# Patient Record
Sex: Female | Born: 1965 | Race: White | Hispanic: No | Marital: Married | Smoking: Never smoker
Health system: Southern US, Community
[De-identification: ages and names within clinical notes are randomized; demographics above are authoritative.]

## PROBLEM LIST (undated history)

## (undated) DIAGNOSIS — I200 Unstable angina: Secondary | ICD-10-CM

## (undated) DIAGNOSIS — J309 Allergic rhinitis, unspecified: Secondary | ICD-10-CM

## (undated) DIAGNOSIS — J45909 Unspecified asthma, uncomplicated: Secondary | ICD-10-CM

## (undated) DIAGNOSIS — E7800 Pure hypercholesterolemia, unspecified: Secondary | ICD-10-CM

## (undated) DIAGNOSIS — Z7982 Long term (current) use of aspirin: Secondary | ICD-10-CM

## (undated) DIAGNOSIS — I10 Essential (primary) hypertension: Secondary | ICD-10-CM

## (undated) DIAGNOSIS — C801 Malignant (primary) neoplasm, unspecified: Secondary | ICD-10-CM

## (undated) DIAGNOSIS — I2510 Atherosclerotic heart disease of native coronary artery without angina pectoris: Secondary | ICD-10-CM

## (undated) DIAGNOSIS — K589 Irritable bowel syndrome without diarrhea: Secondary | ICD-10-CM

## (undated) DIAGNOSIS — E119 Type 2 diabetes mellitus without complications: Secondary | ICD-10-CM

## (undated) DIAGNOSIS — T8859XA Other complications of anesthesia, initial encounter: Secondary | ICD-10-CM

## (undated) DIAGNOSIS — K219 Gastro-esophageal reflux disease without esophagitis: Secondary | ICD-10-CM

## (undated) DIAGNOSIS — Z7902 Long term (current) use of antithrombotics/antiplatelets: Secondary | ICD-10-CM

## (undated) DIAGNOSIS — Z9289 Personal history of other medical treatment: Secondary | ICD-10-CM

## (undated) HISTORY — PX: ESOPHAGOGASTRODUODENOSCOPY: SHX1529

## (undated) HISTORY — PX: CORONARY ANGIOPLASTY: SHX604

## (undated) HISTORY — PX: CARDIAC CATHETERIZATION: SHX172

## (undated) HISTORY — DX: Irritable bowel syndrome, unspecified: K58.9

## (undated) HISTORY — PX: DILATION AND CURETTAGE OF UTERUS: SHX78

## (undated) HISTORY — DX: Allergic rhinitis, unspecified: J30.9

## (undated) HISTORY — DX: Pure hypercholesterolemia, unspecified: E78.00

## (undated) HISTORY — DX: Type 2 diabetes mellitus without complications: E11.9

## (undated) HISTORY — DX: Personal history of other medical treatment: Z92.89

## (undated) HISTORY — DX: Unspecified asthma, uncomplicated: J45.909

---

## 1999-12-17 HISTORY — PX: COLONOSCOPY: SHX174

## 2009-02-09 ENCOUNTER — Emergency Department: Payer: Self-pay | Admitting: Emergency Medicine

## 2009-02-13 ENCOUNTER — Ambulatory Visit: Payer: Self-pay | Admitting: Cardiovascular Disease

## 2013-12-16 HISTORY — PX: EAR CYST EXCISION: SHX22

## 2014-02-21 ENCOUNTER — Ambulatory Visit: Payer: Self-pay | Admitting: Unknown Physician Specialty

## 2015-04-11 DIAGNOSIS — E7800 Pure hypercholesterolemia, unspecified: Secondary | ICD-10-CM | POA: Insufficient documentation

## 2015-04-11 DIAGNOSIS — I2510 Atherosclerotic heart disease of native coronary artery without angina pectoris: Secondary | ICD-10-CM | POA: Insufficient documentation

## 2015-04-11 DIAGNOSIS — J302 Other seasonal allergic rhinitis: Secondary | ICD-10-CM | POA: Insufficient documentation

## 2015-10-02 ENCOUNTER — Other Ambulatory Visit: Payer: Self-pay | Admitting: Family Medicine

## 2015-10-02 DIAGNOSIS — D739 Disease of spleen, unspecified: Principal | ICD-10-CM

## 2015-10-02 DIAGNOSIS — D7389 Other diseases of spleen: Secondary | ICD-10-CM

## 2015-10-05 ENCOUNTER — Ambulatory Visit
Admission: RE | Admit: 2015-10-05 | Discharge: 2015-10-05 | Disposition: A | Discharge status: Home / Self Care | Payer: BC Managed Care – PPO | Source: Ambulatory Visit | Attending: Family Medicine | Admitting: Family Medicine

## 2015-10-05 DIAGNOSIS — D739 Disease of spleen, unspecified: Secondary | ICD-10-CM | POA: Insufficient documentation

## 2015-10-05 DIAGNOSIS — D7389 Other diseases of spleen: Secondary | ICD-10-CM

## 2015-10-05 MED ORDER — IOHEXOL 300 MG/ML  SOLN
100.0000 mL | Freq: Once | INTRAMUSCULAR | Status: AC | PRN
  Administered 2015-10-05: 100 mL via INTRAVENOUS

## 2016-02-22 ENCOUNTER — Encounter: Payer: Self-pay | Admitting: *Deleted

## 2016-02-22 ENCOUNTER — Other Ambulatory Visit: Payer: BC Managed Care – PPO

## 2016-02-22 NOTE — Patient Instructions (Signed)
  Your procedure is scheduled on: 02-29-16 (THURSDAY) Report to [place] To find out your arrival time please call [phone] between 1PM - 3PM on 02-29-16 [facility])  Remember: Instructions that are not followed completely may result in serious medical risk, up to and including death, or upon the discretion of your surgeon and anesthesiologist your surgery may need to be rescheduled.    _X___ 1. Do not eat food or drink liquids after midnight. No gum chewing or hard candies.     _X___ 2. No Alcohol for 24 hours before or after surgery.   ____ 3. Bring all medications with you on the day of surgery if instructed.    _X___ 4. Notify your doctor if there is any change in your medical condition     (cold, fever, infections).     Do not wear jewelry, make-up, hairpins, clips or nail polish.  Do not wear lotions, powders, or perfumes. You may wear deodorant.  Do not shave 48 hours prior to surgery. Men may shave face and neck.  Do not bring valuables to the hospital.    [facility] is not responsible for any belongings or valuables.               Contacts, dentures or bridgework may not be worn into surgery.  Leave your suitcase in the car. After surgery it may be brought to your room.  For patients admitted to the hospital, discharge time is determined by your treatment team.   Patients discharged the day of surgery will not be allowed to drive home.   Please read over the following fact sheets that you were given:     _X___ Take these medicines the morning of surgery with A SIP OF WATER:    1. PRILOSEC (OMEPRAZOLE)  2. TAKE A PRILOSEC ON Wednesday NIGHT(02-28-16) BEFORE BED  3.   4.  5.  6.  ____ Fleet Enema (as directed)   ____ Use CHG Soap as directed  ____ Use inhalers on the day of surgery  ____ Stop metformin 2 days prior to surgery    ____ Take 1/2 of usual insulin dose the night before surgery and none on the morning of surgery.    ____ Stop Coumadin/Plavix/aspirin-ASK DR Earlyne Iba KHAN ABOUT ASPIRIN   ____ Stop Anti-inflammatories-NO NSAIDS OR ASPIRIN PRODUCTS-TYLENOL OK TO TAKE   __X__ Stop supplements until after surgery-STOP FISH OIL NOW   ____ Bring C-Pap to the hospital.

## 2016-02-27 ENCOUNTER — Encounter
Admission: RE | Admit: 2016-02-27 | Discharge: 2016-02-27 | Disposition: A | Discharge status: Home / Self Care | Payer: BC Managed Care – PPO | Source: Ambulatory Visit | Attending: Obstetrics & Gynecology | Admitting: Obstetrics & Gynecology

## 2016-02-27 DIAGNOSIS — I2510 Atherosclerotic heart disease of native coronary artery without angina pectoris: Secondary | ICD-10-CM | POA: Diagnosis not present

## 2016-02-27 DIAGNOSIS — Z883 Allergy status to other anti-infective agents status: Secondary | ICD-10-CM | POA: Diagnosis not present

## 2016-02-27 DIAGNOSIS — N938 Other specified abnormal uterine and vaginal bleeding: Secondary | ICD-10-CM | POA: Diagnosis present

## 2016-02-27 DIAGNOSIS — Z79899 Other long term (current) drug therapy: Secondary | ICD-10-CM | POA: Diagnosis not present

## 2016-02-27 DIAGNOSIS — J45909 Unspecified asthma, uncomplicated: Secondary | ICD-10-CM | POA: Diagnosis not present

## 2016-02-27 DIAGNOSIS — Z30430 Encounter for insertion of intrauterine contraceptive device: Secondary | ICD-10-CM | POA: Diagnosis not present

## 2016-02-27 DIAGNOSIS — I10 Essential (primary) hypertension: Secondary | ICD-10-CM | POA: Diagnosis not present

## 2016-02-27 DIAGNOSIS — E7800 Pure hypercholesterolemia, unspecified: Secondary | ICD-10-CM | POA: Diagnosis not present

## 2016-02-27 DIAGNOSIS — K219 Gastro-esophageal reflux disease without esophagitis: Secondary | ICD-10-CM | POA: Diagnosis not present

## 2016-02-27 DIAGNOSIS — N840 Polyp of corpus uteri: Secondary | ICD-10-CM | POA: Diagnosis not present

## 2016-02-27 DIAGNOSIS — K589 Irritable bowel syndrome without diarrhea: Secondary | ICD-10-CM | POA: Diagnosis not present

## 2016-02-27 DIAGNOSIS — Z7982 Long term (current) use of aspirin: Secondary | ICD-10-CM | POA: Diagnosis not present

## 2016-02-27 LAB — BASIC METABOLIC PANEL
Anion gap: 8 (ref 5–15)
BUN: 8 mg/dL (ref 6–20)
CALCIUM: 8.7 mg/dL — AB (ref 8.9–10.3)
CO2: 20 mmol/L — AB (ref 22–32)
CREATININE: 0.56 mg/dL (ref 0.44–1.00)
Chloride: 108 mmol/L (ref 101–111)
GFR calc non Af Amer: >60 mL/min (ref >60)
Glucose, Bld: 89 mg/dL (ref 65–99)
Potassium: 3.3 mmol/L — ABNORMAL LOW (ref 3.5–5.1)
Sodium: 136 mmol/L (ref 135–145)

## 2016-02-27 LAB — CBC
HCT: 38.3 % (ref 35.0–47.0)
Hemoglobin: 12.9 g/dL (ref 12.0–16.0)
MCH: 26.9 pg (ref 26.0–34.0)
MCHC: 33.7 g/dL (ref 32.0–36.0)
MCV: 79.8 fL — ABNORMAL LOW (ref 80.0–100.0)
Platelets: 237 10*3/uL (ref 150–440)
RBC: 4.79 MIL/uL (ref 3.80–5.20)
RDW: 15.5 % — ABNORMAL HIGH (ref 11.5–14.5)
WBC: 6.3 10*3/uL (ref 3.6–11.0)

## 2016-02-27 LAB — TYPE AND SCREEN
ABO/RH(D): A NEG
Antibody Screen: NEGATIVE

## 2016-02-27 LAB — ABO/RH: ABO/RH(D): A NEG

## 2016-02-28 ENCOUNTER — Encounter: Payer: Self-pay | Admitting: *Deleted

## 2016-02-28 NOTE — Pre-Procedure Instructions (Signed)
FAXED AND CALLED NANCY AT DR WARDS OFFICE LEFT MESSAGE ON VM STATING THAT PT HAD A LOW K+3.3 AND THAT PT NEEDS A SUPPLEMENT-RECEIVED FAX CONFIRMATION THAT IT WAS RECEIVED

## 2016-02-28 NOTE — Pre-Procedure Instructions (Signed)
REQUESTED EKG/ STRESS/ECH AND ANY OTHER CARDIAC TESTS DONE FROM DR Georgia Dom OFFICE- STRESS/ECHO AND NOTE ON CHART

## 2016-02-28 NOTE — Pre-Procedure Instructions (Signed)
DR WARDS OFFICE SENT OVER CARDIAC CLEARANCE NOTE FROM DR Earlyne Iba KHAN-ON CHART-LOW RISK

## 2016-02-28 NOTE — Pre-Procedure Instructions (Signed)
DR WARD CALLED ME AND ASKED MY WHY I WAS FAXING OVER A POTASSIUM RESULT THAT WAS BARELY LOW AND SAYING PT NEEDED A SUPPLEMENT. I TOLD HER THAT WE FAX OVER ANY ABNORMAL K+ THAT IS LESS THAN 3.4 AND MOST SURGEONS ORDER A SUPPLEMENT TO ENSURE THE K+ IS NOT LOW THE MORNING OF SURGERY.  DR WARD SAID SHE WILL CALL PT AND TELL HER TO EAT FOODS TODAY THAT ARE [place] IN K+. I TOLD HER WE WILL RECHECK K+ IN AM.

## 2016-02-29 ENCOUNTER — Encounter
Admission: RE | Disposition: A | Discharge status: Home / Self Care | Payer: Self-pay | Source: Ambulatory Visit | Attending: Obstetrics & Gynecology

## 2016-02-29 ENCOUNTER — Ambulatory Visit
Admission: RE | Admit: 2016-02-29 | Discharge: 2016-02-29 | Disposition: A | Discharge status: Home / Self Care | Payer: BC Managed Care – PPO | Source: Ambulatory Visit | Attending: Obstetrics & Gynecology | Admitting: Obstetrics & Gynecology

## 2016-02-29 ENCOUNTER — Ambulatory Visit: Payer: BC Managed Care – PPO | Admitting: Certified Registered"

## 2016-02-29 DIAGNOSIS — Z883 Allergy status to other anti-infective agents status: Secondary | ICD-10-CM | POA: Insufficient documentation

## 2016-02-29 DIAGNOSIS — J45909 Unspecified asthma, uncomplicated: Secondary | ICD-10-CM | POA: Insufficient documentation

## 2016-02-29 DIAGNOSIS — N840 Polyp of corpus uteri: Secondary | ICD-10-CM | POA: Insufficient documentation

## 2016-02-29 DIAGNOSIS — I10 Essential (primary) hypertension: Secondary | ICD-10-CM | POA: Insufficient documentation

## 2016-02-29 DIAGNOSIS — K219 Gastro-esophageal reflux disease without esophagitis: Secondary | ICD-10-CM | POA: Insufficient documentation

## 2016-02-29 DIAGNOSIS — N938 Other specified abnormal uterine and vaginal bleeding: Secondary | ICD-10-CM | POA: Insufficient documentation

## 2016-02-29 DIAGNOSIS — Z79899 Other long term (current) drug therapy: Secondary | ICD-10-CM | POA: Insufficient documentation

## 2016-02-29 DIAGNOSIS — K589 Irritable bowel syndrome without diarrhea: Secondary | ICD-10-CM | POA: Insufficient documentation

## 2016-02-29 DIAGNOSIS — E7800 Pure hypercholesterolemia, unspecified: Secondary | ICD-10-CM | POA: Insufficient documentation

## 2016-02-29 DIAGNOSIS — Z7982 Long term (current) use of aspirin: Secondary | ICD-10-CM | POA: Insufficient documentation

## 2016-02-29 DIAGNOSIS — I2510 Atherosclerotic heart disease of native coronary artery without angina pectoris: Secondary | ICD-10-CM | POA: Insufficient documentation

## 2016-02-29 DIAGNOSIS — Z30430 Encounter for insertion of intrauterine contraceptive device: Secondary | ICD-10-CM | POA: Insufficient documentation

## 2016-02-29 HISTORY — PX: DILATATION & CURETTAGE/HYSTEROSCOPY WITH MYOSURE: SHX6511

## 2016-02-29 HISTORY — DX: Atherosclerotic heart disease of native coronary artery without angina pectoris: I25.10

## 2016-02-29 HISTORY — DX: Gastro-esophageal reflux disease without esophagitis: K21.9

## 2016-02-29 HISTORY — DX: Essential (primary) hypertension: I10

## 2016-02-29 HISTORY — PX: INTRAUTERINE DEVICE (IUD) INSERTION: SHX5877

## 2016-02-29 LAB — POCT PREGNANCY, URINE: PREG TEST UR: NEGATIVE

## 2016-02-29 LAB — I-STAT 4 (NA, K, GLUC, HGB, HCT): POTASSIUM: 3.7 mmol/L

## 2016-02-29 SURGERY — DILATATION & CURETTAGE/HYSTEROSCOPY WITH MYOSURE
Anesthesia: General

## 2016-02-29 MED ORDER — LIDOCAINE HCL (CARDIAC) 20 MG/ML IV SOLN
INTRAVENOUS | Status: DC | PRN
  Administered 2016-02-29: 80 mg via INTRAVENOUS

## 2016-02-29 MED ORDER — ACETAMINOPHEN 10 MG/ML IV SOLN
INTRAVENOUS | Status: DC | PRN
  Administered 2016-02-29: 1000 mg via INTRAVENOUS

## 2016-02-29 MED ORDER — ONDANSETRON HCL 4 MG/2ML IJ SOLN: 4.0000 mg | Freq: Once | INTRAMUSCULAR | Status: DC | PRN

## 2016-02-29 MED ORDER — HYDRALAZINE HCL 20 MG/ML IJ SOLN
10.0000 mg | Freq: Once | INTRAMUSCULAR | Status: AC
  Administered 2016-02-29: 10 mg via INTRAVENOUS

## 2016-02-29 MED ORDER — DEXAMETHASONE SODIUM PHOSPHATE 10 MG/ML IJ SOLN
INTRAMUSCULAR | Status: DC | PRN
  Administered 2016-02-29: 4 mg via INTRAVENOUS

## 2016-02-29 MED ORDER — HYDRALAZINE HCL 20 MG/ML IJ SOLN
INTRAMUSCULAR | Status: AC
  Filled 2016-02-29: qty 1

## 2016-02-29 MED ORDER — LACTATED RINGERS IV SOLN
INTRAVENOUS | Status: DC
  Administered 2016-02-29 (×2): via INTRAVENOUS

## 2016-02-29 MED ORDER — PROPOFOL 10 MG/ML IV BOLUS
INTRAVENOUS | Status: DC | PRN
  Administered 2016-02-29: 150 mg via INTRAVENOUS

## 2016-02-29 MED ORDER — FENTANYL CITRATE (PF) 100 MCG/2ML IJ SOLN: 25.0000 ug | INTRAMUSCULAR | Status: DC | PRN

## 2016-02-29 MED ORDER — FENTANYL CITRATE (PF) 100 MCG/2ML IJ SOLN
INTRAMUSCULAR | Status: DC | PRN
  Administered 2016-02-29 (×2): 50 ug via INTRAVENOUS

## 2016-02-29 MED ORDER — MORPHINE SULFATE (PF) 2 MG/ML IV SOLN: 1.0000 mg | INTRAVENOUS | Status: DC | PRN

## 2016-02-29 MED ORDER — ACETAMINOPHEN 650 MG RE SUPP: 650.0000 mg | RECTAL | Status: DC | PRN

## 2016-02-29 MED ORDER — IBUPROFEN 600 MG PO TABS: 600.0000 mg | ORAL_TABLET | Freq: Four times a day (QID) | ORAL | Status: AC | PRN

## 2016-02-29 MED ORDER — MIDAZOLAM HCL 2 MG/2ML IJ SOLN
INTRAMUSCULAR | Status: DC | PRN
  Administered 2016-02-29: 2 mg via INTRAVENOUS

## 2016-02-29 MED ORDER — ACETAMINOPHEN 325 MG PO TABS: 650.0000 mg | ORAL_TABLET | ORAL | Status: DC | PRN

## 2016-02-29 MED ORDER — LABETALOL HCL 5 MG/ML IV SOLN
10.0000 mg | Freq: Once | INTRAVENOUS | Status: AC
  Administered 2016-02-29: 10 mg via INTRAVENOUS

## 2016-02-29 MED ORDER — LABETALOL HCL 5 MG/ML IV SOLN
INTRAVENOUS | Status: AC
  Filled 2016-02-29: qty 4

## 2016-02-29 MED ORDER — ACETAMINOPHEN 10 MG/ML IV SOLN
INTRAVENOUS | Status: AC
  Filled 2016-02-29: qty 100

## 2016-02-29 MED ORDER — ONDANSETRON HCL 4 MG/2ML IJ SOLN
INTRAMUSCULAR | Status: DC | PRN
  Administered 2016-02-29: 4 mg via INTRAVENOUS

## 2016-02-29 MED ORDER — KETOROLAC TROMETHAMINE 30 MG/ML IJ SOLN: 30.0000 mg | Freq: Four times a day (QID) | INTRAMUSCULAR | Status: DC

## 2016-02-29 MED ORDER — CHLORHEXIDINE GLUCONATE 4 % EX LIQD: Freq: Once | CUTANEOUS | Status: DC

## 2016-02-29 SURGICAL SUPPLY — 24 items
ABLATOR ENDOMETRIAL MYOSURE (ABLATOR) IMPLANT
CANISTER SUC SOCK COL 7IN (MISCELLANEOUS) ×3 IMPLANT
CATH ROBINSON RED A/P 16FR (CATHETERS) ×3 IMPLANT
ELECT REM PT RETURN 9FT ADLT (ELECTROSURGICAL)
ELECTRODE REM PT RTRN 9FT ADLT (ELECTROSURGICAL) IMPLANT
GLOVE BIOGEL PI IND STRL 6.5 (GLOVE) ×1 IMPLANT
GLOVE BIOGEL PI INDICATOR 6.5 (GLOVE) ×2
GLOVE PI ORTHOPRO 6.5 (GLOVE) ×2
GLOVE PI ORTHOPRO STRL 6.5 (GLOVE) ×1 IMPLANT
GLOVE SURG SYN 6.5 ES PF (GLOVE) ×6 IMPLANT
GOWN STRL REUS W/ TWL LRG LVL3 (GOWN DISPOSABLE) ×2 IMPLANT
GOWN STRL REUS W/TWL LRG LVL3 (GOWN DISPOSABLE) ×4
KIT RM TURNOVER CYSTO AR (KITS) ×3 IMPLANT
MYOSURE LITE POLYP REMOVAL (MISCELLANEOUS) IMPLANT
PACK DNC HYST (MISCELLANEOUS) ×3 IMPLANT
PAD OB MATERNITY 4.3X12.25 (PERSONAL CARE ITEMS) ×3 IMPLANT
PAD PREP 24X41 OB/GYN DISP (PERSONAL CARE ITEMS) ×3 IMPLANT
SEAL ROD LENS SCOPE MYOSURE (ABLATOR) ×3 IMPLANT
SOL .9 NS 3000ML IRR  AL (IV SOLUTION) ×2
SOL .9 NS 3000ML IRR UROMATIC (IV SOLUTION) ×1 IMPLANT
TOWEL OR 17X26 4PK STRL BLUE (TOWEL DISPOSABLE) ×3 IMPLANT
TUBING CONNECTING 10 (TUBING) ×2 IMPLANT
TUBING CONNECTING 10' (TUBING) ×1
TUBING HYSTEROSCOPY DOLPHIN (MISCELLANEOUS) IMPLANT

## 2016-02-29 NOTE — Discharge Instructions (Signed)
You should expect to have some cramping and vaginal bleeding for about a week. This should taper off and subside, much like a period. If heavy bleeding continues or gets worse, you should contact the office for an earlier appointment.  Use Ibuprofen and a heating pad for cramping relief.   Please call the office or physician on call for fever >101, severe pain, and heavy bleeding.   [phone]  NOTHING IN THE VAGINA FOR 2 WEEKS!!

## 2016-02-29 NOTE — H&P (Signed)
H&P Update  PLEASE SEE PAPER H&P  Pt was last seen in my office, and complete history and physical performed.  The surgical history has been reviewed and remains accurate without interval change. The patient was re-examined and patient's physiologic condition has not changed significantly in the last 30 days.  No new pharmacological allergies or types of therapy has been initiated. She has received clearance from her cardiologist for this procedure.  Allergies  Allergen Reactions  . Neosporin Wound Cleanser [Benzalkonium Chloride] Rash    Past Medical History  Diagnosis Date  . Hypertension   . GERD (gastroesophageal reflux disease)   . Coronary artery disease    Past Surgical History  Procedure Laterality Date  . Ear cyst excision      IN OFFICE  . Colonoscopy    . Esophagogastroduodenoscopy      BP 152/96 mmHg  Pulse 82  Temp(Src) 97.4 F (36.3 C) (Oral)  Resp 16  Ht 5' 4.5" (1.638 m)  Wt 75.297 kg (166 lb)  BMI 28.06 kg/m2  SpO2 98%  LMP 01/31/2016 (Approximate)  NAD RRR no murmurs CTAB, no wheezing, resps unlabored +BS, soft, NTTP No c/c/e Pelvic exam deferred  The above history was confirmed with the patient. The condition still exists that makes this procedure necessary. Surgical plan includes Hysteroscopy, Dilation and Curettage, Polypectomy, and Insertion of Intrauterine Device, as confirmed on the consent. The treatment plan remains the same, without new options for care.  The patient understands the potential benefits and risks and the consents have been signed and placed on the chart.     Larey Days, MD Attending Obstetrician Gynecologist [facility]

## 2016-02-29 NOTE — Op Note (Signed)
Operative Report Hysteroscopy, Dilation and Curettage 02/29/2016  Patient:  Zoe [name]  50 y.o. female Preoperative diagnosis:  ENDOMETRIAL POLYP,ABNORMAL UTERINE BLEEDING,CONTRACEPTION Postoperative diagnosis:  ENDOMETRIAL POLYP, ABNORMAL UTERINE BLEEDING, CONTRACEPTION  PROCEDURE:  Procedure(s): DILATATION & CURETTAGE/HYSTEROSCOPY WITH MYOSURE (N/A) INTRAUTERINE DEVICE (IUD) INSERTION (N/A)  POLYPECTOMY Surgeon:  Surgeon(s) and Role:    * Chelsea Loletha Grayer Ward, MD - Primary Assistant: N/A Anesthesia:  LMA I/O: Total I/O In: 500 [I.V.:500] Out: - MINIMAL . Specimens:  Endometrial curettings, Endometrial polyp Complications: None Apparent Disposition:  VS stable to PACU  Findings: Findings:  Small retroflexed uterus sounding to 9cm, mobile, no adnexal masses, ectropion on posterior lip of cervix, endometrial polyps  Indication for procedure/Consents: 50 y.o.  here for scheduled surgery for the aforementioned diagnoses. Risks of surgery were discussed with the patient including but not limited to: bleeding which may require transfusion; infection which may require antibiotics; injury to uterus or surrounding organs; intrauterine scarring which may impair future fertility; need for additional procedures including laparotomy or laparoscopy; and other postoperative/anesthesia complications. Written informed consent was obtained.    Procedure Details:   The patient was then taken to the operating room where anesthesia was administered.  After a formal timeout was performed, she was placed in the dorsal lithotomy position and examined with the above findings. She was then prepped and draped in the sterile manner.  A speculum was then placed in the patient's vagina and a single tooth tenaculum was applied to the anterior lip of the cervix.  The uterus was sounded to 9cm. Her cervix was serially dilated to accommodate the myoscope with findings as above. Forceps were inserted and a large, broad  based polyp was removed.  A sharp curettage was then performed until there was a gritty texture in all four quadrants. The specimens were handed off to nursing.  The camera was reinserted and confirmed the uterus had been evacuated.   The [place] IUD insertion apparatus was then placed in the uterus to the fundus and the IUD was inserted without difficulty using proper technique.  The strings were cut to 2cm out of the cervix.  The tenaculum was removed from the anterior lip of the cervix and the vaginal speculum was removed after noting good hemostasis. The patient tolerated the procedure well and was taken to the recovery area awake, extubated and in stable condition.  The patient will be discharged to home as per PACU criteria.  Routine postoperative instructions given. She will follow up in the clinic in four weeks for postoperative evaluation.  Larey Days, MD [facility] OBGYN Attending Gynecologist

## 2016-02-29 NOTE — Progress Notes (Signed)
Hydralazine 10mg  given for blood pressure of 163/107

## 2016-02-29 NOTE — Transfer of Care (Signed)
Immediate Anesthesia Transfer of Care Note  Patient: Zoe [name]  Procedure(s) Performed: Procedure(s): DILATATION & CURETTAGE/HYSTEROSCOPY WITH MYOSURE (N/A) INTRAUTERINE DEVICE (IUD) INSERTION (N/A)  Patient Location: PACU  Anesthesia Type:General  Level of Consciousness: sedated  Airway & Oxygen Therapy: Patient Spontanous Breathing and Patient connected to face mask oxygen  Post-op Assessment: Report given to RN and Post -op Vital signs reviewed and stable  Post vital signs: Reviewed and stable  Last Vitals:  Filed Vitals:   02/29/16 1046  BP: 152/96  Pulse: 82  Temp: 36.3 C  Resp: 16    Complications: No apparent anesthesia complications

## 2016-02-29 NOTE — Progress Notes (Signed)
Labetalol 10mg  given for blood pressure of 164/103

## 2016-02-29 NOTE — Progress Notes (Signed)
Blood pressure 163/97  Dr Kayleen Memos aware no new orders

## 2016-02-29 NOTE — Anesthesia Procedure Notes (Signed)
Procedure Name: LMA Insertion Performed by: Trenton Verne Pre-anesthesia Checklist: Patient identified, Emergency Drugs available, Suction available, Patient being monitored and Timeout performed Patient Re-evaluated:Patient Re-evaluated prior to inductionOxygen Delivery Method: Circle system utilized Preoxygenation: Pre-oxygenation with 100% oxygen Intubation Type: IV induction Ventilation: Mask ventilation without difficulty LMA: LMA inserted LMA Size: 3.5 Number of attempts: 1 Tube secured with: Tape Dental Injury: Teeth and Oropharynx as per pre-operative assessment      

## 2016-02-29 NOTE — Anesthesia Preprocedure Evaluation (Signed)
Anesthesia Evaluation  Patient identified by MRN, date of birth, ID band Patient awake    Reviewed: Allergy & Precautions, H&P , NPO status , Patient's Chart, lab work & pertinent test results, reviewed documented beta blocker date and time   Airway Mallampati: III  TM Distance: >3 FB Neck ROM: full    Dental  (+) Teeth Intact   Pulmonary neg pulmonary ROS,    Pulmonary exam normal        Cardiovascular Exercise Tolerance: Good hypertension, (-) angina+ CAD  (-) Past MI, (-) Cardiac Stents, (-) CHF, (-) PND and (-) DOE negative cardio ROS Normal cardiovascular exam(-) dysrhythmias  Rate:Normal  Single vessel dz on cath.  Good exercise tolerance.  Hiked 2 miles recently.Marland KitchenMarland KitchenOn meds.  JA   Neuro/Psych negative neurological ROS  negative psych ROS   GI/Hepatic negative GI ROS, Neg liver ROS,   Endo/Other  negative endocrine ROS  Renal/GU negative Renal ROS  negative genitourinary   Musculoskeletal   Abdominal   Peds  Hematology negative hematology ROS (+)   Anesthesia Other Findings   Reproductive/Obstetrics negative OB ROS                             Anesthesia Physical Anesthesia Plan  ASA: III  Anesthesia Plan: General LMA   Post-op Pain Management:    Induction:   Airway Management Planned:   Additional Equipment:   Intra-op Plan:   Post-operative Plan:   Informed Consent: I have reviewed the patients History and Physical, chart, labs and discussed the procedure including the risks, benefits and alternatives for the proposed anesthesia with the patient or authorized representative who has indicated his/her understanding and acceptance.     Plan Discussed with: CRNA  Anesthesia Plan Comments:         Anesthesia Quick Evaluation

## 2016-02-29 NOTE — OR Nursing (Signed)
Liletta IUD [id number] Exp:  08/2019

## 2016-03-01 ENCOUNTER — Encounter: Payer: Self-pay | Admitting: Obstetrics & Gynecology

## 2016-03-01 LAB — SURGICAL PATHOLOGY

## 2016-03-01 NOTE — Anesthesia Postprocedure Evaluation (Signed)
Anesthesia Post Note  Patient: Steva Mulanax  Procedure(s) Performed: Procedure(s) (LRB): DILATATION & CURETTAGE/HYSTEROSCOPY WITH MYOSURE (N/A) INTRAUTERINE DEVICE (IUD) INSERTION (N/A)  Patient location during evaluation: PACU Anesthesia Type: General Level of consciousness: awake and alert and oriented Pain management: pain level controlled Vital Signs Assessment: post-procedure vital signs reviewed and stable Respiratory status: spontaneous breathing Cardiovascular status: blood pressure returned to baseline Anesthetic complications: no    Last Vitals:  Filed Vitals:   02/29/16 1410 02/29/16 1430  BP: 163/97 141/78  Pulse: 76 74  Temp: 36.8 C   Resp: 18 16    Last Pain: There were no vitals filed for this visit.               Danilynn Jemison

## 2017-07-07 ENCOUNTER — Ambulatory Visit (INDEPENDENT_AMBULATORY_CARE_PROVIDER_SITE_OTHER): Payer: BC Managed Care – PPO | Admitting: Certified Nurse Midwife

## 2017-07-07 ENCOUNTER — Encounter: Payer: Self-pay | Admitting: Certified Nurse Midwife

## 2017-07-07 VITALS — BP 110/70 | HR 70 | Ht 64.0 in | Wt 169.0 lb

## 2017-07-07 DIAGNOSIS — Z1231 Encounter for screening mammogram for malignant neoplasm of breast: Secondary | ICD-10-CM

## 2017-07-07 DIAGNOSIS — Z124 Encounter for screening for malignant neoplasm of cervix: Secondary | ICD-10-CM | POA: Diagnosis not present

## 2017-07-07 DIAGNOSIS — Z1211 Encounter for screening for malignant neoplasm of colon: Secondary | ICD-10-CM

## 2017-07-07 DIAGNOSIS — Z01419 Encounter for gynecological examination (general) (routine) without abnormal findings: Secondary | ICD-10-CM

## 2017-07-07 DIAGNOSIS — K589 Irritable bowel syndrome without diarrhea: Secondary | ICD-10-CM | POA: Insufficient documentation

## 2017-07-07 DIAGNOSIS — E7800 Pure hypercholesterolemia, unspecified: Secondary | ICD-10-CM | POA: Insufficient documentation

## 2017-07-07 DIAGNOSIS — I10 Essential (primary) hypertension: Secondary | ICD-10-CM | POA: Insufficient documentation

## 2017-07-07 DIAGNOSIS — K219 Gastro-esophageal reflux disease without esophagitis: Secondary | ICD-10-CM | POA: Insufficient documentation

## 2017-07-07 DIAGNOSIS — J309 Allergic rhinitis, unspecified: Secondary | ICD-10-CM | POA: Insufficient documentation

## 2017-07-07 DIAGNOSIS — E1159 Type 2 diabetes mellitus with other circulatory complications: Secondary | ICD-10-CM | POA: Insufficient documentation

## 2017-07-07 DIAGNOSIS — Z1239 Encounter for other screening for malignant neoplasm of breast: Secondary | ICD-10-CM

## 2017-07-07 DIAGNOSIS — I2510 Atherosclerotic heart disease of native coronary artery without angina pectoris: Secondary | ICD-10-CM | POA: Insufficient documentation

## 2017-07-07 LAB — HEMOCCULT GUIAC POC 1CARD (OFFICE): Fecal Occult Blood, POC: NEGATIVE

## 2017-07-07 NOTE — Progress Notes (Signed)
Gynecology Annual Exam  PCP: Derinda Late, MD  Chief Complaint:  Chief Complaint  Patient presents with  . Gynecologic Exam    History of Present Illness:Zoe [name] is a 51 year old Caucasian/White female, G2 P2002, who presents for her annual exam. She is not having any significant gyn concerns. She is amenorrheic on the  [place] IUD inserted 316/17. She had a hysteroscopy/ D&C for menorrhagia and endometrial polyps. The IUD was inserted at that time.     The patient's past medical history is coronary artery disease, hyperlipidemia, and GERD. She sees Dr Laurelyn Sickle q58mos and Zoe [name] is her PCP.  Since her last annual GYN exam dated 08/25/2015, she has not had any significant changes in her health. Her antihypertensive medications have been changed for better blood pressure control.  She is sexually active. She is currently using the [place] IUD for contraception. She has no dyspareunia. Her most recent pap smear was obtained 08/25/2015 and was negative.  Her most recent mammogram obtained on 08/25/2015 was normal.  There is no family history of breast cancer.  There is no family history of ovarian cancer.  The patient does not do monthly self breast exams.  The patient does not smoke.  The patient does drink alcohol occasionally. The patient does not use illegal drugs.  The patient exercises by walking 45 minutes three times a week. The patient does get adequate calcium in her diet.  She had a normal lipid panel in 2018.  The patient denies current symptoms of depression.    Review of Systems: Review of Systems  Constitutional: Negative for chills, fever and weight loss.  HENT: Negative for congestion, sinus pain and sore throat.   Eyes: Negative for blurred vision and pain.  Respiratory: Negative for hemoptysis, shortness of breath and wheezing.   Cardiovascular: Negative for chest pain, palpitations and leg swelling.  Gastrointestinal: Negative for abdominal  pain, blood in stool, diarrhea, heartburn, nausea and vomiting.  Genitourinary: Negative for dysuria, frequency, hematuria and urgency.  Musculoskeletal: Negative for back pain, joint pain and myalgias.  Skin: Negative for itching and rash.  Neurological: Negative for dizziness, tingling and headaches.  Endo/Heme/Allergies: Negative for environmental allergies and polydipsia. Does not bruise/bleed easily.       Negative for hirsutism   Psychiatric/Behavioral: Negative for depression. The patient is not nervous/anxious and does not have insomnia.     Past Medical History:  Past Medical History:  Diagnosis Date  . Allergic rhinitis   . Coronary artery disease   . GERD (gastroesophageal reflux disease)   . Hypercholesteremia   . Hypertension   . IBS (irritable bowel syndrome)     Past Surgical History:  Past Surgical History:  Procedure Laterality Date  . CARDIAC CATHETERIZATION    . COLONOSCOPY  2001  . DILATATION & CURETTAGE/HYSTEROSCOPY WITH MYOSURE N/A 02/29/2016   Procedure: DILATATION & CURETTAGE/HYSTEROSCOPY WITH MYOSURE;  Surgeon: Honor Loh Ward, MD;  Location: [facility] ORS;  Service: Gynecology;  Laterality: N/A;  . EAR CYST EXCISION     IN OFFICE  . ESOPHAGOGASTRODUODENOSCOPY    . INTRAUTERINE DEVICE (IUD) INSERTION N/A 02/29/2016   Procedure: INTRAUTERINE DEVICE (IUD) INSERTION;  Surgeon: Honor Loh Ward, MD;  Location: [facility] ORS;  Service: Gynecology;  Laterality: N/A;    Family History:  Family History  Problem Relation Age of Onset  . Hyperlipidemia Mother   . Hypertension Mother   . Heart disease Father        died  from either MI or stroke  . Diabetes Brother   . Heart disease Paternal Grandfather   . Cancer Neg Hx     Social History:  Social History   Social History  . Marital status: Married    Spouse name: N/A  . Number of children: 2  . Years of education: N/A   Occupational History  . Teacher    Social History Main Topics  . Smoking status: Never  Smoker  . Smokeless tobacco: Never Used  . Alcohol use Yes     Comment: occasionally  . Drug use: No  . Sexual activity: Yes    Partners: Male    Birth control/ protection: IUD   Other Topics Concern  . Not on file   Social History Narrative  . No narrative on file    Allergies:  Allergies  Allergen Reactions  . Neosporin Wound Cleanser [Benzalkonium Chloride] Rash and Other (See Comments)    Swelling / redness    Medications: Prior to Admission medications   Medication Sig Start Date End Date Taking? Authorizing Provider  aspirin 81 MG tablet Take 81 mg by mouth daily.    [provider]  fexofenadine (ALLEGRA) 60 MG tablet Take 60 mg by mouth 2 (two) times daily as needed for allergies or rhinitis.    [provider]  ibuprofen (ADVIL,MOTRIN) 600 MG tablet Take 1 tablet (600 mg total) by mouth every 6 (six) hours as needed. 02/29/16   Ward, Honor Loh, MD  losartan (COZAAR) 50 MG tablet Take 50 mg by mouth at bedtime.    [provider]  Multiple Vitamin (MULTIVITAMIN) tablet Take 1 tablet by mouth daily.    [provider]  norethindrone (AYGESTIN) 5 MG tablet Take 5 mg by mouth daily.    [provider]  Omega-3 Fatty Acids (FISH OIL PO) Take 1 tablet by mouth daily.    [provider]  omeprazole (PRILOSEC) 20 MG capsule Take 20 mg by mouth as needed.    [provider]  rosuvastatin (CRESTOR) 40 MG tablet Take 40 mg by mouth at bedtime.    [provider]  triamcinolone (NASACORT ALLERGY 24HR) 55 MCG/ACT AERO nasal inhaler Place 2 sprays into the nose 2 (two) times daily as needed. Reported on 02/29/2016    [provider]    Physical Exam Vitals: BP 110/70   Pulse 70   Ht 5\' 4"  (1.626 m)   Wt 169 lb (76.7 kg)   BMI 29.01 kg/m   General: NAD HEENT: normocephalic, anicteric Neck: no thyroid enlargement, no palpable nodules, no cervical lymphadenopathy  Pulmonary: No increased work of  breathing, CTAB Cardiovascular: RRR, without murmur  Breast: Breast symmetrical, no tenderness, no palpable nodules or masses, no skin or nipple retraction present, no nipple discharge. Nipples chronically turned up bilaterally. No axillary, infraclavicular or supraclavicular lymphadenopathy. Abdomen: Soft, non-tender, non-distended.  Umbilicus without lesions.  No hepatomegaly or masses palpable. No evidence of hernia. Genitourinary:  External: Normal external female genitalia.  Normal urethral meatus, normal  Bartholin's and Skene's glands.    Vagina: Normal vaginal mucosa, no evidence of prolapse.    Cervix: Grossly normal in appearance, no bleeding, non-tender  Uterus: Retroflexed and levorotated, normal size, shape, and consistency, mobile, and non-tender  Adnexa: No adnexal masses, non-tender  Rectal: no masses, hemoccult negative  Lymphatic: no evidence of inguinal lymphadenopathy Extremities: no edema, erythema, or tenderness Neurologic: Grossly intact Psychiatric: mood appropriate, affect full     Assessment: 51 y.o.  annual gyn exam  Plan:   1) Breast cancer screening - recommend monthly self breast exam and annual mammograms. Mammogram was ordered today. Patient to call for an appointment.  2) Colon cancer screening- discussed new guidelines changing colon cancer screening age to 49 yo. Explained options for colon cancer screening and patient desires to do annual hemoccult testing this year. Hemoccult today was negative.  3) Cervical cancer screening - Pap was done. ASCCP guidelines and rational discussed.  Patient opts for yearly screening interval  4) Contraception - [place] now approved for 5 years use.  5) Routine healthcare maintenance including cholesterol and diabetes screening managed by PCP   Dalia Heading, CNM

## 2017-07-10 LAB — IGP, APTIMA HPV
HPV APTIMA: NEGATIVE
PAP SMEAR COMMENT: 0

## 2017-07-21 ENCOUNTER — Ambulatory Visit
Admission: RE | Admit: 2017-07-21 | Discharge: 2017-07-21 | Disposition: A | Discharge status: Home / Self Care | Payer: BC Managed Care – PPO | Source: Ambulatory Visit | Attending: Certified Nurse Midwife | Admitting: Certified Nurse Midwife

## 2017-07-21 DIAGNOSIS — Z1231 Encounter for screening mammogram for malignant neoplasm of breast: Secondary | ICD-10-CM | POA: Diagnosis present

## 2017-07-21 DIAGNOSIS — Z1239 Encounter for other screening for malignant neoplasm of breast: Secondary | ICD-10-CM

## 2017-07-24 ENCOUNTER — Other Ambulatory Visit: Payer: Self-pay | Admitting: *Deleted

## 2017-07-24 ENCOUNTER — Inpatient Hospital Stay
Admission: RE | Admit: 2017-07-24 | Discharge: 2017-07-24 | Disposition: A | Discharge status: Home / Self Care | Payer: Self-pay | Source: Ambulatory Visit | Attending: *Deleted | Admitting: *Deleted

## 2017-07-24 DIAGNOSIS — Z9289 Personal history of other medical treatment: Secondary | ICD-10-CM

## 2017-07-25 ENCOUNTER — Other Ambulatory Visit: Payer: Self-pay | Admitting: Certified Nurse Midwife

## 2017-07-25 DIAGNOSIS — R928 Other abnormal and inconclusive findings on diagnostic imaging of breast: Secondary | ICD-10-CM

## 2017-08-08 ENCOUNTER — Ambulatory Visit
Admission: RE | Admit: 2017-08-08 | Discharge: 2017-08-08 | Disposition: A | Discharge status: Home / Self Care | Payer: BC Managed Care – PPO | Source: Ambulatory Visit | Attending: Certified Nurse Midwife | Admitting: Certified Nurse Midwife

## 2017-08-08 DIAGNOSIS — R921 Mammographic calcification found on diagnostic imaging of breast: Secondary | ICD-10-CM | POA: Insufficient documentation

## 2017-08-08 DIAGNOSIS — R928 Other abnormal and inconclusive findings on diagnostic imaging of breast: Secondary | ICD-10-CM

## 2018-07-07 ENCOUNTER — Other Ambulatory Visit: Payer: Self-pay | Admitting: Certified Nurse Midwife

## 2018-07-07 DIAGNOSIS — Z1231 Encounter for screening mammogram for malignant neoplasm of breast: Secondary | ICD-10-CM

## 2018-07-22 ENCOUNTER — Ambulatory Visit
Admission: RE | Admit: 2018-07-22 | Discharge: 2018-07-22 | Disposition: A | Discharge status: Home / Self Care | Payer: BC Managed Care – PPO | Source: Ambulatory Visit | Attending: Certified Nurse Midwife | Admitting: Certified Nurse Midwife

## 2018-07-22 DIAGNOSIS — Z1231 Encounter for screening mammogram for malignant neoplasm of breast: Secondary | ICD-10-CM | POA: Insufficient documentation

## 2018-08-11 ENCOUNTER — Ambulatory Visit (INDEPENDENT_AMBULATORY_CARE_PROVIDER_SITE_OTHER): Payer: BC Managed Care – PPO | Admitting: Certified Nurse Midwife

## 2018-08-11 ENCOUNTER — Other Ambulatory Visit (HOSPITAL_COMMUNITY)
Admission: RE | Admit: 2018-08-11 | Discharge: 2018-08-11 | Disposition: A | Discharge status: Home / Self Care | Payer: BC Managed Care – PPO | Source: Ambulatory Visit | Attending: Obstetrics and Gynecology | Admitting: Obstetrics and Gynecology

## 2018-08-11 ENCOUNTER — Encounter: Payer: Self-pay | Admitting: Certified Nurse Midwife

## 2018-08-11 VITALS — BP 90/60 | HR 60 | Ht 64.0 in | Wt 156.0 lb

## 2018-08-11 DIAGNOSIS — Z01419 Encounter for gynecological examination (general) (routine) without abnormal findings: Secondary | ICD-10-CM | POA: Diagnosis not present

## 2018-08-11 DIAGNOSIS — Z1211 Encounter for screening for malignant neoplasm of colon: Secondary | ICD-10-CM | POA: Diagnosis not present

## 2018-08-11 DIAGNOSIS — Z124 Encounter for screening for malignant neoplasm of cervix: Secondary | ICD-10-CM

## 2018-08-11 LAB — HEMOCCULT GUIAC POC 1CARD (OFFICE): Fecal Occult Blood, POC: NEGATIVE

## 2018-08-11 NOTE — Progress Notes (Addendum)
Gynecology Annual Exam  PCP: Derinda Late, MD  Chief Complaint:  Chief Complaint  Patient presents with  . Gynecologic Exam    History of Present Illness:Zoe [name] is a 52 year old Caucasian/White female, G2 P2002, who presents for her annual exam. She is not having any significant gyn concerns. She is amenorrheic on the  [place] IUD inserted 316/17. She had a hysteroscopy/ D&C for menorrhagia and endometrial polyps. The IUD was inserted at that time.  She denies hot flashes.   The patient's past medical history is coronary artery disease, hypertension, asthma, hyperlipidemia, and GERD. She sees Dr Laurelyn Sickle q45mos and Dr Loney Hering is her PCP.  Since her last annual GYN exam dated 07/07/2017, she has not had any significant changes in her health. Her antihypertensive medications have been changed for better blood pressure control.  She is sexually active. She is currently using the [place] IUD for contraception. She has no dyspareunia. Her most recent pap smear was obtained 07/07/2017 and was NIL/negative HRHPV Her most recent mammogram obtained on 07/22/2018 was normal.  There is no family history of breast cancer.  There is no family history of ovarian cancer.  The patient does not do monthly self breast exams.  The patient does not smoke.  The patient does drink alcohol occasionally. The patient does not use illegal drugs.  The patient exercises by walking 1 hour most days of the  week. The patient does get adequate calcium in her diet.  She had a abnormal lipid panel in 2019.  The patient denies current symptoms of depression.    Review of Systems: Review of Systems  Constitutional: Negative for chills, fever and weight loss.  HENT: Negative for congestion, sinus pain and sore throat.   Eyes: Negative for blurred vision and pain.  Respiratory: Negative for hemoptysis, shortness of breath and wheezing.   Cardiovascular: Negative for chest pain, palpitations and leg  swelling.  Gastrointestinal: Negative for abdominal pain, blood in stool, diarrhea, heartburn, nausea and vomiting.  Genitourinary: Negative for dysuria, frequency, hematuria and urgency.  Musculoskeletal: Negative for back pain, joint pain and myalgias.  Skin: Negative for itching and rash.  Neurological: Negative for dizziness, tingling and headaches.  Endo/Heme/Allergies: Negative for environmental allergies and polydipsia. Does not bruise/bleed easily.       Negative for hirsutism   Psychiatric/Behavioral: Negative for depression. The patient is not nervous/anxious and does not have insomnia.     Past Medical History:  Past Medical History:  Diagnosis Date  . Allergic rhinitis   . Asthma   . Coronary artery disease   . GERD (gastroesophageal reflux disease)   . History of mammogram 07/18/10; 08/25/15   neg; neg  . History of Papanicolaou smear of cervix 07/2013; 08/25/15   -/-; neg  . Hypercholesteremia   . Hypertension   . IBS (irritable bowel syndrome)     Past Surgical History:  Past Surgical History:  Procedure Laterality Date  . CARDIAC CATHETERIZATION    . COLONOSCOPY  2001  . DILATATION & CURETTAGE/HYSTEROSCOPY WITH MYOSURE N/A 02/29/2016   Procedure: DILATATION & CURETTAGE/HYSTEROSCOPY WITH MYOSURE;  Surgeon: Honor Loh Ward, MD;  Location: [facility] ORS;  Service: Gynecology;  Laterality: N/A;  . EAR CYST EXCISION Left 2015   IN OFFICE  . ESOPHAGOGASTRODUODENOSCOPY    . INTRAUTERINE DEVICE (IUD) INSERTION N/A 02/29/2016   Procedure: INTRAUTERINE DEVICE (IUD) INSERTION;  Surgeon: Honor Loh Ward, MD;  Location: [facility] ORS;  Service: Gynecology;  Laterality: N/A;  Family History:  Family History  Problem Relation Age of Onset  . Hyperlipidemia Mother   . Hypertension Mother   . Heart disease Father        died from either MI or stroke  . Diabetes Brother        TYPE 2  . Heart disease Paternal Grandfather   . Diabetes Other        TYPE 2  . Cancer Neg Hx   . Breast  cancer Neg Hx     Social History:  Social History   Socioeconomic History  . Marital status: Married    Spouse name: Not on file  . Number of children: 2  . Years of education: 98  . Highest education level: Not on file  Occupational History  . Occupation: Teacher    Comment: [employer] - 2[employer] GR[employer]  Social Needs  . Financial resource strain: Not on file  . Food insecurity:    Worry: Not on file    Inability: Not on file  . Transportation needs:    Medical: Not on file    Non-medical: Not on file  Tobacco Use  . Smoking status: Never Smoker  . Smokeless tobacco: Never Used  Substance and Sexual Activity  . Alcohol use: Yes    Comment: occasionally  . Drug use: No  . Sexual activity: Yes    Partners: Male    Birth control/protection: IUD  Lifestyle  . Physical activity:    Days per week: Not on file    Minutes per session: Not on file  . Stress: Not on file  Relationships  . Social connections:    Talks on phone: Not on file    Gets together: Not on file    Attends religious service: Not on file    Active member of club or organization: Not on file    Attends meetings of clubs or organizations: Not on file    Relationship status: Not on file  . Intimate partner violence:    Fear of current or ex partner: Not on file    Emotionally abused: Not on file    Physically abused: Not on file    Forced sexual activity: Not on file  Other Topics Concern  . Not on file  Social History Narrative  . Not on file    Allergies:  Allergies  Allergen Reactions  . Neosporin Wound Cleanser [Benzalkonium Chloride] Rash and Other (See Comments)    Swelling / redness    Medications:  Current Outpatient Medications on File Prior to Visit  Medication Sig Dispense Refill  . amLODipine (NORVASC) 10 MG tablet Take 10 mg by mouth daily.    Marland Kitchen aspirin 81 MG tablet Take 81 mg by mouth daily.    . hydrochlorothiazide (HYDRODIURIL) 12.5 MG tablet Take 12.5 mg by mouth daily.  1  . ibuprofen  (ADVIL,MOTRIN) 600 MG tablet Take 1 tablet (600 mg total) by mouth every 6 (six) hours as needed. 30 tablet 0  . levonorgestrel (MIRENA) 20 MCG/24HR IUD 1 each by Intrauterine route once.    Marland Kitchen losartan (COZAAR) 100 MG tablet Take 100 mg by mouth daily.  1  . Multiple Vitamin (MULTIVITAMIN) tablet Take 1 tablet by mouth daily.    . Omega-3 Fatty Acids (FISH OIL PO) Take 1 tablet by mouth daily.    . rosuvastatin (CRESTOR) 40 MG tablet Take 40 mg by mouth at bedtime.     No current facility-administered medications on file prior to visit.  Physical Exam Vitals: BP 90/60   Pulse 60   Ht 5\' 4"  (1.626 m)   Wt 156 lb (70.8 kg)   LMP  (LMP Unknown)   BMI 26.78 kg/m   General: WF in NAD HEENT: normocephalic, anicteric Neck: no thyroid enlargement, no palpable nodules, no cervical lymphadenopathy  Pulmonary: No increased work of breathing, CTAB Cardiovascular: RRR, without murmur  Breast: Breast symmetrical, no tenderness, no palpable nodules or masses, no skin or nipple retraction present, no nipple discharge. Nipples chronically turned up bilaterally. No axillary, infraclavicular or supraclavicular lymphadenopathy. Abdomen: Soft, non-tender, non-distended.  Umbilicus without lesions.  No hepatomegaly or masses palpable. No evidence of hernia. Genitourinary:  External: Normal external female genitalia.  Normal urethral meatus, normal Bartholin's and Skene's glands.    Vagina: Normal vaginal mucosa, no evidence of prolapse.    Cervix: Grossly normal in appearance, no bleeding, non-tender  Uterus: Retroflexed and levorotated, normal size, shape, and consistency, mobile, and non-tender  Adnexa: No adnexal masses, non-tender  Rectal: no masses, hemoccult negative  Lymphatic: no evidence of inguinal lymphadenopathy Extremities: no edema, erythema, or tenderness Neurologic: Grossly intact Psychiatric: mood appropriate, affect full     Assessment: 52 y.o. annual gyn exam  Plan:    1) Breast cancer screening - recommend monthly self breast exam and annual mammograms. Mammogram is UTD.   2) Colon cancer screening- Explained options for colon cancer screening and patient desires to do hemoccult testing this year. Hemoccult today was negative. Plans for Cologuard after 52  3) Cervical cancer screening - Pap was done. ASCCP guidelines and rational discussed.  Patient opts for yearly screening interval  4) Contraception - [place] now approved for 5 years use (expires 2022)  5) Routine healthcare maintenance including cholesterol and diabetes screening managed by PCP   6) RTO 1 year and prn  Dalia Heading, CNM

## 2018-08-13 LAB — CYTOLOGY - PAP: Diagnosis: NEGATIVE

## 2018-08-14 ENCOUNTER — Encounter: Payer: Self-pay | Admitting: Certified Nurse Midwife

## 2019-02-14 DIAGNOSIS — I219 Acute myocardial infarction, unspecified: Secondary | ICD-10-CM

## 2019-02-14 HISTORY — DX: Acute myocardial infarction, unspecified: I21.9

## 2019-03-16 ENCOUNTER — Encounter
Admission: RE | Disposition: A | Discharge status: Home / Self Care | Payer: Self-pay | Source: Home / Self Care | Attending: Internal Medicine

## 2019-03-16 ENCOUNTER — Observation Stay
Admission: RE | Admit: 2019-03-16 | Discharge: 2019-03-18 | Disposition: A | Discharge status: Home / Self Care | Payer: BC Managed Care – PPO | Attending: Internal Medicine | Admitting: Internal Medicine

## 2019-03-16 ENCOUNTER — Ambulatory Visit: Payer: Self-pay | Admitting: Cardiovascular Disease

## 2019-03-16 ENCOUNTER — Other Ambulatory Visit: Payer: Self-pay

## 2019-03-16 DIAGNOSIS — E785 Hyperlipidemia, unspecified: Secondary | ICD-10-CM | POA: Insufficient documentation

## 2019-03-16 DIAGNOSIS — I214 Non-ST elevation (NSTEMI) myocardial infarction: Secondary | ICD-10-CM

## 2019-03-16 DIAGNOSIS — R001 Bradycardia, unspecified: Secondary | ICD-10-CM | POA: Insufficient documentation

## 2019-03-16 DIAGNOSIS — I10 Essential (primary) hypertension: Secondary | ICD-10-CM | POA: Diagnosis not present

## 2019-03-16 DIAGNOSIS — Z79899 Other long term (current) drug therapy: Secondary | ICD-10-CM | POA: Diagnosis not present

## 2019-03-16 DIAGNOSIS — K589 Irritable bowel syndrome without diarrhea: Secondary | ICD-10-CM | POA: Insufficient documentation

## 2019-03-16 DIAGNOSIS — I200 Unstable angina: Secondary | ICD-10-CM | POA: Insufficient documentation

## 2019-03-16 DIAGNOSIS — K219 Gastro-esophageal reflux disease without esophagitis: Secondary | ICD-10-CM | POA: Insufficient documentation

## 2019-03-16 DIAGNOSIS — Z791 Long term (current) use of non-steroidal anti-inflammatories (NSAID): Secondary | ICD-10-CM | POA: Diagnosis not present

## 2019-03-16 DIAGNOSIS — E7800 Pure hypercholesterolemia, unspecified: Secondary | ICD-10-CM | POA: Diagnosis not present

## 2019-03-16 DIAGNOSIS — J45909 Unspecified asthma, uncomplicated: Secondary | ICD-10-CM | POA: Insufficient documentation

## 2019-03-16 DIAGNOSIS — I2542 Coronary artery dissection: Secondary | ICD-10-CM

## 2019-03-16 DIAGNOSIS — Z7982 Long term (current) use of aspirin: Secondary | ICD-10-CM | POA: Insufficient documentation

## 2019-03-16 DIAGNOSIS — I25110 Atherosclerotic heart disease of native coronary artery with unstable angina pectoris: Secondary | ICD-10-CM | POA: Diagnosis not present

## 2019-03-16 HISTORY — DX: Non-ST elevation (NSTEMI) myocardial infarction: I21.4

## 2019-03-16 HISTORY — DX: Coronary artery dissection: I25.42

## 2019-03-16 HISTORY — PX: LEFT HEART CATH AND CORONARY ANGIOGRAPHY: CATH118249

## 2019-03-16 LAB — TROPONIN I
TROPONIN I: 2.42 ng/mL — AB (ref <0.03)
Troponin I: 2.46 ng/mL (ref <0.03)
Troponin I: 1.98 ng/mL (ref <0.03)

## 2019-03-16 LAB — CBC
HCT: 37.6 % (ref 36.0–46.0)
Hemoglobin: 12.5 g/dL (ref 12.0–15.0)
MCH: 28.3 pg (ref 26.0–34.0)
MCHC: 33.2 g/dL (ref 30.0–36.0)
MCV: 85.1 fL (ref 80.0–100.0)
Platelets: 213 10*3/uL (ref 150–400)
RBC: 4.42 MIL/uL (ref 3.87–5.11)
RDW: 13.4 % (ref 11.5–15.5)
WBC: 11.4 10*3/uL — ABNORMAL HIGH (ref 4.0–10.5)
nRBC: 0 % (ref 0.0–0.2)

## 2019-03-16 LAB — PROTIME-INR
INR: 1.1 (ref 0.8–1.2)
Prothrombin Time: 14.3 seconds (ref 11.4–15.2)

## 2019-03-16 LAB — CREATININE, SERUM
Creatinine, Ser: 0.79 mg/dL (ref 0.44–1.00)
GFR calc Af Amer: >60 mL/min (ref >60)
GFR calc non Af Amer: >60 mL/min (ref >60)

## 2019-03-16 LAB — APTT: aPTT: 28 seconds (ref 24–36)

## 2019-03-16 SURGERY — LEFT HEART CATH AND CORONARY ANGIOGRAPHY
Anesthesia: Moderate Sedation | Laterality: Left

## 2019-03-16 MED ORDER — ONDANSETRON HCL 4 MG PO TABS: 4.0000 mg | ORAL_TABLET | Freq: Four times a day (QID) | ORAL | Status: DC | PRN

## 2019-03-16 MED ORDER — HEPARIN SODIUM (PORCINE) 1000 UNIT/ML IJ SOLN
INTRAMUSCULAR | Status: DC | PRN
  Administered 2019-03-16: 3500 [IU] via INTRAVENOUS

## 2019-03-16 MED ORDER — SODIUM CHLORIDE 0.9% FLUSH
3.0000 mL | Freq: Two times a day (BID) | INTRAVENOUS | Status: DC
  Administered 2019-03-17 – 2019-03-18 (×3): 3 mL via INTRAVENOUS

## 2019-03-16 MED ORDER — SODIUM CHLORIDE 0.9 % WEIGHT BASED INFUSION: 1.0000 mL/kg/h | INTRAVENOUS | Status: DC

## 2019-03-16 MED ORDER — SODIUM CHLORIDE 0.9 % IV SOLN: 250.0000 mL | INTRAVENOUS | Status: DC | PRN

## 2019-03-16 MED ORDER — MIDAZOLAM HCL 2 MG/2ML IJ SOLN
INTRAMUSCULAR | Status: DC | PRN
  Administered 2019-03-16: 0.5 mg via INTRAVENOUS

## 2019-03-16 MED ORDER — ASPIRIN 81 MG PO CHEW: 81.0000 mg | CHEWABLE_TABLET | ORAL | Status: DC

## 2019-03-16 MED ORDER — AMLODIPINE BESYLATE 10 MG PO TABS
10.0000 mg | ORAL_TABLET | Freq: Every day | ORAL | Status: DC
  Filled 2019-03-16: qty 1

## 2019-03-16 MED ORDER — SODIUM CHLORIDE 0.9 % WEIGHT BASED INFUSION
1.0000 mL/kg/h | INTRAVENOUS | Status: AC
  Administered 2019-03-16: 1 mL/kg/h via INTRAVENOUS

## 2019-03-16 MED ORDER — HYDROCHLOROTHIAZIDE 25 MG PO TABS: 12.5000 mg | ORAL_TABLET | Freq: Every day | ORAL | Status: DC

## 2019-03-16 MED ORDER — HEPARIN (PORCINE) IN NACL 1000-0.9 UT/500ML-% IV SOLN
INTRAVENOUS | Status: AC
  Filled 2019-03-16: qty 1000

## 2019-03-16 MED ORDER — ROSUVASTATIN CALCIUM 20 MG PO TABS
40.0000 mg | ORAL_TABLET | Freq: Every day | ORAL | Status: DC
  Administered 2019-03-17: 21:00:00 40 mg via ORAL
  Filled 2019-03-16: qty 2
  Filled 2019-03-16: qty 4
  Filled 2019-03-16: qty 2

## 2019-03-16 MED ORDER — AMLODIPINE BESYLATE 5 MG PO TABS: 10.0000 mg | ORAL_TABLET | Freq: Every day | ORAL | Status: DC

## 2019-03-16 MED ORDER — VERAPAMIL HCL 2.5 MG/ML IV SOLN
INTRAVENOUS | Status: AC
  Filled 2019-03-16: qty 2

## 2019-03-16 MED ORDER — IOPAMIDOL (ISOVUE-300) INJECTION 61%
INTRAVENOUS | Status: DC | PRN
  Administered 2019-03-16: 100 mL via INTRA_ARTERIAL

## 2019-03-16 MED ORDER — SODIUM CHLORIDE 0.9 % IV BOLUS
500.0000 mL | Freq: Once | INTRAVENOUS | Status: AC
  Administered 2019-03-16: 500 mL via INTRAVENOUS

## 2019-03-16 MED ORDER — ONDANSETRON HCL 4 MG/2ML IJ SOLN: 4.0000 mg | Freq: Four times a day (QID) | INTRAMUSCULAR | Status: DC | PRN

## 2019-03-16 MED ORDER — FENTANYL CITRATE (PF) 100 MCG/2ML IJ SOLN
INTRAMUSCULAR | Status: AC
  Filled 2019-03-16: qty 2

## 2019-03-16 MED ORDER — HEPARIN (PORCINE) 25000 UT/250ML-% IV SOLN
1150.0000 [IU]/h | INTRAVENOUS | Status: DC
  Administered 2019-03-16: 850 [IU]/h via INTRAVENOUS
  Administered 2019-03-17: 22:00:00 1150 [IU]/h via INTRAVENOUS
  Filled 2019-03-16 (×2): qty 250

## 2019-03-16 MED ORDER — ENOXAPARIN SODIUM 40 MG/0.4ML ~~LOC~~ SOLN: 40.0000 mg | SUBCUTANEOUS | Status: DC

## 2019-03-16 MED ORDER — HEPARIN SODIUM (PORCINE) 1000 UNIT/ML IJ SOLN
INTRAMUSCULAR | Status: AC
  Filled 2019-03-16: qty 1

## 2019-03-16 MED ORDER — HEPARIN (PORCINE) IN NACL 1000-0.9 UT/500ML-% IV SOLN
INTRAVENOUS | Status: DC | PRN
  Administered 2019-03-16 (×2): 500 mL

## 2019-03-16 MED ORDER — VERAPAMIL HCL 2.5 MG/ML IV SOLN
INTRAVENOUS | Status: DC | PRN
  Administered 2019-03-16: 2.5 mg via INTRAVENOUS

## 2019-03-16 MED ORDER — ACETAMINOPHEN 650 MG RE SUPP: 650.0000 mg | Freq: Four times a day (QID) | RECTAL | Status: DC | PRN

## 2019-03-16 MED ORDER — ACETAMINOPHEN 325 MG PO TABS
650.0000 mg | ORAL_TABLET | Freq: Four times a day (QID) | ORAL | Status: DC | PRN
  Administered 2019-03-16: 650 mg via ORAL
  Filled 2019-03-16: qty 2

## 2019-03-16 MED ORDER — SODIUM CHLORIDE 0.9% FLUSH: 3.0000 mL | INTRAVENOUS | Status: DC | PRN

## 2019-03-16 MED ORDER — ASPIRIN 81 MG PO CHEW
81.0000 mg | CHEWABLE_TABLET | Freq: Every day | ORAL | Status: DC
  Administered 2019-03-17 – 2019-03-18 (×2): 81 mg via ORAL
  Filled 2019-03-16 (×2): qty 1

## 2019-03-16 MED ORDER — SODIUM CHLORIDE 0.9% FLUSH: 3.0000 mL | Freq: Two times a day (BID) | INTRAVENOUS | Status: DC

## 2019-03-16 MED ORDER — ACETAMINOPHEN 325 MG PO TABS: 650.0000 mg | ORAL_TABLET | ORAL | Status: DC | PRN

## 2019-03-16 MED ORDER — IOHEXOL 300 MG/ML  SOLN
INTRAMUSCULAR | Status: DC | PRN
  Administered 2019-03-16: 20 mL via INTRA_ARTERIAL

## 2019-03-16 MED ORDER — FENTANYL CITRATE (PF) 100 MCG/2ML IJ SOLN
INTRAMUSCULAR | Status: DC | PRN
  Administered 2019-03-16: 25 ug via INTRAVENOUS

## 2019-03-16 MED ORDER — ADULT MULTIVITAMIN W/MINERALS CH
1.0000 | ORAL_TABLET | Freq: Every day | ORAL | Status: DC
  Administered 2019-03-17 – 2019-03-18 (×2): 1 via ORAL
  Filled 2019-03-16 (×2): qty 1

## 2019-03-16 MED ORDER — MORPHINE SULFATE (PF) 2 MG/ML IV SOLN
1.0000 mg | Freq: Once | INTRAVENOUS | Status: AC
  Administered 2019-03-16: 1 mg via INTRAVENOUS
  Filled 2019-03-16: qty 1

## 2019-03-16 MED ORDER — MIDAZOLAM HCL 2 MG/2ML IJ SOLN
INTRAMUSCULAR | Status: AC
  Filled 2019-03-16: qty 2

## 2019-03-16 MED ORDER — SODIUM CHLORIDE 0.9 % WEIGHT BASED INFUSION
219.0000 mL/h | INTRAVENOUS | Status: DC
  Administered 2019-03-16: 12:00:00 via INTRAVENOUS

## 2019-03-16 SURGICAL SUPPLY — 9 items
CATH INFINITI 5 FR JL3.5 (CATHETERS) ×3 IMPLANT
CATH INFINITI 5FR JL4 (CATHETERS) ×3 IMPLANT
CATH INFINITI 5FR TG (CATHETERS) ×3 IMPLANT
CATH INFINITI JR4 5F (CATHETERS) ×3 IMPLANT
DEVICE RAD TR BAND REGULAR (VASCULAR PRODUCTS) ×3 IMPLANT
GLIDESHEATH SLEND SS 6F .021 (SHEATH) ×3 IMPLANT
KIT MANI 3VAL PERCEP (MISCELLANEOUS) ×3 IMPLANT
PACK CARDIAC CATH (CUSTOM PROCEDURE TRAY) ×3 IMPLANT
WIRE ROSEN-J .035X260CM (WIRE) ×6 IMPLANT

## 2019-03-16 NOTE — Consult Note (Signed)
ANTICOAGULATION CONSULT NOTE - Initial Consult  Pharmacy Consult for heparin dosing  Indication: chest pain/ACS  Allergies  Allergen Reactions  . Neosporin Wound Cleanser [Benzalkonium Chloride] Rash and Other (See Comments)    Swelling / redness    Patient Measurements: Height: 5\' 4"  (162.6 cm) Weight: 161 lb (73 kg) IBW/kg (Calculated) : 54.7 Heparin Dosing Weight: 69.8 kg   Maintenance dosing calculation: 12 units/kg/hr  Vital Signs: Temp: 97.7 F (36.5 C) (03/31 1158) Temp Source: Oral (03/31 1158) BP: 87/41 (03/31 1515) Pulse Rate: 68 (03/31 1515)  Labs: Recent Labs    03/16/19 1519  HGB 12.5  HCT 37.6  PLT 213    CrCl cannot be calculated (Patient's most recent lab result is older than the maximum 21 days allowed.).   Medical History: Past Medical History:  Diagnosis Date  . Allergic rhinitis   . Asthma   . Coronary artery disease   . GERD (gastroesophageal reflux disease)   . History of mammogram 07/18/10; 08/25/15   neg; neg  . History of Papanicolaou smear of cervix 07/2013; 08/25/15   -/-; neg  . Hypercholesteremia   . Hypertension   . IBS (irritable bowel syndrome)      Assessment: Discussed with nurse when patient completed therapy and was informed she saw the patient ~ at 1640. Will need to begin heparin 8-hours after sheath removal. No bolus dose is needed at this time given procedure was done already.  Patient is expected to start antiplatelet therapy tomorrow.   Goal of Therapy:  Heparin level 0.3-0.7 units/ml Monitor platelets by anticoagulation protocol: Yes   Plan:  Start heparin infusion at 850 units/hr, 8 hours s/p sheath removal. Obtain a HL 6-hours after start of heparin drip.     Rowland Lathe, PharmD  03/16/2019,3:30 PM

## 2019-03-16 NOTE — Progress Notes (Signed)
Patient admitted to 2A room 257 from cath lab vis stretcher. Report given to this RN. Patient resting without complaints.  S/P R radial cath.  Dressing D/I. No hematoma noted. +CMST.  Oriented to room.

## 2019-03-16 NOTE — H&P (Signed)
[place] [facility] NAME: Zoe [name]    MR#:  [id number]  DATE OF BIRTH:  Apr 26, 1966  DATE OF ADMISSION:  03/16/2019  PRIMARY CARE PHYSICIAN: Derinda Late, MD   REQUESTING/REFERRING PHYSICIAN: Dr. Neoma Laming  CHIEF COMPLAINT:  No chief complaint on file. Chest Pain/NSTEMI  HISTORY OF PRESENT ILLNESS:  Zoe [name]  is a 53 y.o. female with a known history of hypertension, hyperlipidemia, IBS, GERD, history of coronary artery disease who presents to the hospital due to chest pain.  Patient says she has been having chest pain on and off since this past Thursday and it is more on the right side of her chest associated with some intermittent shortness of breath.  Patient says she developed more chest pain this past Saturday when she was walking and had to stop.  She went to see Dr. Yancey Flemings on Monday who did a CT coronaries which was fairly benign and her EKG showed nonspecific changes.  Patient had blood work sent by the cardiologist office and patient's troponin came back to be as high as 9.  Patient was taken to cardiac catheterization today which showed no significant CAD but small disease in the obtuse marginal.  Patient was somewhat bradycardic and hypotensive post cardiac catheterization and therefore cardiologist want to admit under observation overnight.  Presently denies any significant chest pain nausea vomiting shortness of breath cough congestion or any other associated symptoms.  PAST MEDICAL HISTORY:   Past Medical History:  Diagnosis Date  . Allergic rhinitis   . Asthma   . Coronary artery disease   . GERD (gastroesophageal reflux disease)   . History of mammogram 07/18/10; 08/25/15   neg; neg  . History of Papanicolaou smear of cervix 07/2013; 08/25/15   -/-; neg  . Hypercholesteremia   . Hypertension   . IBS (irritable bowel syndrome)     PAST SURGICAL HISTORY:   Past Surgical History:  Procedure Laterality Date   . CARDIAC CATHETERIZATION    . COLONOSCOPY  2001  . DILATATION & CURETTAGE/HYSTEROSCOPY WITH MYOSURE N/A 02/29/2016   Procedure: DILATATION & CURETTAGE/HYSTEROSCOPY WITH MYOSURE;  Surgeon: Honor Loh Ward, MD;  Location: [facility] ORS;  Service: Gynecology;  Laterality: N/A;  . EAR CYST EXCISION Left 2015   IN OFFICE  . ESOPHAGOGASTRODUODENOSCOPY    . INTRAUTERINE DEVICE (IUD) INSERTION N/A 02/29/2016   Procedure: INTRAUTERINE DEVICE (IUD) INSERTION;  Surgeon: Honor Loh Ward, MD;  Location: [facility] ORS;  Service: Gynecology;  Laterality: N/A;    SOCIAL HISTORY:   Social History   Tobacco Use  . Smoking status: Never Smoker  . Smokeless tobacco: Never Used  Substance Use Topics  . Alcohol use: Yes    Comment: occasionally    FAMILY HISTORY:   Family History  Problem Relation Age of Onset  . Hyperlipidemia Mother   . Hypertension Mother   . Heart disease Father        died from either MI or stroke  . Diabetes Brother        TYPE 2  . Heart disease Paternal Grandfather   . Diabetes Other        TYPE 2  . Cancer Neg Hx   . Breast cancer Neg Hx     DRUG ALLERGIES:   Allergies  Allergen Reactions  . Neosporin Wound Cleanser [Benzalkonium Chloride] Rash and Other (See Comments)    Swelling / redness    REVIEW OF SYSTEMS:   Review of Systems  Constitutional: Negative for fever and weight loss.  HENT: Negative for congestion, nosebleeds and tinnitus.   Eyes: Negative for blurred vision, double vision and redness.  Respiratory: Negative for cough, hemoptysis and shortness of breath.   Cardiovascular: Positive for chest pain. Negative for orthopnea, leg swelling and PND.  Gastrointestinal: Negative for abdominal pain, diarrhea, melena, nausea and vomiting.  Genitourinary: Negative for dysuria, hematuria and urgency.  Musculoskeletal: Negative for falls and joint pain.  Neurological: Negative for dizziness, tingling, sensory change, focal weakness, seizures, weakness and  headaches.  Endo/Heme/Allergies: Negative for polydipsia. Does not bruise/bleed easily.  Psychiatric/Behavioral: Negative for depression and memory loss. The patient is not nervous/anxious.     MEDICATIONS AT HOME:   Prior to Admission medications   Medication Sig Start Date End Date Taking? Authorizing Provider  aspirin 81 MG tablet Take 81 mg by mouth daily.   Yes [provider]  ibuprofen (ADVIL,MOTRIN) 600 MG tablet Take 1 tablet (600 mg total) by mouth every 6 (six) hours as needed. 02/29/16  Yes Ward, Honor Loh, MD  isosorbide mononitrate (IMDUR) 30 MG 24 hr tablet Take 30 mg by mouth daily.   Yes [provider]  losartan (COZAAR) 100 MG tablet Take 100 mg by mouth daily. 07/07/18  Yes [provider]  Metoprolol Succinate 50 MG CS24 Take by mouth.   Yes [provider]  Multiple Vitamin (MULTIVITAMIN) tablet Take 1 tablet by mouth daily.   Yes [provider]  Omega-3 Fatty Acids (FISH OIL PO) Take 1 tablet by mouth daily.   Yes [provider]  rosuvastatin (CRESTOR) 40 MG tablet Take 40 mg by mouth at bedtime.   Yes [provider]  amLODipine (NORVASC) 10 MG tablet Take 10 mg by mouth daily.    [provider]  hydrochlorothiazide (HYDRODIURIL) 12.5 MG tablet Take 12.5 mg by mouth daily. 07/07/18   [provider]  levonorgestrel (MIRENA) 20 MCG/24HR IUD 1 each by Intrauterine route once.    [provider]  metoprolol tartrate (LOPRESSOR) 25 MG tablet Take 25 mg by mouth 2 (two) times daily.    [provider]      VITAL SIGNS:  Blood pressure 90/67, pulse (!) 51, temperature 97.7 F (36.5 C), temperature source Oral, resp. rate 12, height 5\' 4"  (1.626 m), weight 73 kg, SpO2 97 %.  PHYSICAL EXAMINATION:  Physical Exam  GENERAL:  53 y.o.-year-old patient lying in the bed in no acute distress.  EYES: Pupils equal, round, reactive to light and accommodation. No scleral icterus.  Extraocular muscles intact.  HEENT: Head atraumatic, normocephalic. Oropharynx and nasopharynx clear. No oropharyngeal erythema, moist oral mucosa  NECK:  Supple, no jugular venous distention. No thyroid enlargement, no tenderness.  LUNGS: Normal breath sounds bilaterally, no wheezing, rales, rhonchi. No use of accessory muscles of respiration.  CARDIOVASCULAR: S1, S2 RRR. No murmurs, rubs, gallops, clicks.  ABDOMEN: Soft, nontender, nondistended. Bowel sounds present. No organomegaly or mass.  EXTREMITIES: No pedal edema, cyanosis, or clubbing. + 2 pedal & radial pulses b/l.   NEUROLOGIC: Cranial nerves II through XII are intact. No focal Motor or sensory deficits appreciated b/l.  PSYCHIATRIC: The patient is alert and oriented x 3. Good affect.  SKIN: No obvious rash, lesion, or ulcer.   LABORATORY PANEL:   CBC No results for input(s): WBC, HGB, HCT, PLT in the last 168 hours. ------------------------------------------------------------------------------------------------------------------  Chemistries  No results for input(s): NA, K, CL, CO2, GLUCOSE, BUN, CREATININE, CALCIUM, MG, AST, ALT, ALKPHOS,  BILITOT in the last 168 hours.  Invalid input(s): GFRCGP ------------------------------------------------------------------------------------------------------------------  Cardiac Enzymes No results for input(s): TROPONINI in the last 168 hours. ------------------------------------------------------------------------------------------------------------------  RADIOLOGY:  No results found.   IMPRESSION AND PLAN:   53 year old female w/ hx of hypertension, hyperlipidemia, IBS, GERD, history of coronary artery disease who presents to the hospital due to chest pain.   1.  Chest pain/non-ST elevation MI- patient presents to the hospital with atypical symptoms but had a troponin which was elevated as an outpatient.  Her EKG here shows nonspecific Q wave changes in the inferior leads. -  Seen by cardiology and status post cardiac catheterization today showing no significant CAD for intervention but distal OM lesion which is not amenable to intervention. - Cardiology recommended admission under observation due to some relative hypotension and bradycardia. - Continue aspirin, Norvasc, hydrochlorothiazide for now.  Continue Crestor. -Await further cardiology input.  2.  Bradycardia-this is sinus bradycardia.  Patient recently started on beta-blockers as outpatient due and will hold them for now.  - follow BP, HR  3. Hyperlipidemia - cont. Crestor  4. Essential HTN - cont. Norvasc.  Hold Toprol due to Bradycardia     All the records are reviewed and case discussed with ED provider. Management plans discussed with the patient, family and they are in agreement.  CODE STATUS: Full code  TOTAL TIME TAKING CARE OF THIS PATIENT: 45 minutes.    Henreitta Leber M.D on 03/16/2019 at 2:42 PM  Between 7am to 6pm - Pager - [phone]  After 6pm go to [email or web address] - password [email or web address] [facility]  [facility]  Office  [phone]  CC: Primary care physician; Derinda Late, MD

## 2019-03-16 NOTE — Progress Notes (Signed)
Troponins coming down, around 2 now from9 yesterday.

## 2019-03-16 NOTE — Progress Notes (Signed)
BP is still low, so will hold off HCTZ.

## 2019-03-16 NOTE — Consult Note (Signed)
Zoe Zoe [name] is Zoe [name] 53 y.o. female  [id number]  Primary Cardiologist: Neoma Laming Reason for Consultation: Chest pain/non-STEMI/spontaneous coronary dissection/Prinzmetal angina  HPI: This is Zoe [name] 53 year old white female with history of coronary artery disease 10 years ago having distal LAD disease treated medically presented to the office yesterday with chest pain which started last Thursday and continued on on Friday and Saturday described as pressure type sitting elephant on the chest.  She underwent CTA coronaries yesterday in my office which showed calcium score 0 and coronaries were normal except distal left circumflex branch obtuse marginal was not seen well.  LAD and left circumflex proximally and RCA were normal on CTA coronaries.  Because of her classic symptoms troponin was done by [facility] which showed troponin level over 9 this morning.  Thus cardiac catheterization was done which shows normal left main and normal left circumflex and LAD with first obtuse marginal having in the midportion long lesion about 80%.  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  Advise conservative treatment.   Review of Systems: Patient has dizziness but no chest pain   Past Medical History:  Diagnosis Date  . Allergic rhinitis   . Asthma   . Coronary artery disease   . GERD (gastroesophageal reflux disease)   . History of mammogram 07/18/10; 08/25/15   neg; neg  . History of Papanicolaou smear of cervix 07/2013; 08/25/15   -/-; neg  . Hypercholesteremia   . Hypertension   . IBS (irritable bowel syndrome)     Medications Prior to Admission  Medication Sig Dispense Refill  . aspirin 81 MG tablet Take 81 mg by mouth daily.    Marland Kitchen ibuprofen (ADVIL,MOTRIN) 600 MG tablet Take 1 tablet (600 mg total) by mouth every 6 (six) hours as needed. 30 tablet 0  . isosorbide mononitrate (IMDUR) 30 MG 24 hr tablet Take 30 mg by mouth daily.    Marland Kitchen losartan (COZAAR) 100 MG tablet Take 100 mg by mouth  daily.  1  . Metoprolol Succinate 50 MG CS24 Take by mouth.    . Multiple Vitamin (MULTIVITAMIN) tablet Take 1 tablet by mouth daily.    . Omega-3 Fatty Acids (FISH OIL PO) Take 1 tablet by mouth daily.    . rosuvastatin (CRESTOR) 40 MG tablet Take 40 mg by mouth at bedtime.    Marland Kitchen amLODipine (NORVASC) 10 MG tablet Take 10 mg by mouth daily.    . hydrochlorothiazide (HYDRODIURIL) 12.5 MG tablet Take 12.5 mg by mouth daily.  1  . levonorgestrel (MIRENA) 20 MCG/24HR IUD 1 each by Intrauterine route once.    . metoprolol tartrate (LOPRESSOR) 25 MG tablet Take 25 mg by mouth 2 (two) times daily.       Derrill Memo ON 03/17/2019] aspirin  81 mg Oral Pre-Cath  . sodium chloride flush  3 mL Intravenous Q12H    Infusions: . sodium chloride    . [START ON 03/17/2019] sodium chloride 500 mL/hr (03/16/19 1346)   Followed by  . [START ON 03/17/2019] sodium chloride      Allergies  Allergen Reactions  . Neosporin Wound Cleanser [Benzalkonium Chloride] Rash and Other (See Comments)    Swelling / redness    Social History   Socioeconomic History  . Marital status: Married    Spouse name: Not on file  . Number of children: 2  . Years of education: 66  . Highest education level: Not on file  Occupational History  . Occupation: Pharmacist, hospital  Comment: [employer] - [employer]  Social Needs  . Financial resource strain: Not on file  . Food insecurity:    Worry: Not on file    Inability: Not on file  . Transportation needs:    Medical: Not on file    Non-medical: Not on file  Tobacco Use  . Smoking status: Never Smoker  . Smokeless tobacco: Never Used  Substance and Sexual Activity  . Alcohol use: Yes    Comment: occasionally  . Drug use: No  . Sexual activity: Yes    Partners: Male    Birth control/protection: I.U.D.  Lifestyle  . Physical activity:    Days per week: Not on file    Minutes per session: Not on file  . Stress: Not on file  Relationships  . Social connections:    Talks on phone: Not  on file    Gets together: Not on file    Attends religious service: Not on file    Active member of club or organization: Not on file    Attends meetings of clubs or organizations: Not on file    Relationship status: Not on file  . Intimate partner violence:    Fear of current or ex partner: Not on file    Emotionally abused: Not on file    Physically abused: Not on file    Forced sexual activity: Not on file  Other Topics Concern  . Not on file  Social History Narrative  . Not on file    Family History  Problem Relation Age of Onset  . Hyperlipidemia Mother   . Hypertension Mother   . Heart disease Father        died from either MI or stroke  . Diabetes Brother        TYPE 2  . Heart disease Paternal Grandfather   . Diabetes Other        TYPE 2  . Cancer Neg Hx   . Breast cancer Neg Hx     PHYSICAL EXAM: Vitals:   03/16/19 1243 03/16/19 1415  BP:  90/67  Pulse:  (!) 51  Resp:    Temp:    SpO2: 93% 97%    No intake or output data in the 24 hours ending 03/16/19 1439  General:  Well appearing. No respiratory difficulty HEENT: normal Neck: supple. no JVD. Carotids 2+ bilat; no bruits. No lymphadenopathy or thryomegaly appreciated. Cor: PMI nondisplaced. Regular rate & rhythm. No rubs, gallops or murmurs. Lungs: clear Abdomen: soft, nontender, nondistended. No hepatosplenomegaly. No bruits or masses. Good bowel sounds. Extremities: no cyanosis, clubbing, rash, edema Neuro: alert & oriented x 3, cranial nerves grossly intact. moves all 4 extremities w/o difficulty. Affect pleasant.  ECG: EKG yesterday had sinus tachycardia with Q waves in the inferior leads and nonspecific ST-T changes in the inferior leads with lead III having half millimeter ST elevation. No results found for this or any previous visit (from the past 24 hour(s)). No results found.   ASSESSMENT AND PLAN: Spontaneous dissection of obtuse marginal #1 with the rest of the coronaries being normal  thus good possibility in Zoe [name] young woman with calcium score 0 and otherwise normal coronaries.  Treatment for spontaneous dissection is conservative and since troponin is elevated to [facility] at [facility] yesterday advise repeating troponins x3 and routine labs including CBC on Meziane.  Advise giving nitrates as well as heparin and hold off on metoprolol and start lower dose metoprolol may be 12.5 tomorrow daily since  patient is bradycardic and has labile blood pressure.  Yesterday she was tachycardic but today her heart rate in the Cath Lab dropped to 45 thus hesitate to give any further beta-blockers until she is over 70 at least.  May start nitrates at isosorbide 30 mg half tablet daily and metoprolol 12.5 mg daily tomorrow along with aspirin Plavix and statins.  Will observe the patient for Zoe [name] day or 2 to make sure the patient is doing well.  Yes  Zoe Zoe [name]

## 2019-03-17 ENCOUNTER — Encounter: Payer: Self-pay | Admitting: Cardiovascular Disease

## 2019-03-17 DIAGNOSIS — I214 Non-ST elevation (NSTEMI) myocardial infarction: Secondary | ICD-10-CM | POA: Diagnosis not present

## 2019-03-17 LAB — BASIC METABOLIC PANEL
Anion gap: 6 (ref 5–15)
BUN: 14 mg/dL (ref 6–20)
CO2: 22 mmol/L (ref 22–32)
Calcium: 7.7 mg/dL — ABNORMAL LOW (ref 8.9–10.3)
Chloride: 109 mmol/L (ref 98–111)
Creatinine, Ser: 0.59 mg/dL (ref 0.44–1.00)
GFR calc Af Amer: >60 mL/min (ref >60)
GFR calc non Af Amer: >60 mL/min (ref >60)
Glucose, Bld: 132 mg/dL — ABNORMAL HIGH (ref 70–99)
Potassium: 3.7 mmol/L (ref 3.5–5.1)
Sodium: 137 mmol/L (ref 135–145)

## 2019-03-17 LAB — CBC
HEMATOCRIT: 33.8 % — AB (ref 36.0–46.0)
Hemoglobin: 11.3 g/dL — ABNORMAL LOW (ref 12.0–15.0)
MCH: 28.3 pg (ref 26.0–34.0)
MCHC: 33.4 g/dL (ref 30.0–36.0)
MCV: 84.5 fL (ref 80.0–100.0)
PLATELETS: 189 10*3/uL (ref 150–400)
RBC: 4 MIL/uL (ref 3.87–5.11)
RDW: 13.2 % (ref 11.5–15.5)
WBC: 8.5 10*3/uL (ref 4.0–10.5)
nRBC: 0 % (ref 0.0–0.2)

## 2019-03-17 LAB — HEPARIN LEVEL (UNFRACTIONATED)
Heparin Unfractionated: 0.11 IU/mL — ABNORMAL LOW (ref 0.30–0.70)
Heparin Unfractionated: 0.31 IU/mL (ref 0.30–0.70)
Heparin Unfractionated: 0.27 IU/mL — ABNORMAL LOW (ref 0.30–0.70)

## 2019-03-17 LAB — HIV ANTIBODY (ROUTINE TESTING W REFLEX): HIV Screen 4th Generation wRfx: NONREACTIVE

## 2019-03-17 LAB — TROPONIN I
Troponin I: 1.53 ng/mL (ref <0.03)
Troponin I: 1.16 ng/mL (ref <0.03)

## 2019-03-17 MED ORDER — HEPARIN BOLUS VIA INFUSION
2000.0000 [IU] | Freq: Once | INTRAVENOUS | Status: AC
  Administered 2019-03-17: 2000 [IU] via INTRAVENOUS
  Filled 2019-03-17: qty 2000

## 2019-03-17 MED ORDER — HEPARIN BOLUS VIA INFUSION
1000.0000 [IU] | Freq: Once | INTRAVENOUS | Status: AC
  Administered 2019-03-17: 1000 [IU] via INTRAVENOUS
  Filled 2019-03-17: qty 1000

## 2019-03-17 MED ORDER — CLOPIDOGREL BISULFATE 75 MG PO TABS
75.0000 mg | ORAL_TABLET | Freq: Every day | ORAL | Status: DC
  Administered 2019-03-17 – 2019-03-18 (×2): 75 mg via ORAL
  Filled 2019-03-17 (×2): qty 1

## 2019-03-17 NOTE — Consult Note (Signed)
ANTICOAGULATION CONSULT NOTE - Initial Consult  Pharmacy Consult for heparin dosing  Indication: chest pain/ACS  Allergies  Allergen Reactions  . Ace Inhibitors Cough    Patient cannot recall the specific ACE inhibitor that caused her cough.  . Neosporin Wound Cleanser [Benzalkonium Chloride] Rash and Other (See Comments)    Swelling / redness    Patient Measurements: Height: 5\' 4"  (162.6 cm) Weight: 166 lb 3.2 oz (75.4 kg) IBW/kg (Calculated) : 54.7 Heparin Dosing Weight: 69.8 kg   Maintenance dosing calculation: 12 units/kg/hr  Vital Signs: Temp: 98.9 F (37.2 C) (04/01 0514) Temp Source: Oral (04/01 0514) BP: 90/55 (04/01 0514) Pulse Rate: 68 (04/01 0514)  Labs: Recent Labs    03/16/19 1519 03/16/19 1705 03/16/19 1938 03/16/19 2302 03/17/19 0548  HGB 12.5  --   --   --  11.3*  HCT 37.6  --   --   --  33.8*  PLT 213  --   --   --  189  APTT  --  28  --   --   --   LABPROT  --  14.3  --   --   --   INR  --  1.1  --   --   --   HEPARINUNFRC  --   --   --   --  0.11*  CREATININE 0.79  --   --   --  0.59  TROPONINI 2.46*  --  1.98* 2.42*  --     Estimated Creatinine Clearance: 83.7 mL/min (by C-G formula based on SCr of 0.59 mg/dL).   Medical History: Past Medical History:  Diagnosis Date  . Allergic rhinitis   . Asthma   . Coronary artery disease   . GERD (gastroesophageal reflux disease)   . History of mammogram 07/18/10; 08/25/15   neg; neg  . History of Papanicolaou smear of cervix 07/2013; 08/25/15   -/-; neg  . Hypercholesteremia   . Hypertension   . IBS (irritable bowel syndrome)      Assessment: Discussed with nurse when patient completed therapy and was informed she saw the patient ~ at 1640. Will need to begin heparin 8-hours after sheath removal. No bolus dose is needed at this time given procedure was done already.  Patient is expected to start antiplatelet therapy tomorrow.  3/31 Heparin infusion restarted @ 850 units/hr 8 hours s/p sheath  removal.    Goal of Therapy:  Heparin level 0.3-0.7 units/ml Monitor platelets by anticoagulation protocol: Yes   Plan:  4/1 0600 HL: 0.11. Level is subtherapeutic. Will order 2000 unit bolus and increase heparin infusion to 1050 units/hr. Recheck HL in 6 hours.  Continue to check and monitor CBC daily per protocol   Pernell Dupre, PharmD, BCPS Clinical Pharmacist 03/17/2019 6:32 AM

## 2019-03-17 NOTE — Consult Note (Signed)
ANTICOAGULATION CONSULT NOTE - Initial Consult  Pharmacy Consult for heparin dosing  Indication: chest pain/ACS  Allergies  Allergen Reactions  . Ace Inhibitors Cough    Patient cannot recall the specific ACE inhibitor that caused her cough.  . Neosporin Wound Cleanser [Benzalkonium Chloride] Rash and Other (See Comments)    Swelling / redness    Patient Measurements: Height: 5\' 4"  (162.6 cm) Weight: 166 lb 3.2 oz (75.4 kg) IBW/kg (Calculated) : 54.7 Heparin Dosing Weight: 69.8 kg   Maintenance dosing calculation: 12 units/kg/hr  Vital Signs: Temp: 98.9 F (37.2 C) (04/01 1630) Temp Source: Oral (04/01 1630) BP: 116/70 (04/01 1630) Pulse Rate: 71 (04/01 1630)  Labs: Recent Labs    03/16/19 1519 03/16/19 1705  03/16/19 2302 03/17/19 0548 03/17/19 1041 03/17/19 1258 03/17/19 1624  HGB 12.5  --   --   --  11.3*  --   --   --   HCT 37.6  --   --   --  33.8*  --   --   --   PLT 213  --   --   --  189  --   --   --   APTT  --  28  --   --   --   --   --   --   LABPROT  --  14.3  --   --   --   --   --   --   INR  --  1.1  --   --   --   --   --   --   HEPARINUNFRC  --   --   --   --  0.11*  --  0.31  --   CREATININE 0.79  --   --   --  0.59  --   --   --   TROPONINI 2.46*  --    < > 2.42*  --  1.53*  --  1.16*   < > = values in this interval not displayed.    Estimated Creatinine Clearance: 83.7 mL/min (by C-G formula based on SCr of 0.59 mg/dL).   Medical History: Past Medical History:  Diagnosis Date  . Allergic rhinitis   . Asthma   . Coronary artery disease   . GERD (gastroesophageal reflux disease)   . History of mammogram 07/18/10; 08/25/15   neg; neg  . History of Papanicolaou smear of cervix 07/2013; 08/25/15   -/-; neg  . Hypercholesteremia   . Hypertension   . IBS (irritable bowel syndrome)      Assessment: Discussed with nurse when patient completed therapy and was informed she saw the patient ~ at 1640. Will need to begin heparin 8-hours after  sheath removal. No bolus dose is needed at this time given procedure was done already.  Patient is expected to start antiplatelet therapy tomorrow.  3/31 Heparin infusion restarted @ 850 units/hr 8 hours s/p sheath removal.  4/1 0600 HL: 0.11. Level is subtherapeutic. Will order 2000 unit bolus and increase heparin infusion to 1050 units/hr.  Goal of Therapy:  Heparin level 0.3-0.7 units/ml Monitor platelets by anticoagulation protocol: Yes   Plan:  4/1 1300 HL: 0.31. Level is just therapeutic. Will continue heparin infusion at 1050 units/hr. Recheck HL in 6 hours for confirmation. Continue to check and monitor CBC daily per protocol   Paulina Fusi, PharmD, BCPS 03/17/2019 5:08 PM

## 2019-03-17 NOTE — Consult Note (Signed)
ANTICOAGULATION CONSULT NOTE - Initial Consult  Pharmacy Consult for heparin dosing  Indication: chest pain/ACS  Allergies  Allergen Reactions  . Ace Inhibitors Cough    Patient cannot recall the specific ACE inhibitor that caused her cough.  . Neosporin Wound Cleanser [Benzalkonium Chloride] Rash and Other (See Comments)    Swelling / redness    Patient Measurements: Height: 5\' 4"  (162.6 cm) Weight: 166 lb 3.2 oz (75.4 kg) IBW/kg (Calculated) : 54.7 Heparin Dosing Weight: 69.8 kg   Maintenance dosing calculation: 12 units/kg/hr  Vital Signs: Temp: 98.7 F (37.1 C) (04/01 2027) Temp Source: Oral (04/01 2027) BP: 108/67 (04/01 2027) Pulse Rate: 66 (04/01 2027)  Labs: Recent Labs    03/16/19 1519 03/16/19 1705  03/16/19 2302 03/17/19 0548 03/17/19 1041 03/17/19 1258 03/17/19 1624 03/17/19 1914  HGB 12.5  --   --   --  11.3*  --   --   --   --   HCT 37.6  --   --   --  33.8*  --   --   --   --   PLT 213  --   --   --  189  --   --   --   --   APTT  --  28  --   --   --   --   --   --   --   LABPROT  --  14.3  --   --   --   --   --   --   --   INR  --  1.1  --   --   --   --   --   --   --   HEPARINUNFRC  --   --   --   --  0.11*  --  0.31  --  0.27*  CREATININE 0.79  --   --   --  0.59  --   --   --   --   TROPONINI 2.46*  --    < > 2.42*  --  1.53*  --  1.16*  --    < > = values in this interval not displayed.    Estimated Creatinine Clearance: 83.7 mL/min (by C-G formula based on SCr of 0.59 mg/dL).   Medical History: Past Medical History:  Diagnosis Date  . Allergic rhinitis   . Asthma   . Coronary artery disease   . GERD (gastroesophageal reflux disease)   . History of mammogram 07/18/10; 08/25/15   neg; neg  . History of Papanicolaou smear of cervix 07/2013; 08/25/15   -/-; neg  . Hypercholesteremia   . Hypertension   . IBS (irritable bowel syndrome)      Assessment: Discussed with nurse when patient completed therapy and was informed she saw the  patient ~ at 1640. Will need to begin heparin 8-hours after sheath removal. No bolus dose is needed at this time given procedure was done already.  Patient is expected to start antiplatelet therapy tomorrow.  3/31 Heparin infusion restarted @ 850 units/hr 8 hours s/p sheath removal.  4/1 0600 HL: 0.11. Level is subtherapeutic. Will order 2000 unit bolus and increase heparin infusion to 1050 units/hr. 4/1 1300 HL: 0.31. Level is just therapeutic. Will continue heparin infusion at 1050 units/hr.  Goal of Therapy:  Heparin level 0.3-0.7 units/ml Monitor platelets by anticoagulation protocol: Yes   Plan:  4/1 2019 HL: 0.27. Level is slightly subtherapeutic. Will order 1000 unit bolus then  increase heparin infusion to 1150 units/hr.   Recheck HL in 6 hours for confirmation. Continue to check and monitor CBC daily per protocol   Kristeen Miss, PharmD  03/17/2019 8:31 PM

## 2019-03-17 NOTE — Progress Notes (Signed)
[place] [facility] [place] NAME: Zoe [name]    MR#:  [id number]  DATE OF BIRTH:  09-12-1966  SUBJECTIVE:  CHIEF COMPLAINT:  No chief complaint on file. Came after bradycardia and dizziness episodes after cardiac catheterization. Today she has no complaint but her troponin has been slightly up since yesterday. Her blood pressure was on the lower side yesterday but stable today.  REVIEW OF SYSTEMS:  CONSTITUTIONAL: No fever, fatigue or weakness.  EYES: No blurred or double vision.  EARS, NOSE, AND THROAT: No tinnitus or ear pain.  RESPIRATORY: No cough, shortness of breath, wheezing or hemoptysis.  CARDIOVASCULAR: No chest pain, orthopnea, edema.  GASTROINTESTINAL: No nausea, vomiting, diarrhea or abdominal pain.  GENITOURINARY: No dysuria, hematuria.  ENDOCRINE: No polyuria, nocturia,  HEMATOLOGY: No anemia, easy bruising or bleeding SKIN: No rash or lesion. MUSCULOSKELETAL: No joint pain or arthritis.   NEUROLOGIC: No tingling, numbness, weakness.  PSYCHIATRY: No anxiety or depression.   ROS  DRUG ALLERGIES:   Allergies  Allergen Reactions  . Ace Inhibitors Cough    Patient cannot recall the specific ACE inhibitor that caused her cough.  . Neosporin Wound Cleanser [Benzalkonium Chloride] Rash and Other (See Comments)    Swelling / redness    VITALS:  Blood pressure 113/80, pulse 69, temperature 98.6 F (37 C), temperature source Oral, resp. rate 20, height 5\' 4"  (1.626 m), weight 75.4 kg, SpO2 96 %.  PHYSICAL EXAMINATION:  GENERAL:  53 y.o.-year-old patient lying in the bed with no acute distress.  EYES: Pupils equal, round, reactive to light and accommodation. No scleral icterus. Extraocular muscles intact.  HEENT: Head atraumatic, normocephalic. Oropharynx and nasopharynx clear.  NECK:  Supple, no jugular venous distention. No thyroid enlargement, no tenderness.  LUNGS: Normal breath sounds bilaterally, no wheezing,  rales,rhonchi or crepitation. No use of accessory muscles of respiration.  CARDIOVASCULAR: S1, S2 normal. No murmurs, rubs, or gallops.  ABDOMEN: Soft, nontender, nondistended. Bowel sounds present. No organomegaly or mass.  EXTREMITIES: No pedal edema, cyanosis, or clubbing.  NEUROLOGIC: Cranial nerves II through XII are intact. Muscle strength 5/5 in all extremities. Sensation intact. Gait not checked.  PSYCHIATRIC: The patient is alert and oriented x 3.  SKIN: No obvious rash, lesion, or ulcer.   Physical Exam LABORATORY PANEL:   CBC Recent Labs  Lab 03/17/19 0548  WBC 8.5  HGB 11.3*  HCT 33.8*  PLT 189   ------------------------------------------------------------------------------------------------------------------  Chemistries  Recent Labs  Lab 03/17/19 0548  NA 137  K 3.7  CL 109  CO2 22  GLUCOSE 132*  BUN 14  CREATININE 0.59  CALCIUM 7.7*   ------------------------------------------------------------------------------------------------------------------  Cardiac Enzymes Recent Labs  Lab 03/16/19 2302 03/17/19 1041  TROPONINI 2.42* 1.53*   ------------------------------------------------------------------------------------------------------------------  RADIOLOGY:  No results found.  ASSESSMENT AND PLAN:   Principal Problem:   Unstable angina (HCC) Active Problems:   NSTEMI (non-ST elevated myocardial infarction) [facility])  53 year old female w/ hx of hypertension, hyperlipidemia, IBS, GERD, history of coronary artery disease who presents to the hospital due to chest pain.   1.  Chest pain/non-ST elevation MI- patient presents to the hospital with atypical symptoms but had a troponin which was elevated as an outpatient.  Her EKG here shows nonspecific Q wave changes in the inferior leads. - Seen by cardiology and status post cardiac catheterization 03/16/19 showing no significant CAD for intervention but distal OM lesion which is not amenable to  intervention. - Cardiology recommended admission under observation  due to some relative hypotension and bradycardia.  - Continue aspirin, stopped Norvasc, hydrochlorothiazide for now.  Continue Crestor. -As her troponin went slightly up from 1.9-2.4, cardiology suggested to continue monitoring in hospital until tomorrow.  2.  Bradycardia-this is sinus bradycardia.  Patient recently started on beta-blockers as outpatient due and will hold them for now.  - follow BP, HR  3. Hyperlipidemia - cont. Crestor  4. Essential HTN -hold Norvasc as blood pressure was running on the lower side.  Hold Toprol due to Bradycardia    All the records are reviewed and case discussed with Care Management/Social Workerr. Management plans discussed with the patient, family and they are in agreement.  CODE STATUS: Full code.  TOTAL TIME TAKING CARE OF THIS PATIENT: 35* minutes.     POSSIBLE D/C IN 1-2 DAYS, DEPENDING ON CLINICAL CONDITION.   Vaughan Basta M.D on 03/17/2019   Between 7am to 6pm - Pager - [phone]  After 6pm go to [email or web address] - password [email or web address] [facility]  Office  [phone]  CC: Primary care physician; Derinda Late, MD  Note: This dictation was prepared with Dragon dictation along with smaller phrase technology. Any transcriptional errors that result from this process are unintentional.

## 2019-03-17 NOTE — Progress Notes (Signed)
SUBJECTIVE: Pt is feeling well, no pain in right radial access point. No chest pain or shortness of breath.    Vitals:   03/17/19 0805 03/17/19 0814 03/17/19 0816 03/17/19 0818  BP: 103/65 100/68 116/74 113/80  Pulse: 66 68 69 69  Resp:      Temp: 98.6 F (37 C)     TempSrc: Oral     SpO2: 97% 95% 95% 96%  Weight:      Height:        Intake/Output Summary (Last 24 hours) at 03/17/2019 0903 Last data filed at 03/17/2019 0520 Gross per 24 hour  Intake 1219.24 ml  Output 300 ml  Net 919.24 ml    LABS: Basic Metabolic Panel: Recent Labs    03/16/19 1519 03/17/19 0548  NA  --  137  K  --  3.7  CL  --  109  CO2  --  22  GLUCOSE  --  132*  BUN  --  14  CREATININE 0.79 0.59  CALCIUM  --  7.7*   Liver Function Tests: No results for input(s): AST, ALT, ALKPHOS, BILITOT, PROT, ALBUMIN in the last 72 hours. No results for input(s): LIPASE, AMYLASE in the last 72 hours. CBC: Recent Labs    03/16/19 1519 03/17/19 0548  WBC 11.4* 8.5  HGB 12.5 11.3*  HCT 37.6 33.8*  MCV 85.1 84.5  PLT 213 189   Cardiac Enzymes: Recent Labs    03/16/19 1519 03/16/19 1938 03/16/19 2302  TROPONINI 2.46* 1.98* 2.42*   BNP: Invalid input(s): POCBNP D-Dimer: No results for input(s): DDIMER in the last 72 hours. Hemoglobin A1C: No results for input(s): HGBA1C in the last 72 hours. Fasting Lipid Panel: No results for input(s): CHOL, HDL, LDLCALC, TRIG, CHOLHDL, LDLDIRECT in the last 72 hours. Thyroid Function Tests: No results for input(s): TSH, T4TOTAL, T3FREE, THYROIDAB in the last 72 hours.  Invalid input(s): FREET3 Anemia Panel: No results for input(s): VITAMINB12, FOLATE, FERRITIN, TIBC, IRON, RETICCTPCT in the last 72 hours.   PHYSICAL EXAM General: Well developed, well nourished, in no acute distress HEENT:  Normocephalic and atramatic Neck:  No JVD.  Lungs: Clear bilaterally to auscultation and percussion. Heart: HRRR . Normal S1 and S2 without gallops or murmurs.   Abdomen: Bowel sounds are positive, abdomen soft and non-tender  Msk:  Back normal, normal gait. Normal strength and tone for age. Extremities: No clubbing, cyanosis or edema.   Neuro: Alert and oriented X 3. Psych:  Good affect, responds appropriately  TELEMETRY: NSR 70bp  ASSESSMENT AND PLAN: NSTEMI: Cath showed spontaneous dissection of OM1. Treating medically. Troponin was 1.9 yesterday at [place] but increased back to 2.4 at 2300 last night. Advise trending 2 more troponins and will keep admitted for observation for one more day. Will start plavix today, and low dose isosorbide metoprolol prior to discharge tomorrow.    Principal Problem:   Unstable angina [facility]) Active Problems:   NSTEMI (non-ST elevated myocardial infarction) ([place])    Zoe Bathe, NP-C 03/17/2019 9:03 AM Cell: [phone]

## 2019-03-17 NOTE — Progress Notes (Signed)
CRITICAL VALUE ALERT  Critical Value: troponin 1.53 Date & Time Notied: 03/17/19 1120 Provider Notified: Drs. Humphrey Rolls and Naschitti  Orders Received/Actions taken: none. Value is lower. S/p cardiac cath and on heparin drip.

## 2019-03-18 DIAGNOSIS — I214 Non-ST elevation (NSTEMI) myocardial infarction: Secondary | ICD-10-CM | POA: Diagnosis not present

## 2019-03-18 LAB — HEPARIN LEVEL (UNFRACTIONATED)
Heparin Unfractionated: 0.51 IU/mL (ref 0.30–0.70)
Heparin Unfractionated: 0.32 IU/mL (ref 0.30–0.70)

## 2019-03-18 LAB — CBC
HCT: 36.6 % (ref 36.0–46.0)
Hemoglobin: 12.3 g/dL (ref 12.0–15.0)
MCH: 28.1 pg (ref 26.0–34.0)
MCHC: 33.6 g/dL (ref 30.0–36.0)
MCV: 83.8 fL (ref 80.0–100.0)
Platelets: 202 10*3/uL (ref 150–400)
RBC: 4.37 MIL/uL (ref 3.87–5.11)
RDW: 13.2 % (ref 11.5–15.5)
WBC: 6.7 10*3/uL (ref 4.0–10.5)
nRBC: 0 % (ref 0.0–0.2)

## 2019-03-18 MED ORDER — ISOSORBIDE MONONITRATE ER 30 MG PO TB24: 15.0000 mg | ORAL_TABLET | Freq: Every day | ORAL | 0 refills | Status: DC

## 2019-03-18 MED ORDER — METOPROLOL SUCCINATE ER 25 MG PO TB24: 25.0000 mg | ORAL_TABLET | Freq: Every day | ORAL | 0 refills | Status: DC

## 2019-03-18 MED ORDER — CLOPIDOGREL BISULFATE 75 MG PO TABS: 75.0000 mg | ORAL_TABLET | Freq: Every day | ORAL | 0 refills | Status: DC

## 2019-03-18 MED ORDER — ISOSORBIDE MONONITRATE ER 30 MG PO TB24
15.0000 mg | ORAL_TABLET | Freq: Every day | ORAL | Status: DC
  Administered 2019-03-18: 15 mg via ORAL
  Filled 2019-03-18: qty 1

## 2019-03-18 MED ORDER — METOPROLOL SUCCINATE ER 25 MG PO TB24
25.0000 mg | ORAL_TABLET | Freq: Every day | ORAL | Status: DC
  Administered 2019-03-18: 10:00:00 25 mg via ORAL
  Filled 2019-03-18: qty 1

## 2019-03-18 NOTE — Progress Notes (Signed)
SUBJECTIVE: Pt is feeling well, no chest pain or shortness of breath, eager to return home.    Vitals:   03/17/19 1630 03/17/19 2027 03/18/19 0408 03/18/19 0737  BP: 116/70 108/67 106/77 126/85  Pulse: 71 66 68 72  Resp:  18 16 19   Temp: 98.9 F (37.2 C) 98.7 F (37.1 C) 99 F (37.2 C) 98.6 F (37 C)  TempSrc: Oral Oral Oral Oral  SpO2: 96% 94% 94% 96%  Weight:   75.9 kg   Height:        Intake/Output Summary (Last 24 hours) at 03/18/2019 0920 Last data filed at 03/18/2019 0916 Gross per 24 hour  Intake 1183 ml  Output 600 ml  Net 583 ml    LABS: Basic Metabolic Panel: Recent Labs    03/16/19 1519 03/17/19 0548  NA  --  137  K  --  3.7  CL  --  109  CO2  --  22  GLUCOSE  --  132*  BUN  --  14  CREATININE 0.79 0.59  CALCIUM  --  7.7*   Liver Function Tests: No results for input(s): AST, ALT, ALKPHOS, BILITOT, PROT, ALBUMIN in the last 72 hours. No results for input(s): LIPASE, AMYLASE in the last 72 hours. CBC: Recent Labs    03/17/19 0548 03/18/19 0253  WBC 8.5 6.7  HGB 11.3* 12.3  HCT 33.8* 36.6  MCV 84.5 83.8  PLT 189 202   Cardiac Enzymes: Recent Labs    03/16/19 2302 03/17/19 1041 03/17/19 1624  TROPONINI 2.42* 1.53* 1.16*   BNP: Invalid input(s): POCBNP D-Dimer: No results for input(s): DDIMER in the last 72 hours. Hemoglobin A1C: No results for input(s): HGBA1C in the last 72 hours. Fasting Lipid Panel: No results for input(s): CHOL, HDL, LDLCALC, TRIG, CHOLHDL, LDLDIRECT in the last 72 hours. Thyroid Function Tests: No results for input(s): TSH, T4TOTAL, T3FREE, THYROIDAB in the last 72 hours.  Invalid input(s): FREET3 Anemia Panel: No results for input(s): VITAMINB12, FOLATE, FERRITIN, TIBC, IRON, RETICCTPCT in the last 72 hours.   PHYSICAL EXAM General: Well developed, well nourished, in no acute distress HEENT:  Normocephalic and atramatic Neck:  No JVD.  Lungs: Clear bilaterally to auscultation and percussion. Heart: HRRR .  Normal S1 and S2 without gallops or murmurs.  Abdomen: Bowel sounds are positive, abdomen soft and non-tender  Msk:  Back normal, normal gait. Normal strength and tone for age. Extremities: No clubbing, cyanosis or edema.   Neuro: Alert and oriented X 3. Psych:  Good affect, responds appropriately  TELEMETRY: NSR 80bpm  ASSESSMENT AND PLAN: NSTEMI: Cath showed spontaneous dissection of OM1. Treating medically. Troponin is now trending down, 2.42, 1.53, 1.16. May discharge home today with outpatient follow up with Dr. Humphrey Rolls Monday 10am. Advise discharging on palvix, aspirin, isosorbide 15mg , metoprolol succ 25mg .   Principal Problem:   Unstable angina [facility]) Active Problems:   NSTEMI (non-ST elevated myocardial infarction) ([place])    Jake Bathe, NP-C 03/18/2019 9:20 AM Cell: [phone]

## 2019-03-18 NOTE — Consult Note (Signed)
ANTICOAGULATION CONSULT NOTE - Initial Consult  Pharmacy Consult for heparin dosing  Indication: chest pain/ACS  Allergies  Allergen Reactions  . Ace Inhibitors Cough    Patient cannot recall the specific ACE inhibitor that caused her cough.  . Neosporin Wound Cleanser [Benzalkonium Chloride] Rash and Other (See Comments)    Swelling / redness    Patient Measurements: Height: 5\' 4"  (162.6 cm) Weight: 167 lb 6.4 oz (75.9 kg) IBW/kg (Calculated) : 54.7 Heparin Dosing Weight: 69.8 kg   Maintenance dosing calculation: 12 units/kg/hr  Vital Signs: Temp: 98.6 F (37 C) (04/02 0737) Temp Source: Oral (04/02 0737) BP: 126/77 (04/02 0932) Pulse Rate: 73 (04/02 0932)  Labs: Recent Labs    03/16/19 1519 03/16/19 1705  03/16/19 2302 03/17/19 0548 03/17/19 1041  03/17/19 1624 03/17/19 1914 03/18/19 0253 03/18/19 0858  HGB 12.5  --   --   --  11.3*  --   --   --   --  12.3  --   HCT 37.6  --   --   --  33.8*  --   --   --   --  36.6  --   PLT 213  --   --   --  189  --   --   --   --  202  --   APTT  --  28  --   --   --   --   --   --   --   --   --   LABPROT  --  14.3  --   --   --   --   --   --   --   --   --   INR  --  1.1  --   --   --   --   --   --   --   --   --   HEPARINUNFRC  --   --   --   --  0.11*  --    < >  --  0.27* 0.51 0.32  CREATININE 0.79  --   --   --  0.59  --   --   --   --   --   --   TROPONINI 2.46*  --    < > 2.42*  --  1.53*  --  1.16*  --   --   --    < > = values in this interval not displayed.    Estimated Creatinine Clearance: 83.9 mL/min (by C-G formula based on SCr of 0.59 mg/dL).   Medical History: Past Medical History:  Diagnosis Date  . Allergic rhinitis   . Asthma   . Coronary artery disease   . GERD (gastroesophageal reflux disease)   . History of mammogram 07/18/10; 08/25/15   neg; neg  . History of Papanicolaou smear of cervix 07/2013; 08/25/15   -/-; neg  . Hypercholesteremia   . Hypertension   . IBS (irritable bowel syndrome)       Assessment: Discussed with nurse when patient completed therapy and was informed she saw the patient ~ at 1640. Will need to begin heparin 8-hours after sheath removal. No bolus dose is needed at this time given procedure was done already.  Patient is expected to start antiplatelet therapy tomorrow.  3/31 Heparin infusion restarted @ 850 units/hr 8 hours s/p sheath removal.  4/1 0600 HL: 0.11. Level is subtherapeutic. Will order 2000 unit bolus and increase heparin infusion to  1050 units/hr. 4/1 1300 HL: 0.31. Level is just therapeutic. Will continue heparin infusion at 1050 units/hr. 4/1 2019 HL: 0.27. Level subtherapeutic Infusion increased to 1150 units/hr.  4/2 0253 HL: 0.51. Level is therapeutic. Will continue current heparin infusion rate of 1150 units/hr  Goal of Therapy:  Heparin level 0.3-0.7 units/ml Monitor platelets by anticoagulation protocol: Yes   Plan:  4/2 08:58 HL: 0.32. Level is therapeutic. Will continue current heparin infusion rate of 1150 units/hr.   Recheck HL with AM labs. Continue to check and monitor CBC daily per protocol   Paulina Fusi, PharmD, BCPS 03/18/2019 9:48 AM

## 2019-03-18 NOTE — Discharge Summary (Signed)
[facility] [place] NAME: Zoe [name]    MR#:  [id number]  DATE OF BIRTH:  1966/01/05  DATE OF ADMISSION:  03/16/2019 ADMITTING PHYSICIAN: Henreitta Leber, MD  DATE OF DISCHARGE: 03/18/19  PRIMARY CARE PHYSICIAN: Derinda Late, MD    ADMISSION DIAGNOSIS:  NSTEMI (non-ST elevated myocardial infarction) ([place]) [I21.4]  DISCHARGE DIAGNOSIS:  Principal Problem:   Unstable angina ([place]) Active Problems:   NSTEMI (non-ST elevated myocardial infarction) ([place])   SECONDARY DIAGNOSIS:   Past Medical History:  Diagnosis Date  . Allergic rhinitis   . Asthma   . Coronary artery disease   . GERD (gastroesophageal reflux disease)   . History of mammogram 07/18/10; 08/25/15   neg; neg  . History of Papanicolaou smear of cervix 07/2013; 08/25/15   -/-; neg  . Hypercholesteremia   . Hypertension   . IBS (irritable bowel syndrome)     HOSPITAL COURSE:   53 year old femalew/ hx ofhypertension, hyperlipidemia, IBS, GERD, history of coronary artery disease who presents to the hospital due to chest pain.   1.Chest pain/non-ST elevation MI- patient presents to the hospital with atypical symptoms but had a troponin which was elevated as an outpatient. Her EKG here shows nonspecific Q wave changes in the inferior leads. - Seen by cardiology and status post cardiac catheterization 03/16/19 showing no significant CAD for intervention but distal OM lesion which is not amenable to intervention. - Cardiology recommended admission under observation due to some relative hypotension and bradycardia.  - Continue aspirin, stopped Norvasc, hydrochlorothiazide for now. Continue Crestor. -As her troponin went slightly up from 1.9-2.4, cardiology suggested to continue monitoring in hospital until tomorrow. - follow up troponin dropped and cardiology suggested to discharge.  2. Bradycardia-this is sinus bradycardia. Patient recently started on  beta-blockers as outpatient due and will hold them for now.  - follow BP, HR  3. Hyperlipidemia - cont. Crestor  4. Essential HTN -hold Norvasc as blood pressure was running on the lower side. Hold Toprol due to Bradycardia  DISCHARGE CONDITIONS:   Stable.  CONSULTS OBTAINED:  Treatment Team:  Dionisio David, MD  DRUG ALLERGIES:   Allergies  Allergen Reactions  . Ace Inhibitors Cough    Patient cannot recall the specific ACE inhibitor that caused her cough.  . Neosporin Wound Cleanser [Benzalkonium Chloride] Rash and Other (See Comments)    Swelling / redness    DISCHARGE MEDICATIONS:   Allergies as of 03/18/2019      Reactions   Ace Inhibitors Cough   Patient cannot recall the specific ACE inhibitor that caused her cough.   Neosporin Wound Cleanser [benzalkonium Chloride] Rash, Other (See Comments)   Swelling / redness      Medication List    STOP taking these medications   losartan 100 MG tablet Commonly known as:  COZAAR     TAKE these medications   aspirin 81 MG tablet Take 81 mg by mouth daily.   clopidogrel 75 MG tablet Commonly known as:  PLAVIX Take 1 tablet (75 mg total) by mouth daily. Start taking on:  March 19, 2019   Fish Oil 1000 MG Caps Take 1 tablet by mouth daily.   ibuprofen 600 MG tablet Commonly known as:  ADVIL,MOTRIN Take 1 tablet (600 mg total) by mouth every 6 (six) hours as needed.   isosorbide mononitrate 30 MG 24 hr tablet Commonly known as:  IMDUR Take 0.5 tablets (15 mg total) by mouth daily. Start taking  on:  March 19, 2019 What changed:  how much to take   metoprolol succinate 25 MG 24 hr tablet Commonly known as:  TOPROL-XL Take 1 tablet (25 mg total) by mouth daily. Start taking on:  March 19, 2019 What changed:    medication strength  how much to take   multivitamin tablet Take 1 tablet by mouth daily.   rosuvastatin 40 MG tablet Commonly known as:  CRESTOR Take 40 mg by mouth daily.        DISCHARGE  INSTRUCTIONS:    Follow with PMD in 1-2 weeks.  If you experience worsening of your admission symptoms, develop shortness of breath, life threatening emergency, suicidal or homicidal thoughts you must seek medical attention immediately by calling 911 or calling your MD immediately  if symptoms less severe.  You Must read complete instructions/literature along with all the possible adverse reactions/side effects for all the Medicines you take and that have been prescribed to you. Take any new Medicines after you have completely understood and accept all the possible adverse reactions/side effects.   Please note  You were cared for by a hospitalist during your hospital stay. If you have any questions about your discharge medications or the care you received while you were in the hospital after you are discharged, you can call the unit and asked to speak with the hospitalist on call if the hospitalist that took care of you is not available. Once you are discharged, your primary care physician will handle any further medical issues. Please note that NO REFILLS for any discharge medications will be authorized once you are discharged, as it is imperative that you return to your primary care physician (or establish a relationship with a primary care physician if you do not have one) for your aftercare needs so that they can reassess your need for medications and monitor your lab values.    Today   CHIEF COMPLAINT:  No chief complaint on file.   HISTORY OF PRESENT ILLNESS:  Zoe [name]  is a 53 y.o. female with a known history of hypertension, hyperlipidemia, IBS, GERD, history of coronary artery disease who presents to the hospital due to chest pain.  Patient says she has been having chest pain on and off since this past Thursday and it is more on the right side of her chest associated with some intermittent shortness of breath.  Patient says she developed more chest pain this past Saturday when  she was walking and had to stop.  She went to see Dr. Yancey Flemings on Monday who did a CT coronaries which was fairly benign and her EKG showed nonspecific changes.  Patient had blood work sent by the cardiologist office and patient's troponin came back to be as high as 9.  Patient was taken to cardiac catheterization today which showed no significant CAD but small disease in the obtuse marginal.  Patient was somewhat bradycardic and hypotensive post cardiac catheterization and therefore cardiologist want to admit under observation overnight.  Presently denies any significant chest pain nausea vomiting shortness of breath cough congestion or any other associated symptoms.   VITAL SIGNS:  Blood pressure 126/77, pulse 73, temperature 98.6 F (37 C), temperature source Oral, resp. rate 19, height 5\' 4"  (1.626 m), weight 75.9 kg, SpO2 96 %.  I/O:    Intake/Output Summary (Last 24 hours) at 03/18/2019 0946 Last data filed at 03/18/2019 0919 Gross per 24 hour  Intake 1157.92 ml  Output 600 ml  Net 557.92 ml  PHYSICAL EXAMINATION:  GENERAL:  53 y.o.-year-old patient lying in the bed with no acute distress.  EYES: Pupils equal, round, reactive to light and accommodation. No scleral icterus. Extraocular muscles intact.  HEENT: Head atraumatic, normocephalic. Oropharynx and nasopharynx clear.  NECK:  Supple, no jugular venous distention. No thyroid enlargement, no tenderness.  LUNGS: Normal breath sounds bilaterally, no wheezing, rales,rhonchi or crepitation. No use of accessory muscles of respiration.  CARDIOVASCULAR: S1, S2 normal. No murmurs, rubs, or gallops.  ABDOMEN: Soft, non-tender, non-distended. Bowel sounds present. No organomegaly or mass.  EXTREMITIES: No pedal edema, cyanosis, or clubbing.  NEUROLOGIC: Cranial nerves II through XII are intact. Muscle strength 5/5 in all extremities. Sensation intact. Gait not checked.  PSYCHIATRIC: The patient is alert and oriented x 3.  SKIN: No obvious rash,  lesion, or ulcer.   DATA REVIEW:   CBC Recent Labs  Lab 03/18/19 0253  WBC 6.7  HGB 12.3  HCT 36.6  PLT 202    Chemistries  Recent Labs  Lab 03/17/19 0548  NA 137  K 3.7  CL 109  CO2 22  GLUCOSE 132*  BUN 14  CREATININE 0.59  CALCIUM 7.7*    Cardiac Enzymes Recent Labs  Lab 03/17/19 1624  TROPONINI 1.16*    Microbiology Results  No results found for this or any previous visit.  RADIOLOGY:  No results found.  EKG:  No orders found for this or any previous visit.    Management plans discussed with the patient, family and they are in agreement.  CODE STATUS:     Code Status Orders  (From admission, onward)         Start     Ordered   03/16/19 1513  Full code  Continuous     03/16/19 1512        Code Status History    Date Active Date Inactive Code Status Order ID Comments User Context   03/16/2019 1512 03/16/2019 1512 Full Code [id number]  Henreitta Leber, MD Inpatient      TOTAL TIME TAKING CARE OF THIS PATIENT: 35 minutes.    Vaughan Basta M.D on 03/18/2019 at 9:46 AM  Between 7am to 6pm - Pager - [phone]  After 6pm go to [email or web address] - password [email or web address] [facility]  Office  [phone]  CC: Primary care physician; Derinda Late, MD   Note: This dictation was prepared with Dragon dictation along with smaller phrase technology. Any transcriptional errors that result from this process are unintentional.

## 2019-03-18 NOTE — Progress Notes (Signed)
Patient given discharge instructions. IV's taken out and tele monitor off. Patient verbalized understanding with no questions or concerns.Patient going home in stable condition.

## 2019-03-18 NOTE — Consult Note (Signed)
ANTICOAGULATION CONSULT NOTE - Initial Consult  Pharmacy Consult for heparin dosing  Indication: chest pain/ACS  Allergies  Allergen Reactions  . Ace Inhibitors Cough    Patient cannot recall the specific ACE inhibitor that caused her cough.  . Neosporin Wound Cleanser [Benzalkonium Chloride] Rash and Other (See Comments)    Swelling / redness    Patient Measurements: Height: 5\' 4"  (162.6 cm) Weight: 166 lb 3.2 oz (75.4 kg) IBW/kg (Calculated) : 54.7 Heparin Dosing Weight: 69.8 kg   Maintenance dosing calculation: 12 units/kg/hr  Vital Signs: Temp: 98.7 F (37.1 C) (04/01 2027) Temp Source: Oral (04/01 2027) BP: 108/67 (04/01 2027) Pulse Rate: 66 (04/01 2027)  Labs: Recent Labs    03/16/19 1519 03/16/19 1705  03/16/19 2302 03/17/19 0548 03/17/19 1041 03/17/19 1258 03/17/19 1624 03/17/19 1914 03/18/19 0253  HGB 12.5  --   --   --  11.3*  --   --   --   --  12.3  HCT 37.6  --   --   --  33.8*  --   --   --   --  36.6  PLT 213  --   --   --  189  --   --   --   --  202  APTT  --  28  --   --   --   --   --   --   --   --   LABPROT  --  14.3  --   --   --   --   --   --   --   --   INR  --  1.1  --   --   --   --   --   --   --   --   HEPARINUNFRC  --   --   --   --  0.11*  --  0.31  --  0.27*  --   CREATININE 0.79  --   --   --  0.59  --   --   --   --   --   TROPONINI 2.46*  --    < > 2.42*  --  1.53*  --  1.16*  --   --    < > = values in this interval not displayed.    Estimated Creatinine Clearance: 83.7 mL/min (by C-G formula based on SCr of 0.59 mg/dL).   Medical History: Past Medical History:  Diagnosis Date  . Allergic rhinitis   . Asthma   . Coronary artery disease   . GERD (gastroesophageal reflux disease)   . History of mammogram 07/18/10; 08/25/15   neg; neg  . History of Papanicolaou smear of cervix 07/2013; 08/25/15   -/-; neg  . Hypercholesteremia   . Hypertension   . IBS (irritable bowel syndrome)      Assessment: Discussed with nurse  when patient completed therapy and was informed she saw the patient ~ at 1640. Will need to begin heparin 8-hours after sheath removal. No bolus dose is needed at this time given procedure was done already.  Patient is expected to start antiplatelet therapy tomorrow.  3/31 Heparin infusion restarted @ 850 units/hr 8 hours s/p sheath removal.  4/1 0600 HL: 0.11. Level is subtherapeutic. Will order 2000 unit bolus and increase heparin infusion to 1050 units/hr. 4/1 1300 HL: 0.31. Level is just therapeutic. Will continue heparin infusion at 1050 units/hr. 4/1 2019 HL: 0.27. Level subtherapeutic Infusion increased to 1150  units/hr.   Goal of Therapy:  Heparin level 0.3-0.7 units/ml Monitor platelets by anticoagulation protocol: Yes   Plan:  4/2 0253 HL: 0.51. Level is therapeutic. Will continue current heparin infusion rate of 1150 units/hr.   Recheck HL in 6 hours for confirmation. Continue to check and monitor CBC daily per protocol   Pernell Dupre, PharmD, BCPS Clinical Pharmacist 03/18/2019 3:25 AM

## 2019-03-18 NOTE — Plan of Care (Signed)

## 2019-04-08 ENCOUNTER — Encounter: Payer: Self-pay | Admitting: *Deleted

## 2019-09-21 ENCOUNTER — Other Ambulatory Visit: Payer: Self-pay | Admitting: Certified Nurse Midwife

## 2019-09-21 DIAGNOSIS — Z1231 Encounter for screening mammogram for malignant neoplasm of breast: Secondary | ICD-10-CM

## 2019-09-28 ENCOUNTER — Other Ambulatory Visit: Payer: Self-pay

## 2019-09-28 ENCOUNTER — Ambulatory Visit
Admission: RE | Admit: 2019-09-28 | Discharge: 2019-09-28 | Disposition: A | Discharge status: Home / Self Care | Payer: BC Managed Care – PPO | Source: Ambulatory Visit | Attending: Certified Nurse Midwife | Admitting: Certified Nurse Midwife

## 2019-09-28 DIAGNOSIS — Z1231 Encounter for screening mammogram for malignant neoplasm of breast: Secondary | ICD-10-CM | POA: Insufficient documentation

## 2019-10-05 ENCOUNTER — Other Ambulatory Visit: Payer: Self-pay

## 2019-10-05 ENCOUNTER — Encounter: Payer: Self-pay | Admitting: Obstetrics and Gynecology

## 2019-10-05 ENCOUNTER — Ambulatory Visit (INDEPENDENT_AMBULATORY_CARE_PROVIDER_SITE_OTHER): Payer: BC Managed Care – PPO | Admitting: Obstetrics and Gynecology

## 2019-10-05 VITALS — BP 102/70 | Ht 64.5 in | Wt 177.0 lb

## 2019-10-05 DIAGNOSIS — Z0000 Encounter for general adult medical examination without abnormal findings: Secondary | ICD-10-CM

## 2019-10-05 DIAGNOSIS — Z1231 Encounter for screening mammogram for malignant neoplasm of breast: Secondary | ICD-10-CM | POA: Diagnosis not present

## 2019-10-05 DIAGNOSIS — Z1211 Encounter for screening for malignant neoplasm of colon: Secondary | ICD-10-CM | POA: Diagnosis not present

## 2019-10-05 NOTE — Patient Instructions (Signed)
Institute of Medicine Recommended Dietary Allowances for Calcium and Vitamin D  Age (yr) Calcium Recommended Dietary Allowance (mg/day) Vitamin D Recommended Dietary Allowance (international units/day)  9-18 1,300 600  19-50 1,000 600  51-70 1,200 600  71 and older 1,200 800  Data from Institute of Medicine. Dietary reference intakes: calcium, vitamin D. [place], [place]: National Academies Press; 2011.    Exercising to Stay Healthy To become healthy and stay healthy, it is recommended that you do moderate-intensity and vigorous-intensity exercise. You can tell that you are exercising at a moderate intensity if your heart starts beating faster and you start breathing faster but can still hold a conversation. You can tell that you are exercising at a vigorous intensity if you are breathing much harder and faster and cannot hold a conversation while exercising. Exercising regularly is important. It has many health benefits, such as:  Improving overall fitness, flexibility, and endurance.  Increasing bone density.  Helping with weight control.  Decreasing body fat.  Increasing muscle strength.  Reducing stress and tension.  Improving overall health. How often should I exercise? Choose an activity that you enjoy, and set realistic goals. Your health care provider can help you make an activity plan that works for you. Exercise regularly as told by your health care provider. This may include:  Doing strength training two times a week, such as: ? Lifting weights. ? Using resistance bands. ? Push-ups. ? Sit-ups. ? Yoga.  Doing a certain intensity of exercise for a given amount of time. Choose from these options: ? A total of 150 minutes of moderate-intensity exercise every week. ? A total of 75 minutes of vigorous-intensity exercise every week. ? A mix of moderate-intensity and vigorous-intensity exercise every week. Children, pregnant women, people who have not exercised  regularly, people who are overweight, and older adults may need to talk with a health care provider about what activities are safe to do. If you have a medical condition, be sure to talk with your health care provider before you start a new exercise program. What are some exercise ideas? Moderate-intensity exercise ideas include:  Walking 1 mile (1.6 km) in about 15 minutes.  Biking.  Hiking.  Golfing.  Dancing.  Water aerobics. Vigorous-intensity exercise ideas include:  Walking 4.5 miles (7.2 km) or more in about 1 hour.  Jogging or running 5 miles (8 km) in about 1 hour.  Biking 10 miles (16.1 km) or more in about 1 hour.  Lap swimming.  Roller-skating or in-line skating.  Cross-country skiing.  Vigorous competitive sports, such as football, basketball, and soccer.  Jumping rope.  Aerobic dancing. What are some everyday activities that can help me to get exercise?  Yard work, such as: ? Pushing a lawn mower. ? Raking and bagging leaves.  Washing your car.  Pushing a stroller.  Shoveling snow.  Gardening.  Washing windows or floors. How can I be more active in my day-to-day activities?  Use stairs instead of an elevator.  Take a walk during your lunch break.  If you drive, park your car farther away from your work or school.  If you take public transportation, get off one stop early and walk the rest of the way.  Stand up or walk around during all of your indoor phone calls.  Get up, stretch, and walk around every 30 minutes throughout the day.  Enjoy exercise with a friend. Support to continue exercising will help you keep a regular routine of activity. What guidelines can I   follow while exercising?  Before you start a new exercise program, talk with your health care provider.  Do not exercise so much that you hurt yourself, feel dizzy, or get very short of breath.  Wear comfortable clothes and wear shoes with good support.  Drink plenty of  water while you exercise to prevent dehydration or heat stroke.  Work out until your breathing and your heartbeat get faster. Where to find more information  U.S. Department of Health and Human Services: [employer]at  Centers for Disease Control and Prevention (CDC): http://www.wolf.info/ Summary  Exercising regularly is important. It will improve your overall fitness, flexibility, and endurance.  Regular exercise also will improve your overall health. It can help you control your weight, reduce stress, and improve your bone density.  Do not exercise so much that you hurt yourself, feel dizzy, or get very short of breath.  Before you start a new exercise program, talk with your health care provider. This information is not intended to replace advice given to you by your health care provider. Make sure you discuss any questions you have with your health care provider. Document Released: 01/04/2011 Document Revised: 11/14/2017 Document Reviewed: 10/23/2017 Elsevier Patient Education  2020 [employer].  Budget-Friendly Healthy Eating There are many ways to save money at the grocery store and continue to eat healthy. You can be successful if you:  Plan meals according to your budget.  Make a grocery list and only purchase food according to your grocery list.  Prepare food yourself. What are tips for following this plan?  Reading food labels  Compare food labels between brand name foods and the store brand. Often the nutritional value is the same, but the store brand is lower cost.  Look for products that do not have added sugar, fat, or salt (sodium). These often cost the same but are healthier for you. Products may be labeled as: ? Sugar-free. ? Nonfat. ? Low-fat. ? Sodium-free. ? Low-sodium.  Look for lean ground beef labeled as at least 92% lean and 8% fat. Shopping  Buy only the items on your grocery list and go only to the areas of the store that have the items on your list.   Use coupons only for foods and brands you normally buy. Avoid buying items you wouldn't normally buy simply because they are on sale.  Check online and in newspapers for weekly deals.  Buy healthy items from the bulk bins when available, such as herbs, spices, flour, pasta, nuts, and dried fruit.  Buy fruits and vegetables that are in season. Prices are usually lower on in-season produce.  Look at the unit price on the price tag. Use it to compare different brands and sizes to find out which item is the best deal.  Choose healthy items that are often low-cost, such as carrots, potatoes, apples, bananas, and oranges. Dried or canned beans are a low-cost protein source.  Buy in bulk and freeze extra food. Items you can buy in bulk include meats, fish, poultry, frozen fruits, and frozen vegetables.  Avoid buying "ready-to-eat" foods, such as pre-cut fruits and vegetables and pre-made salads.  If possible, shop around to discover where you can find the best prices. Consider other retailers such as dollar stores, larger [employer], local fruit and vegetable stands, and farmers markets.  Do not shop when you are hungry. If you shop while hungry, it may be hard to stick to your list and budget.  Resist impulse buying. Use your grocery list  as your official plan for the week.  Buy a variety of vegetables and fruits by purchasing fresh, frozen, and canned items.  Look at the top and bottom shelves for deals. Foods at eye level (eye level of an adult or child) are usually more expensive.  Be efficient with your time when shopping. The more time you spend at the store, the more money you are likely to spend.  To save money when choosing more expensive foods like meats and dairy: ? Choose cheaper cuts of meat, such as bone-in chicken thighs and drumsticks instead of skinless and boneless chicken. When you are ready to prepare the chicken, you can remove the skin yourself to make it healthier.  ? Choose lean meats like chicken or [place] instead of beef. ? Choose canned seafood, such as tuna, salmon, or sardines. ? Buy eggs as a low-cost source of protein. ? Buy dried beans and peas, such as lentils, split peas, or kidney beans instead of meats. Dried beans and peas are a good alternative source of protein. ? Buy the larger tubs of yogurt instead of individual-sized containers.  Choose water instead of sodas and other sweetened beverages.  Avoid buying chips, cookies, and other "junk food." These items are usually expensive and not healthy. Cooking  Make extra food and freeze the extras in meal-sized containers or in individual portions for fast meals and snacks.  Pre-cook on days when you have extra time to prepare meals in advance. You can keep these meals in the fridge or freezer and reheat for a quick meal.  When you come home from the grocery store, wash, peel, and cut fruits and vegetables so they are ready to use and eat. This will help reduce food waste. Meal planning  Do not eat out or get fast food. Prepare food at home.  Make a grocery list and make sure to bring it with you to the store. If you have a smart phone, you could use your phone to create your shopping list.  Plan meals and snacks according to a grocery list and budget you create.  Use leftovers in your meal plan for the week.  Look for recipes where you can cook once and make enough food for two meals.  Include budget-friendly meals like stews, casseroles, and stir-fry dishes.  Try some meatless meals or try "no cook" meals like salads.  Make sure that half your plate is filled with fruits or vegetables. Choose from fresh, frozen, or canned fruits and vegetables. If eating canned, remember to rinse them before eating. This will remove any excess salt added for packaging. Summary  Eating healthy on a budget is possible if you plan your meals according to your budget, purchase according to your budget  and grocery list, and prepare food yourself.  Tips for buying more food on a limited budget include buying generic brands, using coupons only for foods you normally buy, and buying healthy items from the bulk bins when available.  Tips for buying cheaper food to replace expensive food include choosing cheaper, lean cuts of meat, and buying dried beans and peas. This information is not intended to replace advice given to you by your health care provider. Make sure you discuss any questions you have with your health care provider. Document Released: 08/05/2014 Document Revised: 12/03/2017 Document Reviewed: 12/03/2017 Elsevier Patient Education  2020 [employer].

## 2019-10-05 NOTE — Progress Notes (Signed)
Gynecology Annual Exam  PCP: Derinda Late, MD  Chief Complaint:  Chief Complaint  Patient presents with  . Gynecologic Exam    History of Present Illness:Patient is a 53 y.o. [id number] presents for annual exam. The patient has no complaints today.   LMP: No LMP recorded. (Menstrual status: IUD). Review of Systems: ROS  Past Medical History:  Past Medical History:  Diagnosis Date  . Allergic rhinitis   . Asthma   . Coronary artery disease   . GERD (gastroesophageal reflux disease)   . History of mammogram 07/18/10; 08/25/15   neg; neg  . History of Papanicolaou smear of cervix 07/2013; 08/25/15   -/-; neg  . Hypercholesteremia   . Hypertension   . IBS (irritable bowel syndrome)     Past Surgical History:  Past Surgical History:  Procedure Laterality Date  . CARDIAC CATHETERIZATION    . COLONOSCOPY  2001  . DILATATION & CURETTAGE/HYSTEROSCOPY WITH MYOSURE N/A 02/29/2016   Procedure: DILATATION & CURETTAGE/HYSTEROSCOPY WITH MYOSURE;  Surgeon: Honor Loh Ward, MD;  Location: [facility];  Service: Gynecology;  Laterality: N/A;  . EAR CYST EXCISION Left 2015   IN OFFICE  . ESOPHAGOGASTRODUODENOSCOPY    . INTRAUTERINE DEVICE (IUD) INSERTION N/A 02/29/2016   Procedure: INTRAUTERINE DEVICE (IUD) INSERTION;  Surgeon: Honor Loh Ward, MD;  Location: [facility];  Service: Gynecology;  Laterality: N/A;  . LEFT HEART CATH AND CORONARY ANGIOGRAPHY Left 03/16/2019   Procedure: LEFT HEART CATH AND CORONARY ANGIOGRAPHY;  Surgeon: Dionisio David, MD;  Location: [facility];  Service: Cardiovascular;  Laterality: Left;    Gynecologic History:  No LMP recorded. (Menstrual status: IUD). Obstetric History: [id number]  Family History:  Family History  Problem Relation Age of Onset  . Hyperlipidemia Mother   . Hypertension Mother   . Heart disease Father        died from either MI or stroke  . Diabetes Brother        TYPE 2  . Heart disease Paternal Grandfather   . Diabetes Other         TYPE 2  . Cancer Neg Hx   . Breast cancer Neg Hx     Social History:  Social History   Socioeconomic History  . Marital status: Married    Spouse name: Not on file  . Number of children: 2  . Years of education: 79  . Highest education level: Not on file  Occupational History  . Occupation: Teacher    Comment: [employer] - 2[employer]  Social Needs  . Financial resource strain: Not on file  . Food insecurity    Worry: Not on file    Inability: Not on file  . Transportation needs    Medical: Not on file    Non-medical: Not on file  Tobacco Use  . Smoking status: Never Smoker  . Smokeless tobacco: Never Used  Substance and Sexual Activity  . Alcohol use: Yes    Comment: occasionally  . Drug use: No  . Sexual activity: Yes    Partners: Male    Birth control/protection: I.U.D.  Lifestyle  . Physical activity    Days per week: Not on file    Minutes per session: Not on file  . Stress: Not on file  Relationships  . Social Herbalist on phone: Not on file    Gets together: Not on file    Attends religious service: Not on file  Active member of club or organization: Not on file    Attends meetings of clubs or organizations: Not on file    Relationship status: Not on file  . Intimate partner violence    Fear of current or ex partner: Not on file    Emotionally abused: Not on file    Physically abused: Not on file    Forced sexual activity: Not on file  Other Topics Concern  . Not on file  Social History Narrative  . Not on file    Allergies:  Allergies  Allergen Reactions  . Ace Inhibitors Cough    Patient cannot recall the specific ACE inhibitor that caused her cough.  . Neosporin Wound Cleanser [Benzalkonium Chloride] Rash and Other (See Comments)    Swelling / redness    Medications: Prior to Admission medications   Medication Sig Start Date End Date Taking? Authorizing Provider  aspirin 81 MG tablet Take 81 mg by mouth daily.   Yes [provider]  clopidogrel (PLAVIX) 75 MG tablet Take 1 tablet (75 mg total) by mouth daily. 03/19/19  Yes Vaughan Basta, MD  ibuprofen (ADVIL,MOTRIN) 600 MG tablet Take 1 tablet (600 mg total) by mouth every 6 (six) hours as needed. 02/29/16  Yes Ward, Honor Loh, MD  isosorbide mononitrate (IMDUR) 30 MG 24 hr tablet Take 0.5 tablets (15 mg total) by mouth daily. 03/19/19  Yes Vaughan Basta, MD  metoprolol succinate (TOPROL-XL) 25 MG 24 hr tablet Take 1 tablet (25 mg total) by mouth daily. 03/19/19  Yes Vaughan Basta, MD  Multiple Vitamin (MULTIVITAMIN) tablet Take 1 tablet by mouth daily.   Yes [provider]  Omega-3 Fatty Acids (FISH OIL) 1000 MG CAPS Take 1 tablet by mouth daily.    Yes [provider]  nitroGLYCERIN (NITROSTAT) 0.3 MG SL tablet USE AS DIRECTED AS NEEDED FOR CHEST PAIN 04/19/19   [provider]  rosuvastatin (CRESTOR) 40 MG tablet Take 40 mg by mouth daily.     [provider]    Physical Exam Vitals: Blood pressure 102/70, height 5' 4.5" (1.638 m), weight 177 lb (80.3 kg).  General: NAD HEENT: normocephalic, anicteric Thyroid: no enlargement, no palpable nodules Pulmonary: No increased work of breathing, CTAB Cardiovascular: RRR, distal pulses 2+ Breast: Breast symmetrical, no tenderness, no palpable nodules or masses, no skin or nipple retraction present, no nipple discharge.  No axillary or supraclavicular lymphadenopathy. Abdomen: NABS, soft, non-tender, non-distended.  Umbilicus without lesions.  No hepatomegaly, splenomegaly or masses palpable. No evidence of hernia  Genitourinary:  External: Normal external female genitalia.  Normal urethral meatus, normal Bartholin's and Skene's glands.    Vagina: Normal vaginal mucosa, no evidence of prolapse.    Cervix: Grossly normal in appearance, no bleeding  Uterus: Non-enlarged, mobile, normal contour.  No CMT  Adnexa: ovaries non-enlarged, no adnexal  masses  Rectal: deferred  Lymphatic: no evidence of inguinal lymphadenopathy Extremities: no edema, erythema, or tenderness Neurologic: Grossly intact Psychiatric: mood appropriate, affect full  Female chaperone present for pelvic and breast  portions of the physical exam     Assessment: 53 y.o. [id number] routine annual exam  Plan: Problem List Items Addressed This Visit    None    Visit Diagnoses    Breast cancer screening by mammogram    -  Primary   Relevant Orders   MM DIGITAL SCREENING BILATERAL   Colon cancer screening       Relevant Orders   Ambulatory referral to Gastroenterology  1) Mammogram - recommend yearly screening mammogram.  Mammogram Was ordered today  2) STI screening was offered and declined  3) ASCCP guidelines and rational discussed.  Patient opts for every 5 years screening interval  4) Routine healthcare maintenance including cholesterol, diabetes screening discussed managed by PCP  5) Colonoscopy referral made  6) Dicussed stress incontinence breifly  7) Follow up in 1 year for annual  Adrian Prows MD [place] 10/05/2019 3:14 PM

## 2019-10-06 ENCOUNTER — Other Ambulatory Visit: Payer: Self-pay

## 2019-10-06 ENCOUNTER — Telehealth: Payer: Self-pay

## 2019-10-06 DIAGNOSIS — Z1211 Encounter for screening for malignant neoplasm of colon: Secondary | ICD-10-CM

## 2019-10-06 NOTE — Telephone Encounter (Signed)
Gastroenterology Pre-Procedure Review  Request Date: Friday 11/05/19 Requesting Physician: Dr. Bonna Gains  PATIENT REVIEW QUESTIONS: The patient responded to the following health history questions as indicated:    1. Are you having any GI issues? no 2. Do you have a personal history of Polyps? no 3. Do you have a family history of Colon Cancer or Polyps? no 4. Diabetes Mellitus? no 5. Joint replacements in the past 12 months?no 6. Major health problems in the past 3 months?Cardiac Cath March 2020 Cardiac Clearance sent to Dr. Neoma Laming 7. Any artificial heart valves, MVP, or defibrillator?no    MEDICATIONS & ALLERGIES:    Patient reports the following regarding taking any anticoagulation/antiplatelet therapy:   Plavix, Coumadin, Eliquis, Xarelto, Lovenox, Pradaxa, Brilinta, or Effient? yes (Plavix Blood Thinner Sent to Dr. Neoma Laming) Aspirin? yes (81 mg daily)  Patient confirms/reports the following medications:  Current Outpatient Medications  Medication Sig Dispense Refill  . aspirin 81 MG tablet Take 81 mg by mouth daily.    . clopidogrel (PLAVIX) 75 MG tablet Take 1 tablet (75 mg total) by mouth daily. 30 tablet 0  . ibuprofen (ADVIL,MOTRIN) 600 MG tablet Take 1 tablet (600 mg total) by mouth every 6 (six) hours as needed. 30 tablet 0  . isosorbide mononitrate (IMDUR) 30 MG 24 hr tablet Take 0.5 tablets (15 mg total) by mouth daily. 30 tablet 0  . metoprolol succinate (TOPROL-XL) 25 MG 24 hr tablet Take 1 tablet (25 mg total) by mouth daily. 30 tablet 0  . Multiple Vitamin (MULTIVITAMIN) tablet Take 1 tablet by mouth daily.    . nitroGLYCERIN (NITROSTAT) 0.3 MG SL tablet USE AS DIRECTED AS NEEDED FOR CHEST PAIN    . Omega-3 Fatty Acids (FISH OIL) 1000 MG CAPS Take 1 tablet by mouth daily.     . rosuvastatin (CRESTOR) 40 MG tablet Take 40 mg by mouth daily.      No current facility-administered medications for this visit.     Patient confirms/reports the following  allergies:  Allergies  Allergen Reactions  . Ace Inhibitors Cough    Patient cannot recall the specific ACE inhibitor that caused her cough.  . Neosporin Wound Cleanser [Benzalkonium Chloride] Rash and Other (See Comments)    Swelling / redness    No orders of the defined types were placed in this encounter.   AUTHORIZATION INFORMATION Primary Insurance: 1D#: Group #:  Secondary Insurance: 1D#: Group #:  SCHEDULE INFORMATION: Date: 11/05/19 Time: Location:[facility]

## 2019-10-11 ENCOUNTER — Telehealth: Payer: Self-pay

## 2019-10-11 NOTE — Telephone Encounter (Signed)
LVM to advise patient per Dr. Humphrey Rolls to stop Plavix/Clopidogrel 5 days prior to her scheduled colonoscopy restart 1 day after, but to continue with her 81 mg aspirin.  Thanks [employer]

## 2019-11-02 ENCOUNTER — Other Ambulatory Visit
Admission: RE | Admit: 2019-11-02 | Discharge: 2019-11-02 | Disposition: A | Discharge status: Home / Self Care | Payer: BC Managed Care – PPO | Source: Ambulatory Visit | Attending: Gastroenterology | Admitting: Gastroenterology

## 2019-11-02 ENCOUNTER — Other Ambulatory Visit: Payer: Self-pay

## 2019-11-02 DIAGNOSIS — Z20828 Contact with and (suspected) exposure to other viral communicable diseases: Secondary | ICD-10-CM | POA: Diagnosis not present

## 2019-11-02 DIAGNOSIS — Z01812 Encounter for preprocedural laboratory examination: Secondary | ICD-10-CM | POA: Insufficient documentation

## 2019-11-03 LAB — SARS CORONAVIRUS 2 (TAT 6-24 HRS): SARS Coronavirus 2: NEGATIVE

## 2019-11-04 ENCOUNTER — Encounter: Payer: Self-pay | Admitting: *Deleted

## 2019-11-05 ENCOUNTER — Ambulatory Visit
Admission: RE | Admit: 2019-11-05 | Discharge: 2019-11-05 | Disposition: A | Discharge status: Home / Self Care | Payer: BC Managed Care – PPO | Attending: Gastroenterology | Admitting: Gastroenterology

## 2019-11-05 ENCOUNTER — Encounter
Admission: RE | Disposition: A | Discharge status: Home / Self Care | Payer: Self-pay | Source: Home / Self Care | Attending: Gastroenterology

## 2019-11-05 ENCOUNTER — Ambulatory Visit: Payer: BC Managed Care – PPO | Admitting: Anesthesiology

## 2019-11-05 ENCOUNTER — Other Ambulatory Visit: Payer: Self-pay

## 2019-11-05 DIAGNOSIS — Z7902 Long term (current) use of antithrombotics/antiplatelets: Secondary | ICD-10-CM | POA: Diagnosis not present

## 2019-11-05 DIAGNOSIS — I2510 Atherosclerotic heart disease of native coronary artery without angina pectoris: Secondary | ICD-10-CM | POA: Insufficient documentation

## 2019-11-05 DIAGNOSIS — I10 Essential (primary) hypertension: Secondary | ICD-10-CM | POA: Insufficient documentation

## 2019-11-05 DIAGNOSIS — K5730 Diverticulosis of large intestine without perforation or abscess without bleeding: Secondary | ICD-10-CM | POA: Insufficient documentation

## 2019-11-05 DIAGNOSIS — E7800 Pure hypercholesterolemia, unspecified: Secondary | ICD-10-CM | POA: Insufficient documentation

## 2019-11-05 DIAGNOSIS — Z8249 Family history of ischemic heart disease and other diseases of the circulatory system: Secondary | ICD-10-CM | POA: Insufficient documentation

## 2019-11-05 DIAGNOSIS — I252 Old myocardial infarction: Secondary | ICD-10-CM | POA: Insufficient documentation

## 2019-11-05 DIAGNOSIS — Z975 Presence of (intrauterine) contraceptive device: Secondary | ICD-10-CM | POA: Diagnosis not present

## 2019-11-05 DIAGNOSIS — Z79899 Other long term (current) drug therapy: Secondary | ICD-10-CM | POA: Diagnosis not present

## 2019-11-05 DIAGNOSIS — Z7982 Long term (current) use of aspirin: Secondary | ICD-10-CM | POA: Insufficient documentation

## 2019-11-05 DIAGNOSIS — Z1211 Encounter for screening for malignant neoplasm of colon: Secondary | ICD-10-CM

## 2019-11-05 DIAGNOSIS — J45909 Unspecified asthma, uncomplicated: Secondary | ICD-10-CM | POA: Insufficient documentation

## 2019-11-05 DIAGNOSIS — K589 Irritable bowel syndrome without diarrhea: Secondary | ICD-10-CM | POA: Insufficient documentation

## 2019-11-05 DIAGNOSIS — K219 Gastro-esophageal reflux disease without esophagitis: Secondary | ICD-10-CM | POA: Insufficient documentation

## 2019-11-05 HISTORY — PX: COLONOSCOPY WITH PROPOFOL: SHX5780

## 2019-11-05 LAB — POCT PREGNANCY, URINE: Preg Test, Ur: NEGATIVE

## 2019-11-05 SURGERY — COLONOSCOPY WITH PROPOFOL
Anesthesia: General

## 2019-11-05 MED ORDER — SODIUM CHLORIDE 0.9 % IV SOLN
INTRAVENOUS | Status: DC
  Administered 2019-11-05 (×2): via INTRAVENOUS

## 2019-11-05 MED ORDER — MIDAZOLAM HCL 2 MG/2ML IJ SOLN
INTRAMUSCULAR | Status: DC | PRN
  Administered 2019-11-05: 1 mg via INTRAVENOUS

## 2019-11-05 MED ORDER — FENTANYL CITRATE (PF) 100 MCG/2ML IJ SOLN
INTRAMUSCULAR | Status: DC | PRN
  Administered 2019-11-05: 50 ug via INTRAVENOUS

## 2019-11-05 MED ORDER — MIDAZOLAM HCL 2 MG/2ML IJ SOLN
INTRAMUSCULAR | Status: AC
  Filled 2019-11-05: qty 2

## 2019-11-05 MED ORDER — PROPOFOL 500 MG/50ML IV EMUL
INTRAVENOUS | Status: AC
  Filled 2019-11-05: qty 50

## 2019-11-05 MED ORDER — PROPOFOL 500 MG/50ML IV EMUL
INTRAVENOUS | Status: DC | PRN
  Administered 2019-11-05: 180 ug/kg/min via INTRAVENOUS

## 2019-11-05 MED ORDER — FENTANYL CITRATE (PF) 100 MCG/2ML IJ SOLN
INTRAMUSCULAR | Status: AC
  Filled 2019-11-05: qty 2

## 2019-11-05 MED ORDER — PROPOFOL 10 MG/ML IV BOLUS
INTRAVENOUS | Status: DC | PRN
  Administered 2019-11-05: 40 mg via INTRAVENOUS
  Administered 2019-11-05: 50 mg via INTRAVENOUS

## 2019-11-05 NOTE — Anesthesia Preprocedure Evaluation (Signed)
Anesthesia Evaluation  Patient identified by MRN, date of birth, ID band Patient awake    Reviewed: Allergy & Precautions, H&P , NPO status , Patient's Chart, lab work & pertinent test results, reviewed documented beta blocker date and time   Airway Mallampati: II   Neck ROM: full    Dental  (+) Poor Dentition   Pulmonary neg shortness of breath, asthma ,    Pulmonary exam normal        Cardiovascular Exercise Tolerance: Good hypertension, On Medications (-) angina+ CAD and + Past MI  Normal cardiovascular exam Rhythm:regular Rate:Normal  Followed by Chancy Milroy and doing well sp mi in March.     Neuro/Psych negative neurological ROS  negative psych ROS   GI/Hepatic Neg liver ROS, GERD  Medicated,  Endo/Other  negative endocrine ROS  Renal/GU negative Renal ROS  negative genitourinary   Musculoskeletal   Abdominal   Peds  Hematology negative hematology ROS (+)   Anesthesia Other Findings Past Medical History: No date: Allergic rhinitis No date: Asthma No date: Coronary artery disease No date: GERD (gastroesophageal reflux disease) 07/18/10; 08/25/15: History of mammogram     Comment:  neg; neg 07/2013; 08/25/15: History of Papanicolaou smear of cervix     Comment:  -/-; neg No date: Hypercholesteremia No date: Hypertension No date: IBS (irritable bowel syndrome) 02/2019: Myocardial infarction [facility]) Past Surgical History: No date: CARDIAC CATHETERIZATION 2001: COLONOSCOPY No date: CORONARY ANGIOPLASTY 02/29/2016: [place]; N/A     Comment:  Procedure: [place];  Surgeon: Honor Loh Ward, MD;  Location: [facility]               ORS;  Service: Gynecology;  Laterality: N/A; No date: DILATION AND CURETTAGE OF UTERUS 2015: EAR CYST EXCISION; Left     Comment:  IN OFFICE No date: ESOPHAGOGASTRODUODENOSCOPY 02/29/2016: INTRAUTERINE  DEVICE (IUD) INSERTION; N/A     Comment:  Procedure: INTRAUTERINE DEVICE (IUD) INSERTION;                Surgeon: Honor Loh Ward, MD;  Location: [facility] ORS;                Service: Gynecology;  Laterality: N/A; 03/16/2019: LEFT HEART CATH AND CORONARY ANGIOGRAPHY; Left     Comment:  Procedure: LEFT HEART CATH AND CORONARY ANGIOGRAPHY;                Surgeon: Dionisio David, MD;  Location: [facility];  Service: Cardiovascular;  Laterality: Left; BMI    Body Mass Index: 30.38 kg/m     Reproductive/Obstetrics negative OB ROS                             Anesthesia Physical Anesthesia Plan  ASA: III  Anesthesia Plan: General   Post-op Pain Management:    Induction:   PONV Risk Score and Plan:   Airway Management Planned:   Additional Equipment:   Intra-op Plan:   Post-operative Plan:   Informed Consent: I have reviewed the patients History and Physical, chart, labs and discussed the procedure including the risks, benefits and alternatives for the proposed anesthesia with the patient or authorized representative who has indicated his/her understanding and acceptance.     Dental Advisory Given  Plan Discussed  with: CRNA  Anesthesia Plan Comments:         Anesthesia Quick Evaluation

## 2019-11-05 NOTE — Anesthesia Post-op Follow-up Note (Signed)
Anesthesia QCDR form completed.        

## 2019-11-05 NOTE — Anesthesia Postprocedure Evaluation (Signed)
Anesthesia Post Note  Patient: Zoe [name]  Procedure(s) Performed: COLONOSCOPY WITH PROPOFOL (N/A )  Patient location during evaluation: PACU Anesthesia Type: General Level of consciousness: awake and alert Pain management: pain level controlled Vital Signs Assessment: post-procedure vital signs reviewed and stable Respiratory status: spontaneous breathing, nonlabored ventilation, respiratory function stable and patient connected to nasal cannula oxygen Cardiovascular status: blood pressure returned to baseline and stable Postop Assessment: no apparent nausea or vomiting Anesthetic complications: no     Last Vitals:  Vitals:   11/05/19 1001 11/05/19 1011  BP: 138/70 137/82  Pulse: 68 64  Resp: 19 16  Temp:    SpO2: 95% 97%    Last Pain:  Vitals:   11/05/19 1011  TempSrc:   PainSc: 0-No pain                 Molli Barrows

## 2019-11-05 NOTE — Op Note (Signed)
[facility] Patient Name: Zoe [name] Procedure Date: 11/05/2019 9:01 AM MRN: [id number] Account #: [id number] Date of Birth: 25-Aug-1966 Admit Type: Outpatient Age: 53 Room: [facility] ENDO ROOM 2 Gender: Female Note Status: Finalized Procedure:             Colonoscopy Indications:           Screening for colorectal malignant neoplasm Providers:             Ibeth Fahmy B. Bonna Gains MD, MD Referring MD:          Caprice Renshaw MD (Referring MD) Medicines:             Monitored Anesthesia Care Complications:         No immediate complications. Procedure:             Pre-Anesthesia Assessment:                        - Prior to the procedure, a History and Physical was                         performed, and patient medications, allergies and                         sensitivities were reviewed. The patient's tolerance                         of previous anesthesia was reviewed.                        - The risks and benefits of the procedure and the                         sedation options and risks were discussed with the                         patient. All questions were answered and informed                         consent was obtained.                        - Patient identification and proposed procedure were                         verified prior to the procedure by the physician, the                         nurse, the anesthetist and the technician. The                         procedure was verified in the pre-procedure area in                         the procedure room in the endoscopy suite.                        - ASA Grade Assessment: II - A patient with mild  systemic disease.                        - After reviewing the risks and benefits, the patient                         was deemed in satisfactory condition to undergo the                         procedure.                        After obtaining informed  consent, the colonoscope was                         passed under direct vision. Throughout the procedure,                         the patient's blood pressure, pulse, and oxygen                         saturations were monitored continuously. The                         Colonoscope was introduced through the anus and                         advanced to the the cecum, identified by appendiceal                         orifice and ileocecal valve. The colonoscopy was                         performed with ease. The patient tolerated the                         procedure well. The quality of the bowel preparation                         was good. Findings:      The perianal and digital rectal examinations were normal.      A few diverticula were found in the sigmoid colon.      The exam was otherwise without abnormality.      The rectum, sigmoid colon, descending colon, transverse colon, ascending       colon and cecum appeared normal.      The retroflexed view of the distal rectum and anal verge was normal and       showed no anal or rectal abnormalities. Impression:            - Diverticulosis in the sigmoid colon.                        - The examination was otherwise normal.                        - The rectum, sigmoid colon, descending colon,                         transverse colon, ascending colon and cecum are normal.                        -  The distal rectum and anal verge are normal on                         retroflexion view.                        - No specimens collected. Recommendation:        - Discharge patient to home.                        - Resume previous diet.                        - Continue present medications.                        - Repeat colonoscopy in 10 years for screening                         purposes.                        - Return to primary care physician as previously                         scheduled.                        - The findings and  recommendations were discussed with                         the patient.                        - The findings and recommendations were discussed with                         the patient's family.                        - High fiber diet.                        - In the future, if patient develops new symptoms such                         as blood per rectum, abdominal pain, weight loss,                         altered bowel habits or any other reason for concern,                         patient should discuss this with thier PCP as they may                         need a GI referral at that time or evaluation for need                         for colonoscopy earlier than the recommended screening  colonoscopy.                        In addition, if patient's family history of colon                         cancer changes (no family history at this time) in the                         future, earlier screening may be indicated and patient                         should discuss this with PCP as well. Procedure Code(s):     --- Professional ---                        [phone], Colonoscopy, flexible; diagnostic, including                         collection of specimen(s) by brushing or washing, when                         performed (separate procedure) Diagnosis Code(s):     --- Professional ---                        Z12.11, Encounter for screening for malignant neoplasm                         of colon CPT copyright 2019 American Medical Association. All rights reserved. The codes documented in this report are preliminary and upon coder review may  be revised to meet current compliance requirements.  [name], MD Margretta Sidle B. Bonna Gains MD, MD 11/05/2019 9:36:13 AM This report has been signed electronically. Number of Addenda: 0 Note Initiated On: 11/05/2019 9:01 AM Scope Withdrawal Time: 0 hours 10 minutes 21 seconds  Total Procedure Duration: 0 hours 14  minutes 38 seconds       [facility]

## 2019-11-05 NOTE — Transfer of Care (Signed)
Immediate Anesthesia Transfer of Care Note  Patient: Waunda Mohan  Procedure(s) Performed: COLONOSCOPY WITH PROPOFOL (N/A )  Patient Location: PACU  Anesthesia Type:General  Level of Consciousness: awake and alert   Airway & Oxygen Therapy: Patient Spontanous Breathing and Patient connected to nasal cannula oxygen  Post-op Assessment: Report given to RN and Post -op Vital signs reviewed and stable  Post vital signs: Reviewed and stable  Last Vitals:  Vitals Value Taken Time  BP 100/51 11/05/19 0941  Temp 36.4 C 11/05/19 0941  Pulse 68 11/05/19 0944  Resp 17 11/05/19 0944  SpO2 99 % 11/05/19 0944  Vitals shown include unvalidated device data.  Last Pain:  Vitals:   11/05/19 0941  TempSrc: Temporal  PainSc: 0-No pain         Complications: No apparent anesthesia complications

## 2019-11-05 NOTE — H&P (Signed)
Zoe Antigua, MD [address] [place], [place], [place], [address] [address], [place], [place], [place], [address] Phone: [phone]  Fax: [phone]  Primary Care Physician:  Derinda Late, MD   Pre-Procedure History & Physical: HPI:  Zoe [name] is a 53 y.o. female is here for a colonoscopy.   Past Medical History:  Diagnosis Date  . Allergic rhinitis   . Asthma   . Coronary artery disease   . GERD (gastroesophageal reflux disease)   . History of mammogram 07/18/10; 08/25/15   neg; neg  . History of Papanicolaou smear of cervix 07/2013; 08/25/15   -/-; neg  . Hypercholesteremia   . Hypertension   . IBS (irritable bowel syndrome)   . Myocardial infarction [facility]) 02/2019    Past Surgical History:  Procedure Laterality Date  . CARDIAC CATHETERIZATION    . COLONOSCOPY  2001  . CORONARY ANGIOPLASTY    . DILATATION & CURETTAGE/HYSTEROSCOPY WITH MYOSURE N/A 02/29/2016   Procedure: DILATATION & CURETTAGE/HYSTEROSCOPY WITH MYOSURE;  Surgeon: Honor Loh Ward, MD;  Location: [facility] ORS;  Service: Gynecology;  Laterality: N/A;  . DILATION AND CURETTAGE OF UTERUS    . EAR CYST EXCISION Left 2015   IN OFFICE  . ESOPHAGOGASTRODUODENOSCOPY    . INTRAUTERINE DEVICE (IUD) INSERTION N/A 02/29/2016   Procedure: INTRAUTERINE DEVICE (IUD) INSERTION;  Surgeon: Honor Loh Ward, MD;  Location: [facility] ORS;  Service: Gynecology;  Laterality: N/A;  . LEFT HEART CATH AND CORONARY ANGIOGRAPHY Left 03/16/2019   Procedure: LEFT HEART CATH AND CORONARY ANGIOGRAPHY;  Surgeon: Dionisio David, MD;  Location: [facility];  Service: Cardiovascular;  Laterality: Left;    Prior to Admission medications   Medication Sig Start Date End Date Taking? Authorizing Provider  aspirin 81 MG tablet Take 81 mg by mouth daily.   Yes [provider]  clopidogrel (PLAVIX) 75 MG tablet Take 1 tablet (75 mg total) by mouth daily. 03/19/19  Yes Vaughan Basta, MD  ibuprofen (ADVIL,MOTRIN)  600 MG tablet Take 1 tablet (600 mg total) by mouth every 6 (six) hours as needed. 02/29/16  Yes Ward, Honor Loh, MD  isosorbide mononitrate (IMDUR) 30 MG 24 hr tablet Take 0.5 tablets (15 mg total) by mouth daily. 03/19/19  Yes Vaughan Basta, MD  metoprolol succinate (TOPROL-XL) 25 MG 24 hr tablet Take 1 tablet (25 mg total) by mouth daily. 03/19/19  Yes Vaughan Basta, MD  Multiple Vitamin (MULTIVITAMIN) tablet Take 1 tablet by mouth daily.   Yes [provider]  nitroGLYCERIN (NITROSTAT) 0.3 MG SL tablet USE AS DIRECTED AS NEEDED FOR CHEST PAIN 04/19/19  Yes [provider]  Omega-3 Fatty Acids (FISH OIL) 1000 MG CAPS Take 1 tablet by mouth daily.    Yes [provider]  rosuvastatin (CRESTOR) 40 MG tablet Take 40 mg by mouth daily.    Yes [provider]    Allergies as of 10/06/2019 - Review Complete 10/05/2019  Allergen Reaction Noted  . Ace inhibitors Cough 03/16/2019  . Neosporin wound cleanser [benzalkonium chloride] Rash and Other (See Comments) 04/11/2015    Family History  Problem Relation Age of Onset  . Hyperlipidemia Mother   . Hypertension Mother   . Heart disease Father        died from either MI or stroke  . Diabetes Brother        TYPE 2  . Heart disease Paternal Grandfather   . Diabetes Other        TYPE 2  .  Cancer Neg Hx   . Breast cancer Neg Hx     Social History   Socioeconomic History  . Marital status: Married    Spouse name: Not on file  . Number of children: 2  . Years of education: 58  . Highest education level: Not on file  Occupational History  . Occupation: Teacher    Comment: [employer] - 2[employer] GR[employer]  Social Needs  . Financial resource strain: Not on file  . Food insecurity    Worry: Not on file    Inability: Not on file  . Transportation needs    Medical: Not on file    Non-medical: Not on file  Tobacco Use  . Smoking status: Never Smoker  . Smokeless tobacco: Never Used  Substance and Sexual  Activity  . Alcohol use: Yes    Comment: occasionally  . Drug use: No  . Sexual activity: Yes    Partners: Male    Birth control/protection: I.U.D.  Lifestyle  . Physical activity    Days per week: Not on file    Minutes per session: Not on file  . Stress: Not on file  Relationships  . Social Herbalist on phone: Not on file    Gets together: Not on file    Attends religious service: Not on file    Active member of club or organization: Not on file    Attends meetings of clubs or organizations: Not on file    Relationship status: Not on file  . Intimate partner violence    Fear of current or ex partner: Not on file    Emotionally abused: Not on file    Physically abused: Not on file    Forced sexual activity: Not on file  Other Topics Concern  . Not on file  Social History Narrative  . Not on file    Review of Systems: See HPI, otherwise negative ROS  Physical Exam:  General:   Alert,  pleasant and cooperative in NAD Head:  Normocephalic and atraumatic. Neck:  Supple; no masses or thyromegaly. Lungs:  Clear throughout to auscultation, normal respiratory effort.    Heart:  +S1, +S2, Regular rate and rhythm, No edema. Abdomen:  Soft, nontender and nondistended. Normal bowel sounds, without guarding, and without rebound.   Neurologic:  Alert and  oriented x4;  grossly normal neurologically.  Impression/Plan: [name] is here for a colonoscopy to be performed for average risk screening.  Risks, benefits, limitations, and alternatives regarding  colonoscopy have been reviewed with the patient.  Questions have been answered.  All parties agreeable.   Virgel Manifold, MD  11/05/2019, 8:08 AM

## 2019-11-08 ENCOUNTER — Encounter: Payer: Self-pay | Admitting: Gastroenterology

## 2019-11-24 NOTE — Anesthesia Postprocedure Evaluation (Signed)
Anesthesia Post Note  Patient: Zoe [name]  Procedure(s) Performed: COLONOSCOPY WITH PROPOFOL (N/A )  Patient location during evaluation: PACU Anesthesia Type: General Level of consciousness: awake and alert Pain management: pain level controlled Vital Signs Assessment: post-procedure vital signs reviewed and stable Respiratory status: spontaneous breathing, nonlabored ventilation, respiratory function stable and patient connected to nasal cannula oxygen Cardiovascular status: blood pressure returned to baseline and stable Postop Assessment: no apparent nausea or vomiting Anesthetic complications: no     Last Vitals:  Vitals:   11/05/19 1001 11/05/19 1011  BP: 138/70 137/82  Pulse: 68 64  Resp: 19 16  Temp:    SpO2: 95% 97%    Last Pain:  Vitals:   11/05/19 1011  TempSrc:   PainSc: 0-No pain                 Molli Barrows

## 2020-02-12 ENCOUNTER — Ambulatory Visit: Payer: BC Managed Care – PPO | Attending: Internal Medicine

## 2020-02-12 DIAGNOSIS — Z23 Encounter for immunization: Secondary | ICD-10-CM | POA: Insufficient documentation

## 2020-02-12 NOTE — Progress Notes (Signed)
   Covid-19 Vaccination Clinic  Name:  Gaia Tetrault    MRN: [id number] DOB: 30-Aug-1966  02/12/2020  Ms. Parello was observed post Covid-19 immunization for 15 minutes without incidence. She was provided with Vaccine Information Sheet and instruction to access the V-Safe system.   Ms. Poole was instructed to call 911 with any severe reactions post vaccine: Marland Kitchen Difficulty breathing  . Swelling of your face and throat  . A fast heartbeat  . A bad rash all over your body  . Dizziness and weakness    Immunizations Administered    Name Date Dose VIS Date Route   Moderna COVID-19 Vaccine 02/12/2020  3:07 AM 0.5 mL 11/16/2019 Intramuscular   Manufacturer: Moderna   Lot: [id number]   [id number]

## 2020-03-11 ENCOUNTER — Ambulatory Visit: Payer: BC Managed Care – PPO | Attending: Internal Medicine

## 2020-03-11 DIAGNOSIS — Z23 Encounter for immunization: Secondary | ICD-10-CM

## 2020-03-11 NOTE — Progress Notes (Signed)
   Covid-19 Vaccination Clinic  Name:  Zoe [name]    MRN: [id number] DOB: [birth year]  03/11/2020  Ms. Denner was observed post Covid-19 immunization for 15 minutes without incident. She was provided with Vaccine Information Sheet and instruction to access the V-Safe system.   Ms. Wysocki was instructed to call 911 with any severe reactions post vaccine: Marland Kitchen Difficulty breathing  . Swelling of face and throat  . A fast heartbeat  . A bad rash all over body  . Dizziness and weakness   Immunizations Administered    Name Date Dose VIS Date Route   Moderna COVID-19 Vaccine 03/11/2020 12:08 PM 0.5 mL 11/16/2019 Intramuscular   Manufacturer: Levan Hurst   [id number]   [id number]

## 2020-07-12 ENCOUNTER — Encounter: Payer: Self-pay | Admitting: *Deleted

## 2020-07-12 ENCOUNTER — Encounter: Payer: BC Managed Care – PPO | Attending: Nurse Practitioner | Admitting: *Deleted

## 2020-07-12 ENCOUNTER — Other Ambulatory Visit: Payer: Self-pay

## 2020-07-12 VITALS — BP 120/86 | Ht 64.5 in | Wt 164.3 lb

## 2020-07-12 DIAGNOSIS — E109 Type 1 diabetes mellitus without complications: Secondary | ICD-10-CM | POA: Diagnosis present

## 2020-07-12 DIAGNOSIS — E119 Type 2 diabetes mellitus without complications: Secondary | ICD-10-CM

## 2020-07-12 NOTE — Patient Instructions (Signed)
Check blood sugars 1 x day before breakfast or 2 hrs after one meal every day Bring blood sugar records to the next class  Exercise: Continue walking for   60  minutes   3-5  days a week  Eat 3 meals day,  1-2  snacks a day Space meals 4-6 hours apart  Make an eye doctor appointment  Return for classes on:

## 2020-07-13 NOTE — Progress Notes (Signed)
Diabetes Self-Management Education  Visit Type: First/Initial  Appt. Start Time: 1105 Appt. End Time: 1200  07/12/2020  [name], identified by name and date of birth, is a 54 y.o. female with a diagnosis of Diabetes: Type 2.   ASSESSMENT  Blood pressure (!) 120/86, height 5' 4.5" (1.638 m), weight 164 lb 4.8 oz (74.5 kg). Body mass index is 27.77 kg/m.   Diabetes Self-Management Education - 07/12/20 1240      Visit Information   Visit Type First/Initial      Initial Visit   Diabetes Type Type 2    Are you currently following a meal plan? Yes    What type of meal plan do you follow? "eating less carbs"    Are you taking your medications as prescribed? Yes    Date Diagnosed 1 month ago      Health Coping   How would you rate your overall health? Poor      Psychosocial Assessment   Patient Belief/Attitude about Diabetes Motivated to manage diabetes   "uncertain as to what to eat"   Self-care barriers None    Self-management support Doctor's office    Patient Concerns Nutrition/Meal planning;Glycemic Control;Medication;Monitoring;Weight Control;Healthy Lifestyle    Special Needs None    Preferred Learning Style Auditory;Visual    Learning Readiness Change in progress    How often do you need to have someone help you when you read instructions, pamphlets, or other written materials from your doctor or pharmacy? 1 - Never    What is the last grade level you completed in school? masters      Pre-Education Assessment   Patient understands the diabetes disease and treatment process. Needs Instruction    Patient understands incorporating nutritional management into lifestyle. Needs Instruction    Patient undertands incorporating physical activity into lifestyle. Needs Instruction    Patient understands using medications safely. Needs Instruction    Patient understands monitoring blood glucose, interpreting and using results Needs Review    Patient understands  prevention, detection, and treatment of acute complications. Needs Instruction    Patient understands prevention, detection, and treatment of chronic complications. Needs Instruction    Patient understands how to develop strategies to address psychosocial issues. Needs Instruction    Patient understands how to develop strategies to promote health/change behavior. Needs Instruction      Complications   Last HgB A1C per patient/outside source 7.5 %   06/01/2020   How often do you check your blood sugar? 1-2 times/day    Fasting Blood glucose range (mg/dL) 70-129   Pt reports FBG's less than 120.   Have you had a dilated eye exam in the past 12 months? No    Have you had a dental exam in the past 12 months? Yes    Are you checking your feet? No      Dietary Intake   Breakfast boiled egg, omelet with veggies, fruit (peaches, blueberries, watermelon)    Lunch tuna packet, ham and cheese roll up, cereal with milk and strawberries   Snack (afternoon) almonds, pretzels and hummus, fruit    Dinner grilled chicken, beef, fish - tuna, salmon; little potatoes, peas, conr, green beans, little rice or pasta, broccoli, cauliflower, tomatoes, carrots, cuccumbers, zucchini, squash    Beverage(s) water, coffee      Exercise   Exercise Type Light (walking / raking leaves)    How many days per week to you exercise? 4    How many minutes per day do  you exercise? 60    Total minutes per week of exercise 240      Patient Education   Previous Diabetes Education No    Disease state  Definition of diabetes, type 1 and 2, and the diagnosis of diabetes;Factors that contribute to the development of diabetes    Nutrition management  Role of diet in the treatment of diabetes and the relationship between the three main macronutrients and blood glucose level;Food label reading, portion sizes and measuring food.;Reviewed blood glucose goals for pre and post meals and how to evaluate the patients' food intake on their  blood glucose level.    Physical activity and exercise  Role of exercise on diabetes management, blood pressure control and cardiac health.    Medications Reviewed patients medication for diabetes, action, purpose, timing of dose and side effects.    Monitoring Purpose and frequency of SMBG.;Taught/discussed recording of test results and interpretation of SMBG.;Identified appropriate SMBG and/or A1C goals.    Chronic complications Relationship between chronic complications and blood glucose control    Psychosocial adjustment Identified and addressed patients feelings and concerns about diabetes      Individualized Goals (developed by patient)   Reducing Risk Other (comment)   improve blood sugars, decrease medications, prevent diabetes complications, lose weight, lead a healthier lifestyle, become more fit     Outcomes   Expected Outcomes Demonstrated interest in learning. Expect positive outcomes    Future DMSE 2 months           Individualized Plan for Diabetes Self-Management Training:   Learning Objective:  Patient will have a greater understanding of diabetes self-management. Patient education plan is to attend individual and/or group sessions per assessed needs and concerns.   Plan:   Patient Instructions  Check blood sugars 1 x day before breakfast or 2 hrs after one meal every day Bring blood sugar records to the next class Exercise: Continue walking for   60  minutes   3-5  days a week Eat 3 meals day,  1-2  snacks a day Space meals 4-6 hours apart Make an eye doctor appointment  Expected Outcomes:  Demonstrated interest in learning. Expect positive outcomes  Education material provided:  General Meal Planning Guidelines Simple Meal Plan  If problems or questions, patient to contact team via:   Zoe Drilling, RN, [place] [phone]  Future DSME appointment: 2 months  August 28, 2020 for Diabetes Class 1

## 2020-08-28 ENCOUNTER — Encounter: Payer: Self-pay | Admitting: Dietician

## 2020-08-28 ENCOUNTER — Other Ambulatory Visit: Payer: Self-pay

## 2020-08-28 ENCOUNTER — Encounter: Payer: BC Managed Care – PPO | Attending: Nurse Practitioner | Admitting: Dietician

## 2020-08-28 VITALS — Ht 64.5 in | Wt 160.1 lb

## 2020-08-28 DIAGNOSIS — E109 Type 1 diabetes mellitus without complications: Secondary | ICD-10-CM | POA: Diagnosis present

## 2020-08-28 DIAGNOSIS — E119 Type 2 diabetes mellitus without complications: Secondary | ICD-10-CM

## 2020-08-28 NOTE — Progress Notes (Signed)
Appt. Start Time: 530p Appt. End Time: 830p  Class 1 Diabetes Overview - define DM; state own type of DM; identify functions of pancreas and insulin; define insulin deficiency vs insulin resistance  Psychosocial - identify DM as a source of stress; state the effects of stress on BG control  Nutritional Management - describe effects of food on blood glucose; identify sources of carbohydrate, protein and fat; verbalize the importance of balance meals in controlling blood glucose  Exercise - describe the effects of exercise on blood glucose and importance of regular exercise in controlling diabetes; state a plan for personal exercise; verbalize contraindications for exercise  Self-Monitoring - state importance of SMBG; use SMBG results to effectively manage diabetes; identify importance of regular HbA1C testing and goals for results  Acute Complications - recognize hyperglycemia and hypoglycemia with causes and effects; identify blood glucose results as high, low or in control; list steps in treating and preventing high and low blood glucose  Chronic Complications/Foot, Skin, Eye Dental Care - identify possible long-term complications of diabetes (retinopathy, neuropathy, nephropathy, cardiovascular disease, infections); state importance of daily self-foot exams; describe how to examine feet and what to look for; explain appropriate eye and dental care  Lifestyle Changes/Goals & Health/Community Resources - state benefits of making appropriate lifestyle changes; identify habits that need to change (meals, tobacco, alcohol); identify strategies to reduce risk factors for personal health  Pregnancy/Sexual Health - define gestational diabetes; state importance of good blood glucose control and birth control prior to pregnancy; state importance of good blood glucose control in preventing sexual problems (impotence, vaginal dryness, infections, loss of desire); state relationship of blood glucose control  and pregnancy outcome; describe risk of maternal and fetal complications  Teaching Materials Used: Class 1 Slides/Notebook Diabetes Booklet ID Card  Medic Alert/Medic ID Forms Sleep Evaluation Exercise Handout Planning a Balanced Meal Goals for Class 1  

## 2020-08-30 ENCOUNTER — Encounter: Payer: Self-pay | Admitting: Dermatology

## 2020-08-30 ENCOUNTER — Other Ambulatory Visit: Payer: Self-pay

## 2020-08-30 ENCOUNTER — Ambulatory Visit: Payer: BC Managed Care – PPO | Admitting: Dermatology

## 2020-08-30 DIAGNOSIS — D1800 Hemangioma unspecified site: Secondary | ICD-10-CM | POA: Diagnosis not present

## 2020-08-30 DIAGNOSIS — D2261 Melanocytic nevi of right upper limb, including shoulder: Secondary | ICD-10-CM

## 2020-08-30 DIAGNOSIS — D492 Neoplasm of unspecified behavior of bone, soft tissue, and skin: Secondary | ICD-10-CM

## 2020-08-30 DIAGNOSIS — D225 Melanocytic nevi of trunk: Secondary | ICD-10-CM

## 2020-08-30 DIAGNOSIS — D229 Melanocytic nevi, unspecified: Secondary | ICD-10-CM | POA: Diagnosis not present

## 2020-08-30 DIAGNOSIS — D239 Other benign neoplasm of skin, unspecified: Secondary | ICD-10-CM

## 2020-08-30 DIAGNOSIS — L821 Other seborrheic keratosis: Secondary | ICD-10-CM

## 2020-08-30 DIAGNOSIS — Z1283 Encounter for screening for malignant neoplasm of skin: Secondary | ICD-10-CM | POA: Diagnosis not present

## 2020-08-30 DIAGNOSIS — L814 Other melanin hyperpigmentation: Secondary | ICD-10-CM | POA: Diagnosis not present

## 2020-08-30 DIAGNOSIS — L578 Other skin changes due to chronic exposure to nonionizing radiation: Secondary | ICD-10-CM

## 2020-08-30 DIAGNOSIS — L918 Other hypertrophic disorders of the skin: Secondary | ICD-10-CM

## 2020-08-30 HISTORY — DX: Other benign neoplasm of skin, unspecified: D23.9

## 2020-08-30 NOTE — Patient Instructions (Signed)

## 2020-08-30 NOTE — Progress Notes (Signed)
New Patient Visit  Subjective  Zoe [name] is a 54 y.o. female who presents for the following: Annual Exam (Total body skin exam, no hx of skin ca). The patient presents for Total-Body Skin Exam (TBSE) for skin cancer screening and mole check.  The following portions of the chart were reviewed this encounter and updated as appropriate:  Tobacco  Allergies  Meds  Problems  Med Hx  Surg Hx  Fam Hx     Review of Systems:  No other skin or systemic complaints except as noted in HPI or Assessment and Plan.  Objective  Well appearing patient in no apparent distress; mood and affect are within normal limits.  A full examination was performed including scalp, head, eyes, ears, nose, lips, neck, chest, axillae, abdomen, back, buttocks, bilateral upper extremities, bilateral lower extremities, hands, feet, fingers, toes, fingernails, and toenails. All findings within normal limits unless otherwise noted below.  Objective  R lat distal deltoid: 0.4cm dark brown macule  Objective  Left low back post waistline: 0.7 x 0.4cm irregular brown macule   Assessment & Plan  Neoplasm of skin (2) R lat distal deltoid  Skin / nail biopsy Type of biopsy: tangential   Informed consent: discussed and consent obtained   Timeout: patient name, date of birth, surgical site, and procedure verified   Procedure prep:  Patient was prepped and draped in usual sterile fashion Prep type:  Isopropyl alcohol Anesthesia: the lesion was anesthetized in a standard fashion   Anesthetic:  1% lidocaine w/ epinephrine 1-100,000 buffered w/ 8.4% NaHCO3 Instrument used: flexible razor blade   Outcome: patient tolerated procedure well   Post-procedure details: sterile dressing applied and wound care instructions given   Dressing type: bandage and petrolatum    Specimen 1 - Surgical pathology Differential Diagnosis: D48.5 Nevus vs Dysplastic Nevus Check Margins: yes 0.4cm dark brown macule  Left low  back post waistline  Epidermal / dermal shaving  Lesion diameter (cm):  0.7 Informed consent: discussed and consent obtained   Timeout: patient name, date of birth, surgical site, and procedure verified   Procedure prep:  Patient was prepped and draped in usual sterile fashion Prep type:  Isopropyl alcohol Anesthesia: the lesion was anesthetized in a standard fashion   Anesthetic:  1% lidocaine w/ epinephrine 1-100,000 buffered w/ 8.4% NaHCO3 Instrument used: flexible razor blade   Hemostasis achieved with: pressure, aluminum chloride and electrodesiccation   Outcome: patient tolerated procedure well   Post-procedure details: sterile dressing applied and wound care instructions given   Dressing type: bandage and petrolatum    Specimen 2 - Surgical pathology Differential Diagnosis: D48.5 Nevus vs Dysplastic nevus Check Margins: yes 0.7 x 0.4cm irregular brown macule  Skin cancer screening   Lentigines - Scattered tan macules - Discussed due to sun exposure - Benign, observe - Call for any changes  Seborrheic Keratoses - Stuck-on, waxy, tan-brown papules and plaques  - Discussed benign etiology and prognosis. - Observe - Call for any changes  Melanocytic Nevi - Tan-brown and/or pink-flesh-colored symmetric macules and papules - Benign appearing on exam today - Observation - Call clinic for new or changing moles - Recommend daily use of broad spectrum spf 30+ sunscreen to sun-exposed areas.   Hemangiomas - Red papules - Discussed benign nature - Observe - Call for any changes  Actinic Damage - diffuse scaly erythematous macules with underlying dyspigmentation - Recommend daily broad spectrum sunscreen SPF 30+ to sun-exposed areas, reapply every 2 hours as needed.  -  Call for new or changing lesions.  Skin cancer screening performed today.  Acrochordons (Skin Tags) - Fleshy, skin-colored pedunculated papules - Benign appearing.  - Observe. - If desired, they  can be removed with an in office procedure that is not covered by insurance. - Please call the clinic if you notice any new or changing lesions.  Return in about 1 year (around 08/30/2021) for TBSE.   I, Othelia Pulling, RMA, am acting as scribe for Sarina Ser, MD .  Documentation: I have reviewed the above documentation for accuracy and completeness, and I agree with the above.  Sarina Ser, MD

## 2020-09-04 ENCOUNTER — Encounter: Payer: BC Managed Care – PPO | Admitting: *Deleted

## 2020-09-04 ENCOUNTER — Encounter: Payer: Self-pay | Admitting: *Deleted

## 2020-09-04 ENCOUNTER — Other Ambulatory Visit: Payer: Self-pay

## 2020-09-04 VITALS — Wt 160.5 lb

## 2020-09-04 DIAGNOSIS — E119 Type 2 diabetes mellitus without complications: Secondary | ICD-10-CM

## 2020-09-04 DIAGNOSIS — E109 Type 1 diabetes mellitus without complications: Secondary | ICD-10-CM | POA: Diagnosis not present

## 2020-09-04 NOTE — Progress Notes (Signed)

## 2020-09-05 ENCOUNTER — Telehealth: Payer: Self-pay

## 2020-09-05 NOTE — Telephone Encounter (Signed)
-----   Message from Ralene Bathe, MD sent at 09/04/2020 12:46 PM EDT ----- 1. Skin , right lat distal deltoid DYSPLASTIC JUNCTIONAL NEVUS WITH MODERATE ATYPIA, CLOSE TO MARGIN 2. Skin , left low back post waistline DYSPLASTIC COMPOUND NEVUS WITH MODERATE ATYPIA, CLOSE TO MARGIN  1& 2 - both dysplastic Moderate Recheck next visit If no appt make on by 1 year

## 2020-09-05 NOTE — Telephone Encounter (Signed)
LM on VM please return my call  

## 2020-09-05 NOTE — Telephone Encounter (Signed)
Spoke with pt and informed her of results. She had no concerns.

## 2020-09-11 ENCOUNTER — Encounter: Payer: Self-pay | Admitting: Dietician

## 2020-09-11 ENCOUNTER — Encounter: Payer: BC Managed Care – PPO | Admitting: Dietician

## 2020-09-11 ENCOUNTER — Other Ambulatory Visit: Payer: Self-pay

## 2020-09-11 VITALS — BP 111/83 | Ht 64.5 in | Wt 161.1 lb

## 2020-09-11 DIAGNOSIS — E119 Type 2 diabetes mellitus without complications: Secondary | ICD-10-CM

## 2020-09-11 DIAGNOSIS — E109 Type 1 diabetes mellitus without complications: Secondary | ICD-10-CM | POA: Diagnosis not present

## 2020-09-11 NOTE — Progress Notes (Signed)
Appt. Start Time: 530p Appt. End Time: 830p  Class 3 Diabetes Overview - identify functions of pancreas and insulin; define insulin deficiency vs insulin resistance  Medications - state name, dose, timing of currently prescribed medications; describe types of medications available for diabetes  Psychosocial - identify DM as a source of stress; state the effects of stress on BG control; verbalize appropriate stress management techniques; identify personal stress issues   Nutritional Management - use food labels to identify serving size, content of carbohydrate, fiber, protein, fat, saturated fat and sodium; recognize food sources of fat, saturated fat, trans fat, and sodium, and verbalize goals for intake; describe healthful, appropriate food choices when dining out   Exercise - state a plan for personal exercise; verbalize contraindications for exercise  Self-Monitoring - state importance of SMBG; use SMBG results to effectively manage diabetes; identify importance of regular HbA1C testing and goals for results  Acute Complications - recognize hyperglycemia and hypoglycemia with causes and effects; identify blood glucose results as high, low or in control; list steps in treating and preventing high and low blood glucose  Chronic Complications - state importance of daily self-foot exams; explain appropriate eye and dental care  Lifestyle Changes/Goals & Health/Community Resources - set goals for proper diabetes care; state need for and frequency of healthcare follow-up; describe appropriate community resources for good health (ADA, web sites, apps)   Teaching Materials Used: Class 3 Slide Packet Diabetes Stress Test Stress Management Tools Stress Poem Goal Setting Worksheet Website/App List    

## 2020-09-12 ENCOUNTER — Encounter: Payer: Self-pay | Admitting: *Deleted

## 2020-10-05 ENCOUNTER — Ambulatory Visit: Payer: BC Managed Care – PPO | Admitting: Obstetrics and Gynecology

## 2020-10-11 ENCOUNTER — Ambulatory Visit (INDEPENDENT_AMBULATORY_CARE_PROVIDER_SITE_OTHER): Payer: BC Managed Care – PPO | Admitting: Obstetrics and Gynecology

## 2020-10-11 ENCOUNTER — Encounter: Payer: Self-pay | Admitting: Obstetrics and Gynecology

## 2020-10-11 ENCOUNTER — Other Ambulatory Visit: Payer: Self-pay

## 2020-10-11 VITALS — BP 128/72 | Ht 64.5 in | Wt 159.8 lb

## 2020-10-11 DIAGNOSIS — Z01419 Encounter for gynecological examination (general) (routine) without abnormal findings: Secondary | ICD-10-CM | POA: Diagnosis not present

## 2020-10-11 DIAGNOSIS — Z0000 Encounter for general adult medical examination without abnormal findings: Secondary | ICD-10-CM | POA: Diagnosis not present

## 2020-10-11 DIAGNOSIS — Z1211 Encounter for screening for malignant neoplasm of colon: Secondary | ICD-10-CM

## 2020-10-11 DIAGNOSIS — Z30431 Encounter for routine checking of intrauterine contraceptive device: Secondary | ICD-10-CM

## 2020-10-11 DIAGNOSIS — Z1231 Encounter for screening mammogram for malignant neoplasm of breast: Secondary | ICD-10-CM

## 2020-10-11 DIAGNOSIS — N951 Menopausal and female climacteric states: Secondary | ICD-10-CM

## 2020-10-11 NOTE — Patient Instructions (Signed)
Institute of Medicine Recommended Dietary Allowances for Calcium and Vitamin D  Age (yr) Calcium Recommended Dietary Allowance (mg/day) Vitamin D Recommended Dietary Allowance (international units/day)  9-18 1,300 600  19-50 1,000 600  51-70 1,200 600  71 and older 1,200 800  Data from Institute of Medicine. Dietary reference intakes: calcium, vitamin D. [place], [place]: National Academies Press; 2011.     Exercising to Stay Healthy To become healthy and stay healthy, it is recommended that you do moderate-intensity and vigorous-intensity exercise. You can tell that you are exercising at a moderate intensity if your heart starts beating faster and you start breathing faster but can still hold a conversation. You can tell that you are exercising at a vigorous intensity if you are breathing much harder and faster and cannot hold a conversation while exercising. Exercising regularly is important. It has many health benefits, such as:  Improving overall fitness, flexibility, and endurance.  Increasing bone density.  Helping with weight control.  Decreasing body fat.  Increasing muscle strength.  Reducing stress and tension.  Improving overall health. How often should I exercise? Choose an activity that you enjoy, and set realistic goals. Your health care provider can help you make an activity plan that works for you. Exercise regularly as told by your health care provider. This may include:  Doing strength training two times a week, such as: ? Lifting weights. ? Using resistance bands. ? Push-ups. ? Sit-ups. ? Yoga.  Doing a certain intensity of exercise for a given amount of time. Choose from these options: ? A total of 150 minutes of moderate-intensity exercise every week. ? A total of 75 minutes of vigorous-intensity exercise every week. ? A mix of moderate-intensity and vigorous-intensity exercise every week. Children, pregnant women, people who have not exercised  regularly, people who are overweight, and older adults may need to talk with a health care provider about what activities are safe to do. If you have a medical condition, be sure to talk with your health care provider before you start a new exercise program. What are some exercise ideas? Moderate-intensity exercise ideas include:  Walking 1 mile (1.6 km) in about 15 minutes.  Biking.  Hiking.  Golfing.  Dancing.  Water aerobics. Vigorous-intensity exercise ideas include:  Walking 4.5 miles (7.2 km) or more in about 1 hour.  Jogging or running 5 miles (8 km) in about 1 hour.  Biking 10 miles (16.1 km) or more in about 1 hour.  Lap swimming.  Roller-skating or in-line skating.  Cross-country skiing.  Vigorous competitive sports, such as football, basketball, and soccer.  Jumping rope.  Aerobic dancing. What are some everyday activities that can help me to get exercise?  Yard work, such as: ? Pushing a lawn mower. ? Raking and bagging leaves.  Washing your car.  Pushing a stroller.  Shoveling snow.  Gardening.  Washing windows or floors. How can I be more active in my day-to-day activities?  Use stairs instead of an elevator.  Take a walk during your lunch break.  If you drive, park your car farther away from your work or school.  If you take public transportation, get off one stop early and walk the rest of the way.  Stand up or walk around during all of your indoor phone calls.  Get up, stretch, and walk around every 30 minutes throughout the day.  Enjoy exercise with a friend. Support to continue exercising will help you keep a regular routine of activity. What guidelines can   I follow while exercising?  Before you start a new exercise program, talk with your health care provider.  Do not exercise so much that you hurt yourself, feel dizzy, or get very short of breath.  Wear comfortable clothes and wear shoes with good support.  Drink plenty of  water while you exercise to prevent dehydration or heat stroke.  Work out until your breathing and your heartbeat get faster. Where to find more information  [place] Department of Health and Human Services: www.hhs.gov  Centers for Disease Control and Prevention (CDC): www.cdc.gov Summary  Exercising regularly is important. It will improve your overall fitness, flexibility, and endurance.  Regular exercise also will improve your overall health. It can help you control your weight, reduce stress, and improve your bone density.  Do not exercise so much that you hurt yourself, feel dizzy, or get very short of breath.  Before you start a new exercise program, talk with your health care provider. This information is not intended to replace advice given to you by your health care provider. Make sure you discuss any questions you have with your health care provider. Document Revised: 11/14/2017 Document Reviewed: 10/23/2017 Elsevier Patient Education  2020 Elsevier Inc.   Budget-Friendly Healthy Eating There are many ways to save money at the grocery store and continue to eat healthy. You can be successful if you:  Plan meals according to your budget.  Make a grocery list and only purchase food according to your grocery list.  Prepare food yourself. What are tips for following this plan?  Reading food labels  Compare food labels between brand name foods and the store brand. Often the nutritional value is the same, but the store brand is lower cost.  Look for products that do not have added sugar, fat, or salt (sodium). These often cost the same but are healthier for you. Products may be labeled as: ? Sugar-free. ? Nonfat. ? Low-fat. ? Sodium-free. ? Low-sodium.  Look for lean ground beef labeled as at least 92% lean and 8% fat. Shopping  Buy only the items on your grocery list and go only to the areas of the store that have the items on your list.  Use coupons only for foods  and brands you normally buy. Avoid buying items you wouldn't normally buy simply because they are on sale.  Check online and in newspapers for weekly deals.  Buy healthy items from the bulk bins when available, such as herbs, spices, flour, pasta, nuts, and dried fruit.  Buy fruits and vegetables that are in season. Prices are usually lower on in-season produce.  Look at the unit price on the price tag. Use it to compare different brands and sizes to find out which item is the best deal.  Choose healthy items that are often low-cost, such as carrots, potatoes, apples, bananas, and oranges. Dried or canned beans are a low-cost protein source.  Buy in bulk and freeze extra food. Items you can buy in bulk include meats, fish, poultry, frozen fruits, and frozen vegetables.  Avoid buying "ready-to-eat" foods, such as pre-cut fruits and vegetables and pre-made salads.  If possible, shop around to discover where you can find the best prices. Consider other retailers such as dollar stores, larger wholesale stores, local fruit and vegetable stands, and farmers markets.  Do not shop when you are hungry. If you shop while hungry, it may be hard to stick to your list and budget.  Resist impulse buying. Use your grocery list as   your official plan for the week.  Buy a variety of vegetables and fruits by purchasing fresh, frozen, and canned items.  Look at the top and bottom shelves for deals. Foods at eye level (eye level of an adult or child) are usually more expensive.  Be efficient with your time when shopping. The more time you spend at the store, the more money you are likely to spend.  To save money when choosing more expensive foods like meats and dairy: ? Choose cheaper cuts of meat, such as bone-in chicken thighs and drumsticks instead of skinless and boneless chicken. When you are ready to prepare the chicken, you can remove the skin yourself to make it healthier. ? Choose lean meats like  chicken or turkey instead of beef. ? Choose canned seafood, such as tuna, salmon, or sardines. ? Buy eggs as a low-cost source of protein. ? Buy dried beans and peas, such as lentils, split peas, or kidney beans instead of meats. Dried beans and peas are a good alternative source of protein. ? Buy the larger tubs of yogurt instead of individual-sized containers.  Choose water instead of sodas and other sweetened beverages.  Avoid buying chips, cookies, and other "junk food." These items are usually expensive and not healthy. Cooking  Make extra food and freeze the extras in meal-sized containers or in individual portions for fast meals and snacks.  Pre-cook on days when you have extra time to prepare meals in advance. You can keep these meals in the fridge or freezer and reheat for a quick meal.  When you come home from the grocery store, wash, peel, and cut fruits and vegetables so they are ready to use and eat. This will help reduce food waste. Meal planning  Do not eat out or get fast food. Prepare food at home.  Make a grocery list and make sure to bring it with you to the store. If you have a smart phone, you could use your phone to create your shopping list.  Plan meals and snacks according to a grocery list and budget you create.  Use leftovers in your meal plan for the week.  Look for recipes where you can cook once and make enough food for two meals.  Include budget-friendly meals like stews, casseroles, and stir-fry dishes.  Try some meatless meals or try "no cook" meals like salads.  Make sure that half your plate is filled with fruits or vegetables. Choose from fresh, frozen, or canned fruits and vegetables. If eating canned, remember to rinse them before eating. This will remove any excess salt added for packaging. Summary  Eating healthy on a budget is possible if you plan your meals according to your budget, purchase according to your budget and grocery list, and  prepare food yourself.  Tips for buying more food on a limited budget include buying generic brands, using coupons only for foods you normally buy, and buying healthy items from the bulk bins when available.  Tips for buying cheaper food to replace expensive food include choosing cheaper, lean cuts of meat, and buying dried beans and peas. This information is not intended to replace advice given to you by your health care provider. Make sure you discuss any questions you have with your health care provider. Document Revised: 12/03/2017 Document Reviewed: 12/03/2017 Elsevier Patient Education  2020 [employer] Inc.   Bone Health Bones protect organs, store calcium, anchor muscles, and support the whole body. Keeping your bones strong is important, especially as you   get older. You can take actions to help keep your bones strong and healthy. Why is keeping my bones healthy important?  Keeping your bones healthy is important because your body constantly replaces bone cells. Cells get old, and new cells take their place. As we age, we lose bone cells because the body may not be able to make enough new cells to replace the old cells. The amount of bone cells and bone tissue you have is referred to as bone mass. The higher your bone mass, the stronger your bones. The aging process leads to an overall loss of bone mass in the body, which can increase the likelihood of:  Joint pain and stiffness.  Broken bones.  A condition in which the bones become weak and brittle (osteoporosis). A large decline in bone mass occurs in older adults. In women, it occurs about the time of menopause. What actions can I take to keep my bones healthy? Good health habits are important for maintaining healthy bones. This includes eating nutritious foods and exercising regularly. To have healthy bones, you need to get enough of the right minerals and vitamins. Most nutrition experts recommend getting these nutrients from the  foods that you eat. In some cases, taking supplements may also be recommended. Doing certain types of exercise is also important for bone health. What are the nutritional recommendations for healthy bones?  Eating a well-balanced diet with plenty of calcium and vitamin D will help to protect your bones. Nutritional recommendations vary from person to person. Ask your health care provider what is healthy for you. Here are some general guidelines. Get enough calcium Calcium is the most important (essential) mineral for bone health. Most people can get enough calcium from their diet, but supplements may be recommended for people who are at risk for osteoporosis. Good sources of calcium include:  Dairy products, such as low-fat or nonfat milk, cheese, and yogurt.  Dark green leafy vegetables, such as bok choy and broccoli.  Calcium-fortified foods, such as orange juice, cereal, bread, soy beverages, and tofu products.  Nuts, such as almonds. Follow these recommended amounts for daily calcium intake:  Children, age 1-3: 700 mg.  Children, age 4-8: 1,000 mg.  Children, age 9-13: 1,300 mg.  Teens, age 14-18: 1,300 mg.  Adults, age 19-50: 1,000 mg.  Adults, age 51-70: ? Men: 1,000 mg. ? Women: 1,200 mg.  Adults, age 71 or older: 1,200 mg.  Pregnant and breastfeeding females: ? Teens: 1,300 mg. ? Adults: 1,000 mg. Get enough vitamin D Vitamin D is the most essential vitamin for bone health. It helps the body absorb calcium. Sunlight stimulates the skin to make vitamin D, so be sure to get enough sunlight. If you live in a cold climate or you do not get outside often, your health care provider may recommend that you take vitamin D supplements. Good sources of vitamin D in your diet include:  Egg yolks.  Saltwater fish.  Milk and cereal fortified with vitamin D. Follow these recommended amounts for daily vitamin D intake:  Children and teens, age 1-18: 600 international  units.  Adults, age 50 or younger: 400-800 international units.  Adults, age 51 or older: 800-1,000 international units. Get other important nutrients Other nutrients that are important for bone health include:  Phosphorus. This mineral is found in meat, poultry, dairy foods, nuts, and legumes. The recommended daily intake for adult men and adult women is 700 mg.  Magnesium. This mineral is found in seeds, nuts, dark   green vegetables, and legumes. The recommended daily intake for adult men is 400-420 mg. For adult women, it is 310-320 mg.  Vitamin K. This vitamin is found in green leafy vegetables. The recommended daily intake is 120 mg for adult men and 90 mg for adult women. What type of physical activity is best for building and maintaining healthy bones? Weight-bearing and strength-building activities are important for building and maintaining healthy bones. Weight-bearing activities cause muscles and bones to work against gravity. Strength-building activities increase the strength of the muscles that support bones. Weight-bearing and muscle-building activities include:  Walking and hiking.  Jogging and running.  Dancing.  Gym exercises.  Lifting weights.  Tennis and racquetball.  Climbing stairs.  Aerobics. Adults should get at least 30 minutes of moderate physical activity on most days. Children should get at least 60 minutes of moderate physical activity on most days. Ask your health care provider what type of exercise is best for you. How can I find out if my bone mass is low? Bone mass can be measured with an X-ray test called a bone mineral density (BMD) test. This test is recommended for all women who are age 65 or older. It may also be recommended for:  Men who are age 70 or older.  People who are at risk for osteoporosis because of: ? Having bones that break easily. ? Having a long-term disease that weakens bones, such as kidney disease or rheumatoid  arthritis. ? Having menopause earlier than normal. ? Taking medicine that weakens bones, such as steroids, thyroid hormones, or hormone treatment for breast cancer or prostate cancer. ? Smoking. ? Drinking three or more alcoholic drinks a day. If you find that you have a low bone mass, you may be able to prevent osteoporosis or further bone loss by changing your diet and lifestyle. Where can I find more information? For more information, check out the following websites:  National Osteoporosis Foundation: www.nof.org/patients  National Institutes of Health: www.bones.nih.gov  International Osteoporosis Foundation: www.iofbonehealth.org Summary  The aging process leads to an overall loss of bone mass in the body, which can increase the likelihood of broken bones and osteoporosis.  Eating a well-balanced diet with plenty of calcium and vitamin D will help to protect your bones.  Weight-bearing and strength-building activities are also important for building and maintaining strong bones.  Bone mass can be measured with an X-ray test called a bone mineral density (BMD) test. This information is not intended to replace advice given to you by your health care provider. Make sure you discuss any questions you have with your health care provider. Document Revised: 12/29/2017 Document Reviewed: 12/29/2017 Elsevier Patient Education  2020 [employer] Inc.   

## 2020-10-11 NOTE — Progress Notes (Signed)
Gynecology Annual Exam  PCP: Danelle Berry, NP  Chief Complaint:  Chief Complaint  Patient presents with  . Gynecologic Exam    History of Present Illness: Patient is a 54 y.o. [id number] presents for annual exam. The patient has no complaints today.   LMP: No LMP recorded. (Menstrual status: IUD). Reports minimal sporadic bleeding with IUD.   Reports new onset hot flashes sporadically, generally non-bothersome  The patient is sexually active. She currently uses IUD for contraception. She denies dyspareunia.  There is no notable family history of breast or ovarian cancer in her family.  The patient has regular exercise: yes.    The patient denies current symptoms of depression.    Review of Systems: Review of Systems  Constitutional: Negative for chills, fever, malaise/fatigue and weight loss.  HENT: Negative for congestion, hearing loss and sinus pain.   Eyes: Negative for blurred vision and double vision.  Respiratory: Negative for cough, sputum production, shortness of breath and wheezing.   Cardiovascular: Negative for chest pain, palpitations, orthopnea and leg swelling.  Gastrointestinal: Negative for abdominal pain, constipation, diarrhea, nausea and vomiting.  Genitourinary: Negative for dysuria, flank pain, frequency, hematuria and urgency.  Musculoskeletal: Negative for back pain, falls and joint pain.  Skin: Negative for itching and rash.  Neurological: Negative for dizziness and headaches.  Psychiatric/Behavioral: Negative for depression, substance abuse and suicidal ideas. The patient is not nervous/anxious.     Past Medical History:  Patient Active Problem List   Diagnosis Date Noted  . Encounter for screening colonoscopy   . Unstable angina ([place]) 03/16/2019    Added automatically from request for surgery [id number]   . NSTEMI (non-ST elevated myocardial infarction) ([place]) 03/16/2019  . IBS (irritable bowel syndrome)   . Hypertension   .  Hypercholesteremia   . GERD (gastroesophageal reflux disease)   . Coronary artery disease   . Allergic rhinitis     Past Surgical History:  Past Surgical History:  Procedure Laterality Date  . CARDIAC CATHETERIZATION    . COLONOSCOPY  2001  . COLONOSCOPY WITH PROPOFOL N/A 11/05/2019   Procedure: COLONOSCOPY WITH PROPOFOL;  Surgeon: Virgel Manifold, MD;  Location: [facility];  Service: Endoscopy;  Laterality: N/A;  . CORONARY ANGIOPLASTY    . DILATATION & CURETTAGE/HYSTEROSCOPY WITH MYOSURE N/A 02/29/2016   Procedure: DILATATION & CURETTAGE/HYSTEROSCOPY WITH MYOSURE;  Surgeon: Honor Loh Ward, MD;  Location: [facility] ORS;  Service: Gynecology;  Laterality: N/A;  . DILATION AND CURETTAGE OF UTERUS    . EAR CYST EXCISION Left 2015   IN OFFICE  . ESOPHAGOGASTRODUODENOSCOPY    . INTRAUTERINE DEVICE (IUD) INSERTION N/A 02/29/2016   Procedure: INTRAUTERINE DEVICE (IUD) INSERTION;  Surgeon: Honor Loh Ward, MD;  Location: [facility]S;  Service: Gynecology;  Laterality: N/A;  . LEFT HEART CATH AND CORONARY ANGIOGRAPHY Left 03/16/2019   Procedure: LEFT HEART CATH AND CORONARY ANGIOGRAPHY;  Surgeon: Dionisio David, MD;  Location: [facility];  Service: Cardiovascular;  Laterality: Left;    Gynecologic History:  No LMP recorded. (Menstrual status: IUD). Contraception: IUD Last Pap: Results were: 2019 NIL  Last mammogram: 2020 Results were: BI-RAD I  Obstetric History: [id number]  Family History:  Family History  Problem Relation Age of Onset  . Hyperlipidemia Mother   . Hypertension Mother   . Heart disease Father        died from either MI or stroke  . Diabetes Brother        TYPE 2  .  Heart disease Paternal Grandfather   . Diabetes Other        TYPE 2  . Cancer Neg Hx   . Breast cancer Neg Hx     Social History:  Social History   Socioeconomic History  . Marital status: Married    Spouse name: Not on file  . Number of children: 2  . Years of education: 37  .  Highest education level: Not on file  Occupational History  . 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 Smoking status: Never Smoker  . Smokeless tobacco: Never Used  Vaping Use  . Vaping Use: Never used  Substance and Sexual Activity  . Alcohol use: Yes    Alcohol/week: 3.0 - 5.0 standard drinks    Types: 3 - 5 Cans of beer per week    Comment: occasionally  . Drug use: No  . Sexual activity: Yes    Partners: Male    Birth control/protection: I.U.D.  Other Topics Concern  . Not on file  Social History Narrative  . Not on file   Social Determinants of Health   Financial Resource Strain:   . Difficulty of Paying Living Expenses: Not on file  Food Insecurity:   . Worried About Charity fundraiser in the Last Year: Not on file  . Ran Out of Food in the Last Year: Not on file  Transportation Needs:   . Lack of Transportation (Medical): Not on file  . Lack of Transportation (Non-Medical): Not on file  Physical Activity:   . Days of Exercise per Week: Not on file  . Minutes of Exercise per Session: Not on file  Stress:   . Feeling of Stress : Not on file  Social Connections:   . Frequency of Communication with Friends and Family: Not on file  . Frequency of Social Gatherings with Friends and Family: Not on file  . Attends Religious Services: Not on file  . Active Member of Clubs or Organizations: Not on file  . Attends Archivist Meetings: Not on file  . Marital Status: Not on file  Intimate Partner Violence:   . Fear of Current or Ex-Partner: Not on file  . Emotionally Abused: Not on file  . Physically Abused: Not on file  . Sexually Abused: Not on file    Allergies:  Allergies  Allergen Reactions  . Ace Inhibitors Cough    Patient cannot recall the specific ACE inhibitor that caused her cough.  . Neosporin Wound Cleanser [Benzalkonium Chloride] Rash and Other (See Comments)    Swelling / redness    Medications: Prior to  Admission medications   Medication Sig Start Date End Date Taking? Authorizing Provider  aspirin 81 MG tablet Take 81 mg by mouth daily.   Yes [provider]  Cholecalciferol (VITAMIN D-3 PO) Take 1,000 Units by mouth daily.   Yes [provider]  clopidogrel (PLAVIX) 75 MG tablet Take 1 tablet (75 mg total) by mouth daily. 03/19/19  Yes Vaughan Basta, MD  Coenzyme Q10 (CO Q 10 PO) Take 1 capsule by mouth daily.   Yes [provider]  ibuprofen (ADVIL,MOTRIN) 600 MG tablet Take 1 tablet (600 mg total) by mouth every 6 (six) hours as needed. 02/29/16  Yes Ward, Honor Loh, MD  isosorbide mononitrate (IMDUR) 30 MG 24 hr tablet Take 0.5 tablets (15 mg total) by mouth daily. 03/19/19  Yes Vaughan Basta, MD  losartan (COZAAR) 25 MG  tablet Take 25 mg by mouth daily. 06/22/20  Yes [provider]  metFORMIN (GLUCOPHAGE) 1000 MG tablet Take 1,000 mg by mouth 2 (two) times daily. 06/20/20  Yes [provider]  metoprolol succinate (TOPROL-XL) 25 MG 24 hr tablet Take 1 tablet (25 mg total) by mouth daily. 03/19/19  Yes Vaughan Basta, MD  Multiple Vitamin (MULTIVITAMIN) tablet Take 1 tablet by mouth daily.    Yes [provider]  nitroGLYCERIN (NITROSTAT) 0.3 MG SL tablet USE AS DIRECTED AS NEEDED FOR CHEST PAIN 04/19/19  Yes [provider]  Omega-3 Fatty Acids (FISH OIL) 1200 MG CAPS Take 1 capsule by mouth daily.   Yes [provider]  rosuvastatin (CRESTOR) 40 MG tablet Take 40 mg by mouth daily.    Yes [provider]    Physical Exam Vitals: Blood pressure 128/72, height 5' 4.5" (1.638 m), weight 159 lb 12.8 oz (72.5 kg).  General: NAD HEENT: normocephalic, anicteric Thyroid: no enlargement, no palpable nodules Pulmonary: No increased work of breathing, CTAB Cardiovascular: RRR, distal pulses 2+ Breast: Breast symmetrical, no tenderness, no palpable nodules or masses, no skin or nipple retraction  present, no nipple discharge.  No axillary or supraclavicular lymphadenopathy. Abdomen: NABS, soft, non-tender, non-distended.  Umbilicus without lesions.  No hepatomegaly, splenomegaly or masses palpable. No evidence of hernia  Genitourinary:  External: Normal external female genitalia.  Normal urethral meatus, normal Bartholin's and Skene's glands.    Vagina: Normal vaginal mucosa, no evidence of prolapse.    Cervix: Grossly normal in appearance, no bleeding- IUD strings present [facility])  Uterus: Non-enlarged, mobile, normal contour.  No CMT  Adnexa: ovaries non-enlarged, no adnexal masses  Rectal: deferred  Lymphatic: no evidence of inguinal lymphadenopathy Extremities: no edema, erythema, or tenderness Neurologic: Grossly intact Psychiatric: mood appropriate, affect full  Female chaperone present for pelvic and breast  portions of the physical exam    Assessment: 54 y.o. G2P2002 routine annual exam  Plan: Problem List Items Addressed This Visit    None    Visit Diagnoses    Encounter for annual routine gynecological examination    -  Primary   Health maintenance examination       Breast cancer screening by mammogram       Relevant Orders   MM 3D SCREEN BREAST BILATERAL   Colon cancer screening       Encounter for gynecological examination without abnormal finding       Perimenopausal vasomotor symptoms       Relevant Orders   FSH   Estradiol   IUD check up          1) Mammogram - recommend yearly screening mammogram.  Mammogram Was ordered today  2) STI screening  was not offered and therefore not obtained  3) ASCCP guidelines and rational discussed.  Patient opts for every 5 years screening interval  4) Contraception - the patient is currently using  IUD.  She is happy with her current form of contraception and plans to continue. IUD inserted in 2017.   5) Colonoscopy -performed in 2002, next dues 2030  6) Routine healthcare maintenance including cholesterol,  diabetes screening discussed managed by PCP  7) Given paperwork to check [facility] and estradiol to evaluate for menopause.   8) No follow-ups on file.   [facility], [facility] 10/11/2020, 8:01 PM

## 2020-11-01 ENCOUNTER — Ambulatory Visit
Admission: RE | Admit: 2020-11-01 | Discharge: 2020-11-01 | Disposition: A | Discharge status: Home / Self Care | Payer: BC Managed Care – PPO | Source: Ambulatory Visit | Attending: Obstetrics and Gynecology | Admitting: Obstetrics and Gynecology

## 2020-11-01 ENCOUNTER — Other Ambulatory Visit: Payer: Self-pay

## 2020-11-01 DIAGNOSIS — Z1231 Encounter for screening mammogram for malignant neoplasm of breast: Secondary | ICD-10-CM | POA: Diagnosis present

## 2020-11-07 ENCOUNTER — Other Ambulatory Visit: Payer: Self-pay | Admitting: Obstetrics and Gynecology

## 2020-11-07 DIAGNOSIS — R928 Other abnormal and inconclusive findings on diagnostic imaging of breast: Secondary | ICD-10-CM

## 2020-11-07 DIAGNOSIS — N6489 Other specified disorders of breast: Secondary | ICD-10-CM

## 2020-11-21 ENCOUNTER — Ambulatory Visit: Payer: BC Managed Care – PPO

## 2020-11-29 ENCOUNTER — Ambulatory Visit
Admission: RE | Admit: 2020-11-29 | Discharge: 2020-11-29 | Disposition: A | Discharge status: Home / Self Care | Payer: BC Managed Care – PPO | Source: Ambulatory Visit | Attending: Obstetrics and Gynecology | Admitting: Obstetrics and Gynecology

## 2020-11-29 ENCOUNTER — Other Ambulatory Visit: Payer: Self-pay

## 2020-11-29 DIAGNOSIS — N6489 Other specified disorders of breast: Secondary | ICD-10-CM

## 2020-11-29 DIAGNOSIS — R928 Other abnormal and inconclusive findings on diagnostic imaging of breast: Secondary | ICD-10-CM | POA: Diagnosis present

## 2021-08-01 ENCOUNTER — Ambulatory Visit: Payer: BC Managed Care – PPO | Admitting: Dermatology

## 2021-08-15 ENCOUNTER — Ambulatory Visit: Payer: BC Managed Care – PPO | Admitting: Dermatology

## 2021-08-15 ENCOUNTER — Other Ambulatory Visit: Payer: Self-pay

## 2021-08-15 DIAGNOSIS — L578 Other skin changes due to chronic exposure to nonionizing radiation: Secondary | ICD-10-CM

## 2021-08-15 DIAGNOSIS — Z1283 Encounter for screening for malignant neoplasm of skin: Secondary | ICD-10-CM | POA: Diagnosis not present

## 2021-08-15 DIAGNOSIS — D239 Other benign neoplasm of skin, unspecified: Secondary | ICD-10-CM

## 2021-08-15 DIAGNOSIS — D2371 Other benign neoplasm of skin of right lower limb, including hip: Secondary | ICD-10-CM

## 2021-08-15 DIAGNOSIS — L821 Other seborrheic keratosis: Secondary | ICD-10-CM

## 2021-08-15 DIAGNOSIS — L918 Other hypertrophic disorders of the skin: Secondary | ICD-10-CM

## 2021-08-15 DIAGNOSIS — Z86018 Personal history of other benign neoplasm: Secondary | ICD-10-CM

## 2021-08-15 DIAGNOSIS — D1800 Hemangioma unspecified site: Secondary | ICD-10-CM

## 2021-08-15 DIAGNOSIS — D229 Melanocytic nevi, unspecified: Secondary | ICD-10-CM

## 2021-08-15 DIAGNOSIS — L814 Other melanin hyperpigmentation: Secondary | ICD-10-CM

## 2021-08-15 DIAGNOSIS — D235 Other benign neoplasm of skin of trunk: Secondary | ICD-10-CM

## 2021-08-15 NOTE — Patient Instructions (Signed)
Wound Care Instructions  Cleanse wound gently with soap and water once a day then pat dry with clean gauze. Apply a thing coat of Petrolatum (petroleum jelly, "Vaseline") over the wound (unless you have an allergy to this). We recommend that you use a new, sterile tube of Vaseline. Do not pick or remove scabs. Do not remove the yellow or white "healing tissue" from the base of the wound.  Cover the wound with fresh, clean, nonstick gauze and secure with paper tape. You may use Band-Aids in place of gauze and tape if the would is small enough, but would recommend trimming much of the tape off as there is often too much. Sometimes Band-Aids can irritate the skin.  You should call the office for your biopsy report after 1 week if you have not already been contacted.  If you experience any problems, such as abnormal amounts of bleeding, swelling, significant bruising, significant pain, or evidence of infection, please call the office immediately.  FOR ADULT SURGERY PATIENTS: If you need something for pain relief you may take 1 extra strength Tylenol (acetaminophen) AND 2 Ibuprofen ('200mg'$  each) together every 4 hours as needed for pain. (do not take these if you are allergic to them or if you have a reason you should not take them.) Typically, you may only need pain medication for 1 to 3 days.    Melanoma ABCDEs  Melanoma is the most dangerous type of skin cancer, and is the leading cause of death from skin disease.  You are more likely to develop melanoma if you: Have light-colored skin, light-colored eyes, or red or blond hair Spend a lot of time in the sun Tan regularly, either outdoors or in a tanning bed Have had blistering sunburns, especially during childhood Have a close family member who has had a melanoma Have atypical moles or large birthmarks  Early detection of melanoma is key since treatment is typically straightforward and cure rates are extremely high if we catch it early.   The  first sign of melanoma is often a change in a mole or a new dark spot.  The ABCDE system is a way of remembering the signs of melanoma.  A for asymmetry:  The two halves do not match. B for border:  The edges of the growth are irregular. C for color:  A mixture of colors are present instead of an even brown color. D for diameter:  Melanomas are usually (but not always) greater than 21m - the size of a pencil eraser. E for evolution:  The spot keeps changing in size, shape, and color.  Please check your skin once per month between visits. You can use a small mirror in front and a large mirror behind you to keep an eye on the back side or your body.   If you see any new or changing lesions before your next follow-up, please call to schedule a visit.  Please continue daily skin protection including broad spectrum sunscreen SPF 30+ to sun-exposed areas, reapplying every 2 hours as needed when you're outdoors.   Staying in the shade or wearing long sleeves, sun glasses (UVA+UVB protection) and wide brim hats (4-inch brim around the entire circumference of the hat) are also recommended for sun protection.    If you have any questions or concerns for your doctor, please call our main line at [phone] press option 4 to reach your doctor's medical assistant. If no one answers, please leave a voicemail as directed and we  will return your call as soon as possible. Messages left after 4 pm will be answered the following business day.   You may also send [place] a message via [place]. We typically respond to MyChart messages within 1-2 business days.  For prescription refills, please ask your pharmacy to contact our office. Our fax number is [phone].  If you have an urgent issue when the clinic is closed that cannot wait until the next business day, you can page your doctor at the number below.    Please note that while we do our best to be available for urgent issues outside of office hours, we  are not available 24/7.   If you have an urgent issue and are unable to reach [place], you may choose to seek medical care at your doctor's office, retail clinic, urgent care center, or emergency room.  If you have a medical emergency, please immediately call 911 or go to the emergency department.  Pager Numbers  - Dr. Nehemiah Massed: [phone]  - Dr. Laurence Ferrari: [phone]  - Dr. Nicole Kindred: [phone]  In the event of inclement weather, please call our main line at [phone] for an update on the status of any delays or closures.  Dermatology Medication Tips: Please keep the boxes that topical medications come in in order to help keep track of the instructions about where and how to use these. Pharmacies typically print the medication instructions only on the boxes and not directly on the medication tubes.   If your medication is too expensive, please contact our office at [phone] option 4 or send [place] a message through [place].   We are unable to tell what your co-pay for medications will be in advance as this is different depending on your insurance coverage. However, we may be able to find a substitute medication at lower cost or fill out paperwork to get insurance to cover a needed medication.   If a prior authorization is required to get your medication covered by your insurance company, please allow [place] 1-2 business days to complete this process.  Drug prices often vary depending on where the prescription is filled and some pharmacies may offer cheaper prices.  The website www.goodrx.com contains coupons for medications through different pharmacies. The prices here do not account for what the cost may be with help from insurance (it may be cheaper with your insurance), but the website can give you the price if you did not use any insurance.  - You can print the associated coupon and take it with your prescription to the pharmacy.  - You may also stop by our office during regular business  hours and pick up a GoodRx coupon card.  - If you need your prescription sent electronically to a different pharmacy, notify our office through [facility] or by phone at [phone] option 4.

## 2021-08-15 NOTE — Progress Notes (Signed)
Follow-Up Visit   Subjective  Zoe [name] is a 55 y.o. female who presents for the following: Annual Exam (Hx of dysplastic nevi. No Hx skin cancer. Concerned with lesion on right medial leg. ).  She has some skin tags she would like removed.  Patient here for full body skin exam and skin cancer screening.   The following portions of the chart were reviewed this encounter and updated as appropriate:      Review of Systems: No other skin or systemic complaints except as noted in HPI or Assessment and Plan.   Objective  Well appearing patient in no apparent distress; mood and affect are within normal limits.  A full examination was performed including scalp, head, eyes, ears, nose, lips, neck, chest, axillae, abdomen, back, buttocks, bilateral upper extremities, bilateral lower extremities, hands, feet, fingers, toes, fingernails, and toenails. All findings within normal limits unless otherwise noted below.   Assessment & Plan   Lentigines - Scattered tan macules - Due to sun exposure - Benign-appering, observe - Recommend daily broad spectrum sunscreen SPF 30+ to sun-exposed areas, reapply every 2 hours as needed. - Call for any changes  Seborrheic Keratoses - Stuck-on, waxy, tan-brown papules and/or plaques  - Benign-appearing - Discussed benign etiology and prognosis. - Observe - Call for any changes  Melanocytic Nevi - Tan-brown and/or pink-flesh-colored symmetric macules and papules - Benign appearing on exam today - Observation - Call clinic for new or changing moles - Recommend daily use of broad spectrum spf 30+ sunscreen to sun-exposed areas.   Hemangiomas - Red papules - Discussed benign nature - Observe - Call for any changes  Actinic Damage - Chronic condition, secondary to cumulative UV/sun exposure - diffuse scaly erythematous macules with underlying dyspigmentation - Recommend daily broad spectrum sunscreen SPF 30+ to sun-exposed areas, reapply  every 2 hours as needed.  - Staying in the shade or wearing long sleeves, sun glasses (UVA+UVB protection) and wide brim hats (4-inch brim around the entire circumference of the hat) are also recommended for sun protection.  - Call for new or changing lesions.  Skin cancer screening performed today.  Dermatofibroma - Firm pink/brown papulenodule with dimple sign right lower back at waist line and medial right lower leg. -Benign growth possibly related to trauma, such as an insect bite.  Discussed removal (shave vrs excision), resulting scar and risk of recurrence. Since not bothersome, will observe for now.  - Call for any changes  History of Dysplastic Nevi - No evidence of recurrence today at right lateral distal deltoid and left lower back. - Recommend regular full body skin exams - Recommend daily broad spectrum sunscreen SPF 30+ to sun-exposed areas, reapply every 2 hours as needed.  - Call if any new or changing lesions are noted between office visits   Acrochordons (Skin Tags) - Removal desired by patient - Fleshy, skin-colored pedunculated papules x5 right axilla, x3 left axilla - Benign appearing.  - Patient desires removal. Reviewed that this is not covered by insurance and they will be charged a cosmetic fee for removal. Patient signed non-covered consent.  - Prior to the procedure, reviewed the expected small wound. Also reviewed the risk of leaving a small scar and the small risk of infection.  PROCEDURE - The areas were prepped with isopropyl alcohol. A small amount of lidocaine 1% with epinephrine was injected at the base of each lesion to achieve good local anesthesia. The skin tags were removed using a snip technique. Aluminum chloride was  used for hemostasis. Petrolatum and a bandage were applied. The procedure was tolerated well. - Wound care was reviewed with the patient. They were advised to call with any concerns. Total number of treated acrochordons 8.   Return for  TBSE in 1 year.   I, Emelia Salisbury, CMA, am acting as scribe for Brendolyn Patty, MD.  Documentation: I have reviewed the above documentation for accuracy and completeness, and I agree with the above.  Brendolyn Patty MD

## 2021-09-06 ENCOUNTER — Encounter: Payer: BC Managed Care – PPO | Admitting: Dermatology

## 2021-09-12 IMAGING — MG DIGITAL DIAGNOSTIC BILAT W/ TOMO W/ CAD
8 series · 8 of 24 positions shown · non-contrast
Comparison: Previous exam(s).

CLINICAL DATA: 53-year-old female recalled from screening mammogram
dated 11/01/2020 for a possible right breast asymmetry and possible
left breast distortion.

EXAM:
DIGITAL DIAGNOSTIC BILATERAL MAMMOGRAM WITH CAD AND TOMO
ULTRASOUND BILATERAL BREAST

[L ML synth-2D]
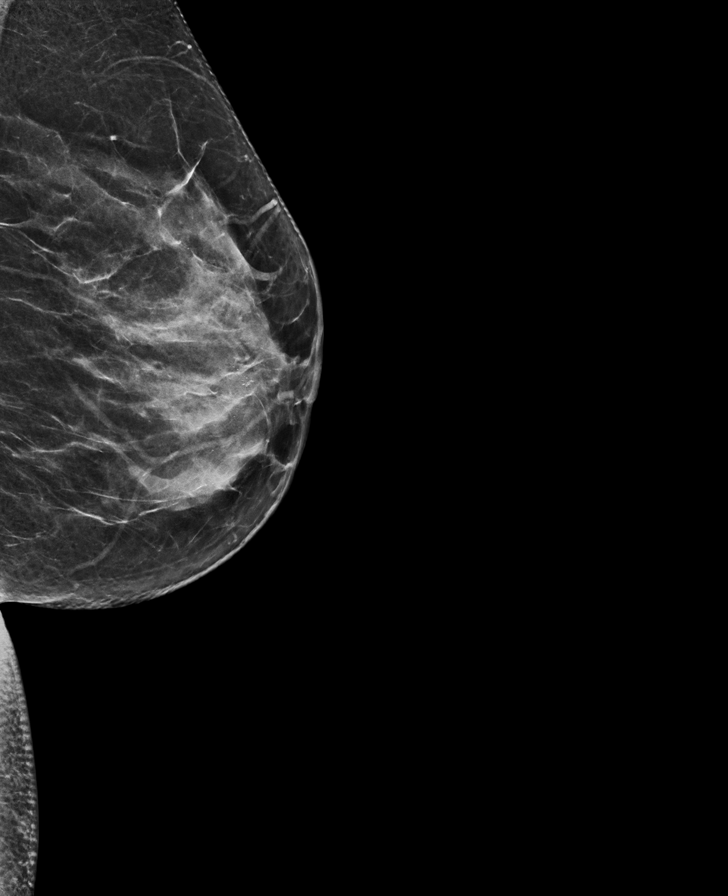

[R ML synth-2D]
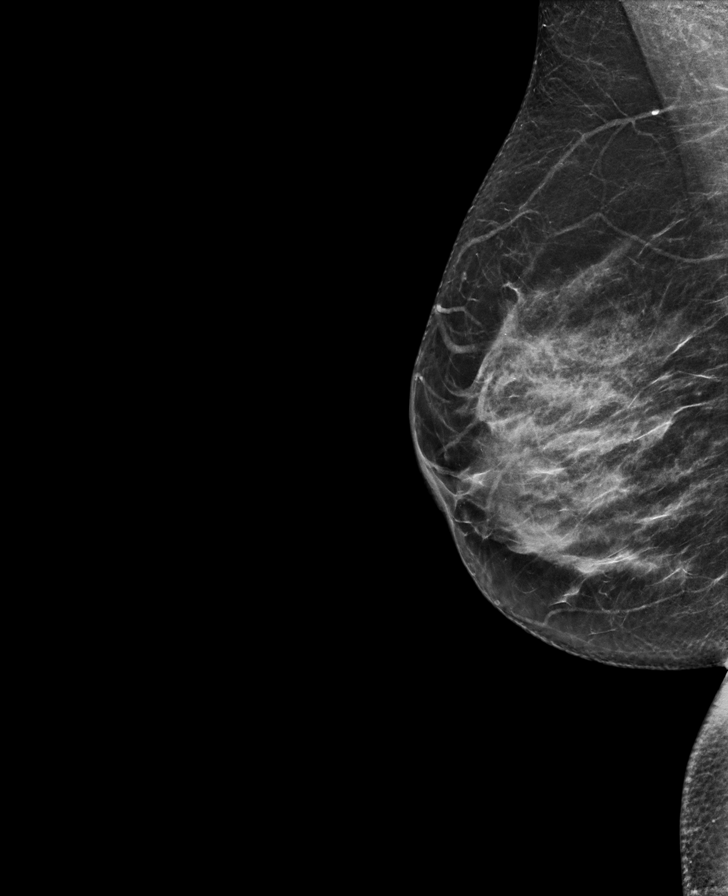

[L CC synth-2D]
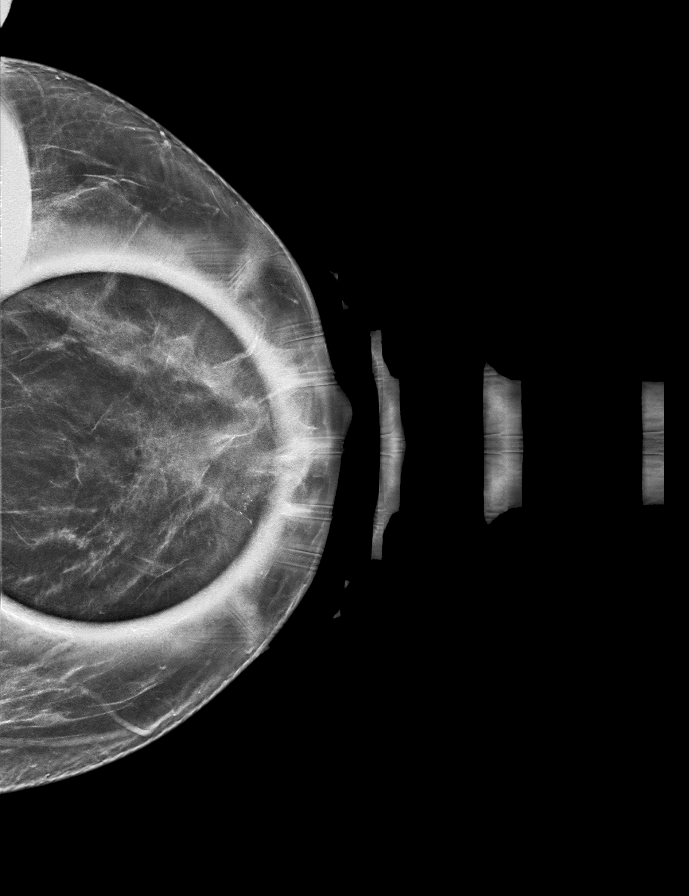

[R CC synth-2D]
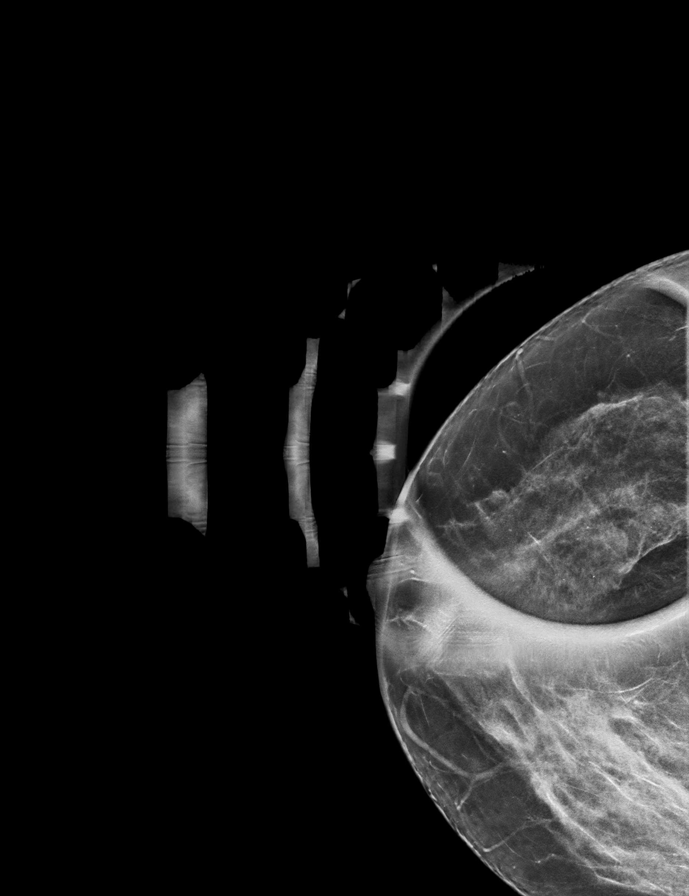

[L CC tomo · tomo slice 29/58.0]
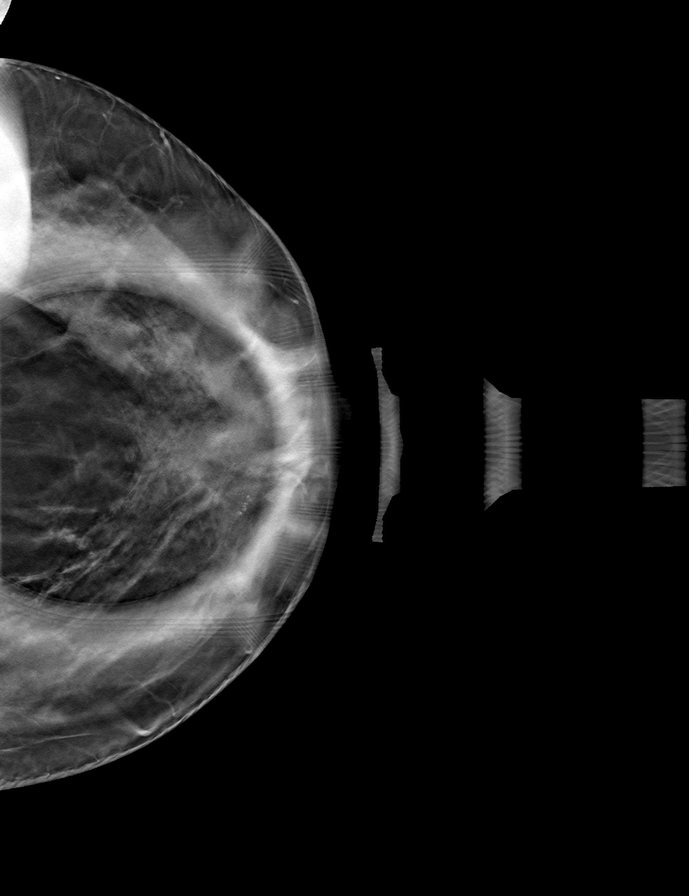

[R CC tomo · tomo slice 28/55.0]
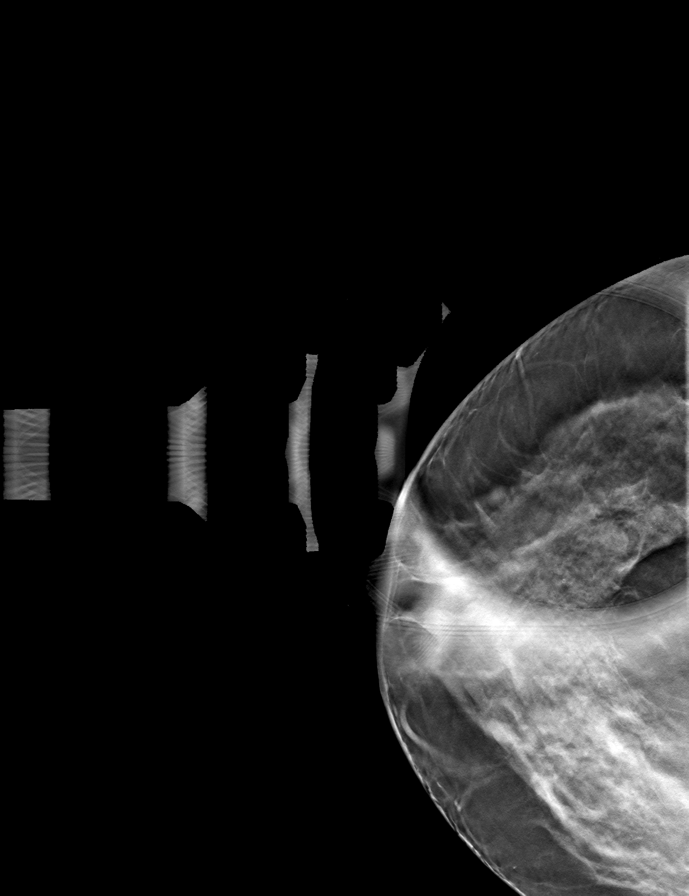

[R ML tomo · tomo slice 35/69.0]
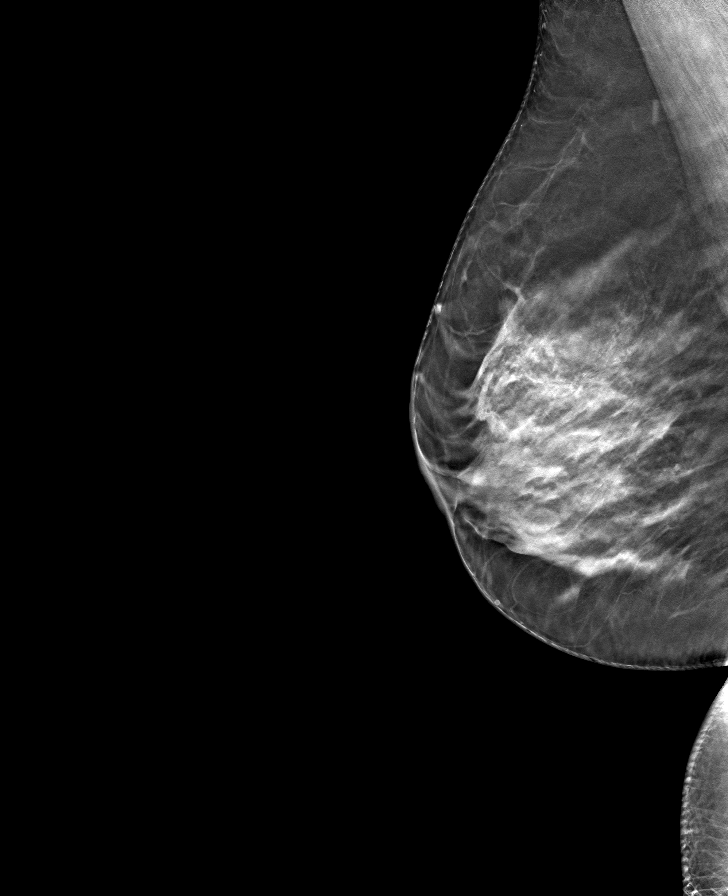

[L ML tomo · tomo slice 35/68.0]
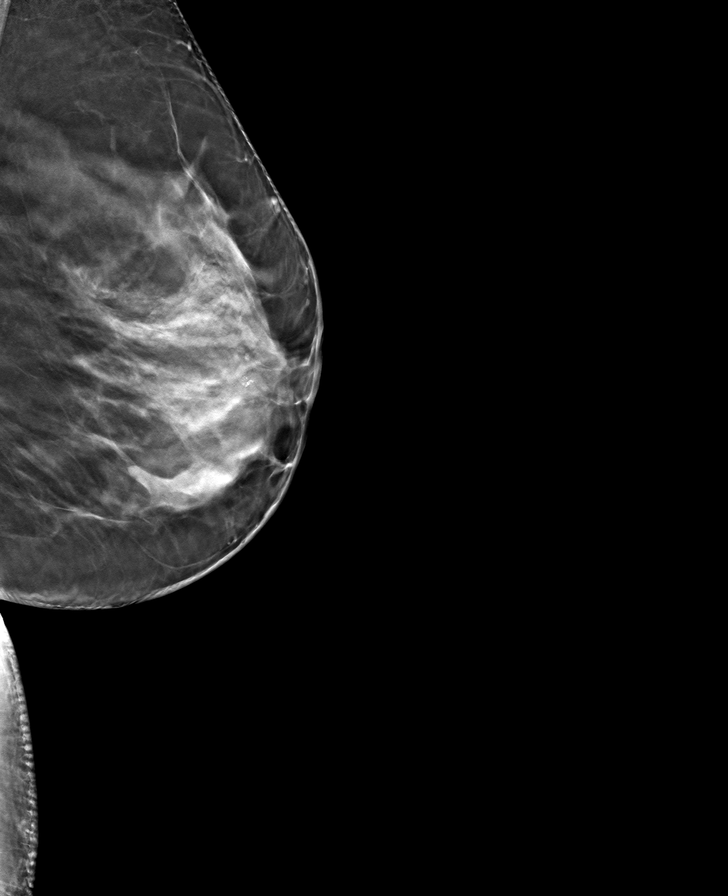

[8 of 24 positions shown; findings below may reference images not displayed]

ACR Breast Density Category c: The breast tissue is heterogeneously
dense, which may obscure small masses.
FINDINGS: Previously described, possible asymmetry persists as an oval,
partially circumscribed partially obscured mass in the lateral right
breast at middle depth. Further evaluation with ultrasound was
performed.

Previously described, possible distortion in the slightly medial
left breast on the CC projection only resolves into well dispersed
fibroglandular tissue on additional views. Precautionary ultrasound
evaluation was performed.

Mammographic images were processed with CAD.

Targeted ultrasound is performed, showing an oval, circumscribed
anechoic cyst at the 11 o'clock position 3 cm from the nipple on the
right. It measures 7 x 7 x 5 mm. There is no internal vascularity.
This correlates well with the mammographic finding and is consistent
with a benign cyst.

Evaluation of the medial left breast demonstrates no focal or
suspicious sonographic findings.
IMPRESSION: 1. Benign right breast simple cyst corresponding with the screening
mammographic findings. No further imaging follow-up required.
2. No suspicious mammographic or sonographic findings in the left
breast.

RECOMMENDATION:
Screening mammogram in one year.(Code:AO-T-VQZ)

I have discussed the findings and recommendations with the patient.
If applicable, a reminder letter will be sent to the patient
regarding the next appointment.

BI-RADS CATEGORY  2: Benign.

## 2021-10-17 ENCOUNTER — Ambulatory Visit (INDEPENDENT_AMBULATORY_CARE_PROVIDER_SITE_OTHER): Payer: BC Managed Care – PPO | Admitting: Obstetrics and Gynecology

## 2021-10-17 ENCOUNTER — Encounter: Payer: Self-pay | Admitting: Obstetrics and Gynecology

## 2021-10-17 ENCOUNTER — Other Ambulatory Visit: Payer: Self-pay

## 2021-10-17 ENCOUNTER — Other Ambulatory Visit (HOSPITAL_COMMUNITY)
Admission: RE | Admit: 2021-10-17 | Discharge: 2021-10-17 | Disposition: A | Discharge status: Home / Self Care | Payer: BC Managed Care – PPO | Source: Ambulatory Visit | Attending: Obstetrics and Gynecology | Admitting: Obstetrics and Gynecology

## 2021-10-17 VITALS — BP 110/70 | Ht 64.0 in | Wt 174.4 lb

## 2021-10-17 DIAGNOSIS — Z1239 Encounter for other screening for malignant neoplasm of breast: Secondary | ICD-10-CM

## 2021-10-17 DIAGNOSIS — Z124 Encounter for screening for malignant neoplasm of cervix: Secondary | ICD-10-CM

## 2021-10-17 DIAGNOSIS — Z0000 Encounter for general adult medical examination without abnormal findings: Secondary | ICD-10-CM | POA: Diagnosis not present

## 2021-10-17 DIAGNOSIS — N951 Menopausal and female climacteric states: Secondary | ICD-10-CM

## 2021-10-17 DIAGNOSIS — Z23 Encounter for immunization: Secondary | ICD-10-CM | POA: Diagnosis not present

## 2021-10-17 DIAGNOSIS — Z01419 Encounter for gynecological examination (general) (routine) without abnormal findings: Secondary | ICD-10-CM | POA: Diagnosis not present

## 2021-10-17 DIAGNOSIS — Z1231 Encounter for screening mammogram for malignant neoplasm of breast: Secondary | ICD-10-CM

## 2021-10-17 NOTE — Progress Notes (Signed)
Gynecology Annual Exam  PCP: Danelle Berry, NP  Chief Complaint:  Chief Complaint  Patient presents with   Gynecologic Exam    History of Present Illness: Patient is a 55 y.o. G2P2002 presents for annual exam. The patient has no complaints today.   LMP: No LMP recorded. (Menstrual status: IUD). Average Interval: irregular, generally amenorrheic with IUD Duration of flow: 0 days  Heavy Menses: no Dysmenorrhea: no  She denies passage of large clots She denies sensations of gushing or flooding of blood. She denies accidents where she bleeds through her clothing. She denies that she changes a saturated pad or tampon more frequently than every hour.  She denies that pain from her periods limits her activities.  The patient does not perform self breast exams.  There is no notable family history of breast or ovarian cancer in her family.  The patient has regular exercise: walking, infrequently  The patient denies current symptoms of depression.   PHQ-9: 8 GAD-7: 6   Review of Systems: Review of Systems  Constitutional:  Negative for chills, fever, malaise/fatigue and weight loss.  HENT:  Negative for congestion, hearing loss and sinus pain.   Eyes:  Negative for blurred vision and double vision.  Respiratory:  Negative for cough, sputum production, shortness of breath and wheezing.   Cardiovascular:  Negative for chest pain, palpitations, orthopnea and leg swelling.  Gastrointestinal:  Negative for abdominal pain, constipation, diarrhea, nausea and vomiting.  Genitourinary:  Negative for dysuria, flank pain, frequency, hematuria and urgency.  Musculoskeletal:  Negative for back pain, falls and joint pain.  Skin:  Negative for itching and rash.  Neurological:  Negative for dizziness and headaches.  Endo/Heme/Allergies:  Positive for environmental allergies. Bruises/bleeds easily.  Psychiatric/Behavioral:  Negative for depression, substance abuse and suicidal ideas. The  patient is not nervous/anxious.    Past Medical History:  Past Medical History:  Diagnosis Date   Allergic rhinitis    Asthma    Coronary artery disease    Diabetes mellitus without complication ([place])    Dysplastic nevi 08/30/2020   Right lateral distal deltoid, left lower back post waistline. Moderate atypia, close to margin   GERD (gastroesophageal reflux disease)    History of mammogram 07/18/10; 08/25/15   neg; neg   History of Papanicolaou smear of cervix 07/2013; 08/25/15   -/-; neg   Hypercholesteremia    Hypertension    IBS (irritable bowel syndrome)    Myocardial infarction ([place]) 02/2019    Past Surgical History:  Past Surgical History:  Procedure Laterality Date   CARDIAC CATHETERIZATION     COLONOSCOPY  2001   COLONOSCOPY WITH PROPOFOL N/A 11/05/2019   Procedure: COLONOSCOPY WITH PROPOFOL;  Surgeon: Virgel Manifold, MD;  Location: [facility];  Service: Endoscopy;  Laterality: N/A;   CORONARY ANGIOPLASTY     DILATATION & CURETTAGE/HYSTEROSCOPY WITH MYOSURE N/A 02/29/2016   Procedure: DILATATION & CURETTAGE/HYSTEROSCOPY WITH MYOSURE;  Surgeon: Honor Loh Ward, MD;  Location: [facility];  Service: Gynecology;  Laterality: N/A;   DILATION AND CURETTAGE OF UTERUS     EAR CYST EXCISION Left 2015   IN OFFICE   ESOPHAGOGASTRODUODENOSCOPY     INTRAUTERINE DEVICE (IUD) INSERTION N/A 02/29/2016   Procedure: INTRAUTERINE DEVICE (IUD) INSERTION;  Surgeon: Honor Loh Ward, MD;  Location: [facility];  Service: Gynecology;  Laterality: N/A;   LEFT HEART CATH AND CORONARY ANGIOGRAPHY Left 03/16/2019   Procedure: LEFT HEART CATH AND CORONARY ANGIOGRAPHY;  Surgeon: Dionisio David, MD;  Location: [place] [facility];  Service: Cardiovascular;  Laterality: Left;    Gynecologic History:  No LMP recorded. (Menstrual status: IUD). Menarche: 14  History of fibroids, polyps, or ovarian cysts? : yes  History of PCOS? no Hstory of Endometriosis? no History of abnormal pap smears? no Have  you had any sexually transmitted infections in the past?  no  Last Pap: 2019 NIL She identifies as a female. She is sexually active with men.   She denies dyspareunia. She denies postcoital bleeding.    Obstetric History: G2P2002  Family History:  Family History  Problem Relation Age of Onset   Hyperlipidemia Mother    Hypertension Mother    Heart disease Father        died from either MI or stroke   Diabetes Brother        TYPE 2   Heart disease Paternal Grandfather    Diabetes Other        TYPE 2   Cancer Neg Hx    Breast cancer Neg Hx     Social History:  Social History   Socioeconomic History   Marital status: Married    Spouse name: Not on file   Number of children: 2   Years of education: 18   Highest education level: Not on file  Occupational History   Occupation: Teacher    Comment: [employer] - 2[employer] GR[employer]  Tobacco Use   Smoking status: Never   Smokeless tobacco: Never  Vaping Use   Vaping Use: Never used  Substance and Sexual Activity   Alcohol use: Yes    Alcohol/week: 3.0 - 5.0 standard drinks    Types: 3 - 5 Cans of beer per week    Comment: occasionally   Drug use: No   Sexual activity: Yes    Partners: Male    Birth control/protection: I.U.D.  Other Topics Concern   Not on file  Social History Narrative   Not on file   Social Determinants of Health   Financial Resource Strain: Not on file  Food Insecurity: Not on file  Transportation Needs: Not on file  Physical Activity: Not on file  Stress: Not on file  Social Connections: Not on file  Intimate Partner Violence: Not on file    Allergies:  Allergies  Allergen Reactions   Ace Inhibitors Cough    Patient cannot recall the specific ACE inhibitor that caused her cough.   Neosporin Wound Cleanser [Benzalkonium Chloride] Rash and Other (See Comments)    Swelling / redness    Medications: Prior to Admission medications   Medication Sig Start Date End Date Taking? Authorizing Provider   aspirin 81 MG tablet Take 81 mg by mouth daily.   Yes [provider]  Cholecalciferol (VITAMIN D-3 PO) Take 1,000 Units by mouth daily.   Yes [provider]  clopidogrel (PLAVIX) 75 MG tablet Take 1 tablet (75 mg total) by mouth daily. 03/19/19  Yes Vaughan Basta, MD  Coenzyme Q10 (CO Q 10 PO) Take 1 capsule by mouth daily.   Yes [provider]  ibuprofen (ADVIL,MOTRIN) 600 MG tablet Take 1 tablet (600 mg total) by mouth every 6 (six) hours as needed. 02/29/16  Yes Ward, Honor Loh, MD  isosorbide mononitrate (IMDUR) 30 MG 24 hr tablet Take 0.5 tablets (15 mg total) by mouth daily. 03/19/19  Yes Vaughan Basta, MD  losartan (COZAAR) 25 MG tablet Take 25 mg by mouth daily. 06/22/20  Yes [provider]  metFORMIN (GLUCOPHAGE) 1000  MG tablet Take 1,000 mg by mouth 2 (two) times daily. 06/20/20  Yes [provider]  metoprolol succinate (TOPROL-XL) 25 MG 24 hr tablet Take 1 tablet (25 mg total) by mouth daily. 03/19/19  Yes Vaughan Basta, MD  Multiple Vitamin (MULTIVITAMIN) tablet Take 1 tablet by mouth daily.    Yes [provider]  Omega-3 Fatty Acids (FISH OIL) 1200 MG CAPS Take 1 capsule by mouth daily.   Yes [provider]  rosuvastatin (CRESTOR) 40 MG tablet Take 40 mg by mouth daily.    Yes [provider]  nitroGLYCERIN (NITROSTAT) 0.3 MG SL tablet USE AS DIRECTED AS NEEDED FOR CHEST PAIN 04/19/19   [provider]    Physical Exam Vitals: Blood pressure 110/70, height 5\' 4"  (1.626 m), weight 174 lb 6.4 oz (79.1 kg).  Physical Exam Constitutional:      Appearance: She is well-developed.  Genitourinary:     Genitourinary Comments: External: Normal appearing vulva. No lesions noted.  Speculum examination: Normal appearing cervix. No blood in the vaginal vault. No discharge.   IUD strings seen Bimanual examination: Uterus midline, non-tender, normal in size, shape and contour.  No CMT. No  adnexal masses. No adnexal tenderness. Pelvis not fixed.  Breast Exam: breast equal without skin changes, nipple discharge, breast lump or enlarged lymph nodes. Patient reports bilateral breast tenderness   HENT:     Head: Normocephalic and atraumatic.  Neck:     Thyroid: No thyromegaly.  Cardiovascular:     Rate and Rhythm: Normal rate and regular rhythm.     Heart sounds: Normal heart sounds.  Pulmonary:     Effort: Pulmonary effort is normal.     Breath sounds: Normal breath sounds.  Abdominal:     General: Bowel sounds are normal. There is no distension.     Palpations: Abdomen is soft. There is no mass.  Musculoskeletal:     Cervical back: Neck supple.  Neurological:     Mental Status: She is alert and oriented to person, place, and time.  Skin:    General: Skin is warm and dry.  Psychiatric:        Behavior: Behavior normal.        Thought Content: Thought content normal.        Judgment: Judgment normal.  Vitals reviewed.     Female chaperone present for pelvic and breast  portions of the physical exam  Assessment: 55 y.o. G2P2002 routine annual exam  Plan: Problem List Items Addressed This Visit   None Visit Diagnoses     Vasomotor symptoms due to menopause    -  Primary   Relevant Orders   FSH   Estradiol   Need for immunization against influenza       Relevant Orders   Flu Vaccine QUAD 17mo+IM (Fluarix, Fluzone & Alfiuria Quad PF) (Completed)       1) Mammogram - recommend yearly screening mammogram.  Mammogram was ordered today.  2) STI screening was offered and declined  3) Pap smear today.  4) Contraception - continue with IUD. Liletta inserted in 2017- Plan removal March 2023.   5) Colonoscopy --   6) Routine healthcare maintenance including cholesterol, diabetes screening discussed managed by PCP  7) Labs today to check menopausal status. Patient having vhot flashes.    Adrian Prows MD, [facility], [facility] 10/17/2021 4:49 PM

## 2021-10-17 NOTE — Patient Instructions (Signed)
Institute of Medicine Recommended Dietary Allowances for Calcium and Vitamin D  Age (yr) Calcium Recommended Dietary Allowance (mg/day) Vitamin D Recommended Dietary Allowance (international units/day)  9-18 1,300 600  19-50 1,000 600  51-70 1,200 600  71 and older 1,200 800  Data from Institute of Medicine. Dietary reference intakes: calcium, vitamin D. [place], [place]: National Academies Press; 2011.   Exercising to Stay Healthy To become healthy and stay healthy, it is recommended that you do moderate-intensity and vigorous-intensity exercise. You can tell that you are exercising at a moderate intensity if your heart starts beating faster and you start breathing faster but can still hold a conversation. You can tell that you are exercising at a vigorous intensity if you are breathing much harder and faster and cannot hold a conversation while exercising. How can exercise benefit me? Exercising regularly is important. It has many health benefits, such as: Improving overall fitness, flexibility, and endurance. Increasing bone density. Helping with weight control. Decreasing body fat. Increasing muscle strength and endurance. Reducing stress and tension, anxiety, depression, or anger. Improving overall health. What guidelines should I follow while exercising? Before you start a new exercise program, talk with your health care provider. Do not exercise so much that you hurt yourself, feel dizzy, or get very short of breath. Wear comfortable clothes and wear shoes with good support. Drink plenty of water while you exercise to prevent dehydration or heat stroke. Work out until your breathing and your heartbeat get faster (moderate intensity). How often should I exercise? Choose an activity that you enjoy, and set realistic goals. Your health care provider can help you make an activity plan that is individually designed and works best for you. Exercise regularly as told by your health  care provider. This may include: Doing strength training two times a week, such as: Lifting weights. Using resistance bands. Push-ups. Sit-ups. Yoga. Doing a certain intensity of exercise for a given amount of time. Choose from these options: A total of 150 minutes of moderate-intensity exercise every week. A total of 75 minutes of vigorous-intensity exercise every week. A mix of moderate-intensity and vigorous-intensity exercise every week. Children, pregnant women, people who have not exercised regularly, people who are overweight, and older adults may need to talk with a health care provider about what activities are safe to perform. If you have a medical condition, be sure to talk with your health care provider before you start a new exercise program. What are some exercise ideas? Moderate-intensity exercise ideas include: Walking 1 mile (1.6 km) in about 15 minutes. Biking. Hiking. Golfing. Dancing. Water aerobics. Vigorous-intensity exercise ideas include: Walking 4.5 miles (7.2 km) or more in about 1 hour. Jogging or running 5 miles (8 km) in about 1 hour. Biking 10 miles (16.1 km) or more in about 1 hour. Lap swimming. Roller-skating or in-line skating. Cross-country skiing. Vigorous competitive sports, such as football, basketball, and soccer. Jumping rope. Aerobic dancing. What are some everyday activities that can help me get exercise? Yard work, such as: Pushing a lawn mower. Raking and bagging leaves. Washing your car. Pushing a stroller. Shoveling snow. Gardening. Washing windows or floors. How can I be more active in my day-to-day activities? Use stairs instead of an elevator. Take a walk during your lunch break. If you drive, park your car farther away from your work or school. If you take public transportation, get off one stop early and walk the rest of the way. Stand up or walk around during all of   your indoor phone calls. Get up, stretch, and walk  around every 30 minutes throughout the day. Enjoy exercise with a friend. Support to continue exercising will help you keep a regular routine of activity. Where to find more information You can find more information about exercising to stay healthy from: U.S. Department of Health and Human Services: www.hhs.gov Centers for Disease Control and Prevention (CDC): www.cdc.gov Summary Exercising regularly is important. It will improve your overall fitness, flexibility, and endurance. Regular exercise will also improve your overall health. It can help you control your weight, reduce stress, and improve your bone density. Do not exercise so much that you hurt yourself, feel dizzy, or get very short of breath. Before you start a new exercise program, talk with your health care provider. This information is not intended to replace advice given to you by your health care provider. Make sure you discuss any questions you have with your health care provider. Document Revised: 03/30/2021 Document Reviewed: 03/30/2021 Elsevier Patient Education  2022 Elsevier Inc. Budget-Friendly Healthy Eating There are many ways to save money at the grocery store and continue to eat healthy. You can be successful if you: Plan meals according to your budget. Make a grocery list and only purchase food according to your grocery list. Prepare food yourself at home. What are tips for following this plan? Reading food labels Compare food labels between brand name foods and the store brand. Often the nutritional value is the same, but the store brand is lower cost. Look for products that do not have added sugar, fat, or salt (sodium). These often cost the same but are healthier for you. Products may be labeled as: Sugar-free. Nonfat. Low-fat. Sodium-free. Low-sodium. Look for lean ground beef labeled as at least 92% lean and 8% fat. Shopping  Buy only the items on your grocery list and go only to the areas of the store  that have the items on your list. Use coupons only for foods and brands you normally buy. Avoid buying items you wouldn't normally buy simply because they are on sale. Check online and in newspapers for weekly deals. Buy healthy items from the bulk bins when available, such as herbs, spices, flour, pasta, nuts, and dried fruit. Buy fruits and vegetables that are in season. Prices are usually lower on in-season produce. Look at the unit price on the price tag. Use it to compare different brands and sizes to find out which item is the best deal. Choose healthy items that are often low-cost, such as carrots, potatoes, apples, bananas, and oranges. Dried or canned beans are a low-cost protein source. Buy in bulk and freeze extra food. Items you can buy in bulk include meats, fish, poultry, frozen fruits, and frozen vegetables. Avoid buying "ready-to-eat" foods, such as pre-cut fruits and vegetables and pre-made salads. If possible, shop around to discover where you can find the best prices. Consider other retailers such as dollar stores, larger wholesale stores, local fruit and vegetable stands, and farmers markets. Do not shop when you are hungry. If you shop while hungry, it may be hard to stick to your list and budget. Resist impulse buying. Use your grocery list as your official plan for the week. Buy a variety of vegetables and fruits by purchasing fresh, frozen, and canned items. Look at the top and bottom shelves for deals. Foods at eye level (eye level of an adult or child) are usually more expensive. Be efficient with your time when shopping. The more time you   spend at the store, the more money you are likely to spend. To save money when choosing more expensive foods like meats and dairy: Choose cheaper cuts of meat, such as bone-in chicken thighs and drumsticks instead of skinless and boneless chicken. When you are ready to prepare the chicken, you can remove the skin yourself to make it  healthier. Choose lean meats like chicken or turkey instead of beef. Choose canned seafood, such as tuna, salmon, or sardines. Buy eggs as a low-cost source of protein. Buy dried beans and peas, such as lentils, split peas, or kidney beans instead of meats. Dried beans and peas are a good alternative source of protein. Buy the larger tubs of yogurt instead of individual-sized containers. Choose water instead of sodas and other sweetened beverages. Avoid buying chips, cookies, and other "junk food." These items are usually expensive and not healthy. Cooking Make extra food and freeze the extras in meal-sized containers or in individual portions for fast meals and snacks. Pre-cook on days when you have extra time to prepare meals in advance. You can keep these meals in the fridge or freezer and reheat for a quick meal. When you come home from the grocery store, wash, peel, and cut fruits and vegetables so they are ready to use and eat. This will help reduce food waste. Meal planning Do not eat out or get fast food. Prepare food at home. Make a grocery list and make sure to bring it with you to the store. If you have a smart phone, you could use your phone to create your shopping list. Plan meals and snacks according to a grocery list and budget you create. Use leftovers in your meal plan for the week. Look for recipes where you can cook once and make enough food for two meals. Prepare budget-friendly types of meals like stews, casseroles, and stir-fry dishes. Try some meatless meals or try "no cook" meals like salads. Make sure that half your plate is filled with fruits or vegetables. Choose from fresh, frozen, or canned fruits and vegetables. If eating canned, remember to rinse them before eating. This will remove any excess salt added for packaging. Summary Eating healthy on a budget is possible if you plan your meals according to your budget, purchase according to your budget and grocery list,  and prepare food yourself. Tips for buying more food on a limited budget include buying generic brands, using coupons only for foods you normally buy, and buying healthy items from the bulk bins when available. Tips for buying cheaper food to replace expensive food include choosing cheaper, lean cuts of meat, and buying dried beans and peas. This information is not intended to replace advice given to you by your health care provider. Make sure you discuss any questions you have with your health care provider. Document Revised: 09/14/2020 Document Reviewed: 09/14/2020 Elsevier Patient Education  2022 Elsevier Inc. Bone Health Bones protect organs, store calcium, anchor muscles, and support the whole body. Keeping your bones strong is important, especially as you get older. You can take actions to help keep your bones strong and healthy. Why is keeping my bones healthy important? Keeping your bones healthy is important because your body constantly replaces bone cells. Cells get old, and new cells take their place. As we age, we lose bone cells because the body may not be able to make enough new cells to replace the old cells. The amount of bone cells and bone tissue you have is referred to as   bone mass. The higher your bone mass, the stronger your bones. The aging process leads to an overall loss of bone mass in the body, which can increase the likelihood of: Joint pain and stiffness. Broken bones. A condition in which the bones become weak and brittle (osteoporosis). A large decline in bone mass occurs in older adults. In women, it occurs about the time of menopause. What actions can I take to keep my bones healthy? Good health habits are important for maintaining healthy bones. This includes eating nutritious foods and exercising regularly. To have healthy bones, you need to get enough of the right minerals and vitamins. Most nutrition experts recommend getting these nutrients from the foods that  you eat. In some cases, taking supplements may also be recommended. Doing certain types of exercise is also important for bone health. What are the nutritional recommendations for healthy bones? Eating a well-balanced diet with plenty of calcium and vitamin D will help to protect your bones. Nutritional recommendations vary from person to person. Ask your health care provider what is healthy for you. Here are some general guidelines. Get enough calcium Calcium is the most important (essential) mineral for bone health. Most people can get enough calcium from their diet, but supplements may be recommended for people who are at risk for osteoporosis. Good sources of calcium include: Dairy products, such as low-fat or nonfat milk, cheese, and yogurt. Dark green leafy vegetables, such as bok choy and broccoli. Calcium-fortified foods, such as orange juice, cereal, bread, soy beverages, and tofu products. Nuts, such as almonds. Follow these recommended amounts for daily calcium intake: Children, age 20-3: 700 mg. Children, age 652-8: 1,000 mg. Children, age 76-13: 1,300 mg. Teens, age 85-18: 1,300 mg. Adults, age 38-50: 1,000 mg. Adults, age 206-70: Men: 1,000 mg. Women: 1,200 mg. Adults, age 73 or older: 1,200 mg. Pregnant and breastfeeding females: Teens: 1,300 mg. Adults: 1,000 mg. Get enough vitamin D Vitamin D is the most essential vitamin for bone health. It helps the body absorb calcium. Sunlight stimulates the skin to make vitamin D, so be sure to get enough sunlight. If you live in a cold climate or you do not get outside often, your health care provider may recommend that you take vitamin D supplements. Good sources of vitamin D in your diet include: Egg yolks. Saltwater fish. Milk and cereal fortified with vitamin D. Follow these recommended amounts for daily vitamin D intake: Children and teens, age 20-18: 600 international units. Adults, age 53 or younger: 400-800 international  units. Adults, age 61 or older: 800-1,000 international units. Get other important nutrients Other nutrients that are important for bone health include: Phosphorus. This mineral is found in meat, poultry, dairy foods, nuts, and legumes. The recommended daily intake for adult men and adult women is 700 mg. Magnesium. This mineral is found in seeds, nuts, dark green vegetables, and legumes. The recommended daily intake for adult men is 400-420 mg. For adult women, it is 310-320 mg. Vitamin K. This vitamin is found in green leafy vegetables. The recommended daily intake is 120 mg for adult men and 90 mg for adult women. What type of physical activity is best for building and maintaining healthy bones? Weight-bearing and strength-building activities are important for building and maintaining healthy bones. Weight-bearing activities cause muscles and bones to work against gravity. Strength-building activities increase the strength of the muscles that support bones. Weight-bearing and muscle-building activities include: Walking and hiking. Jogging and running. Dancing. Gym exercises. Lifting weights. Tennis  and racquetball. Climbing stairs. Aerobics. Adults should get at least 30 minutes of moderate physical activity on most days. Children should get at least 60 minutes of moderate physical activity on most days. Ask your health care provider what type of exercise is best for you. How can I find out if my bone mass is low? Bone mass can be measured with an X-ray test called a bone mineral density (BMD) test. This test is recommended for all women who are age 90 or older. It may also be recommended for: Men who are age 62 or older. People who are at risk for osteoporosis because of: Having bones that break easily. Having a long-term disease that weakens bones, such as kidney disease or rheumatoid arthritis. Having menopause earlier than normal. Taking medicine that weakens bones, such as steroids,  thyroid hormones, or hormone treatment for breast cancer or prostate cancer. Smoking. Drinking three or more alcoholic drinks a day. If you find that you have a low bone mass, you may be able to prevent osteoporosis or further bone loss by changing your diet and lifestyle. Where can I find more information? For more information, check out the following websites: Bureau: [employer] of Health: www.bones.SouthExposed.es International Osteoporosis Foundation: Administrator.iofbonehealth.org Summary The aging process leads to an overall loss of bone mass in the body, which can increase the likelihood of broken bones and osteoporosis. Eating a well-balanced diet with plenty of calcium and vitamin D will help to protect your bones. Weight-bearing and strength-building activities are also important for building and maintaining strong bones. Bone mass can be measured with an X-ray test called a bone mineral density (BMD) test. This information is not intended to replace advice given to you by your health care provider. Make sure you discuss any questions you have with your health care provider. Document Revised: 12/29/2017 Document Reviewed: 12/29/2017 Elsevier Patient Education  2022 [employer].

## 2021-10-18 LAB — ESTRADIOL: Estradiol: 92.1 pg/mL

## 2021-10-18 LAB — FOLLICLE STIMULATING HORMONE: FSH: 5.5 m[IU]/mL

## 2021-10-22 LAB — CYTOLOGY - PAP
Chlamydia: NEGATIVE
Comment: NEGATIVE
Comment: NEGATIVE
Comment: NEGATIVE
Comment: NORMAL
Diagnosis: NEGATIVE
High risk HPV: NEGATIVE
Neisseria Gonorrhea: NEGATIVE
Trichomonas: NEGATIVE

## 2021-11-30 ENCOUNTER — Other Ambulatory Visit: Payer: Self-pay

## 2021-11-30 ENCOUNTER — Ambulatory Visit
Admission: RE | Admit: 2021-11-30 | Discharge: 2021-11-30 | Disposition: A | Discharge status: Home / Self Care | Payer: BC Managed Care – PPO | Source: Ambulatory Visit | Attending: Obstetrics and Gynecology | Admitting: Obstetrics and Gynecology

## 2021-11-30 DIAGNOSIS — Z1231 Encounter for screening mammogram for malignant neoplasm of breast: Secondary | ICD-10-CM | POA: Insufficient documentation

## 2022-01-22 ENCOUNTER — Telehealth: Payer: Self-pay | Admitting: Obstetrics and Gynecology

## 2022-01-22 NOTE — Telephone Encounter (Signed)
Pt is scheduled with [facility] on 3/15 for Lyletta removal and reinsertion.

## 2022-01-23 NOTE — Telephone Encounter (Signed)
Noted. Will order to arrive by apt date/time. 

## 2022-02-27 ENCOUNTER — Other Ambulatory Visit: Payer: Self-pay

## 2022-02-27 ENCOUNTER — Ambulatory Visit: Payer: BC Managed Care – PPO | Admitting: Obstetrics and Gynecology

## 2022-02-27 ENCOUNTER — Encounter: Payer: Self-pay | Admitting: Obstetrics and Gynecology

## 2022-02-27 VITALS — BP 120/80 | Ht 64.0 in | Wt 167.0 lb

## 2022-02-27 DIAGNOSIS — Z113 Encounter for screening for infections with a predominantly sexual mode of transmission: Secondary | ICD-10-CM

## 2022-02-27 DIAGNOSIS — N771 Vaginitis, vulvitis and vulvovaginitis in diseases classified elsewhere: Secondary | ICD-10-CM | POA: Diagnosis not present

## 2022-02-27 DIAGNOSIS — R875 Abnormal microbiological findings in specimens from female genital organs: Secondary | ICD-10-CM

## 2022-02-27 DIAGNOSIS — Z30430 Encounter for insertion of intrauterine contraceptive device: Secondary | ICD-10-CM | POA: Diagnosis not present

## 2022-02-27 DIAGNOSIS — Z30432 Encounter for removal of intrauterine contraceptive device: Secondary | ICD-10-CM

## 2022-02-27 NOTE — Patient Instructions (Signed)
Intrauterine Device Insertion, Care After This sheet gives you information about how to care for yourself after your procedure. Your health care provider may also give you more specific instructions. If you have problems or questions, contact your health care provider. What can I expect after the procedure? After the procedure, it is common to have: Cramps and pain in the abdomen. Bleeding. It may be light or heavy. This may last for a few days. Lower back pain. Dizziness. Headaches. Nausea. Follow these instructions at home:  Before resuming sexual activity, check to make sure that you can feel the IUD string or strings. You should be able to feel the end of the string below the opening of your cervix. If your IUD string is in place, you may resume sexual activity. If you had a hormonal IUD inserted more than 7 days after your most recent period started, you will need to use a backup method of birth control for 7 days after IUD insertion. Ask your health care provider whether this applies to you. Continue to check that the IUD is still in place by feeling for the strings after every menstrual period, or once a month. An IUD will not protect you from sexually transmitted infections (STIs). Use methods to prevent the exchange of body fluids between partners (barrier protection) every time you have sex. Barrier protection can be used during oral, vaginal, or anal sex. Commonly used barrier methods include: Female condom. Female condom. Dental dam. Take over-the-counter and prescription medicines only as told by your health care provider. Keep all follow-up visits as told by your health care provider. This is important. Contact a health care provider if: You feel light-headed or weak. You have any of the following problems with your IUD string or strings: The string bothers or hurts you or your sexual partner. You cannot feel the string. The string has gotten longer. You can feel the IUD in  your vagina. You think you may be pregnant, or you miss your menstrual period. You think you may have a sexually transmitted infection (STI). Get help right away if: You have flu-like symptoms, such as tiredness (fatigue) and muscle aches. You have a fever and chills. You have bleeding that is heavier or lasts longer than a normal menstrual cycle. You have abnormal or bad-smelling discharge from your vagina. You develop abdominal pain that is new, is getting worse, or is not in the same area of earlier cramping and pain. You have pain during sexual activity. Summary After the procedure, it is common to have cramps and pain in the abdomen. It is also common to have light bleeding or heavier bleeding that is like your menstrual period. Continue to check that the IUD is still in place by feeling for the strings after every menstrual period, or once a month. Keep all follow-up visits as told by your health care provider. This is important. Contact your health care provider if you have problems with your IUD strings, such as the string getting longer or bothering you or your sexual partner. This information is not intended to replace advice given to you by your health care provider. Make sure you discuss any questions you have with your health care provider. Document Revised: 11/23/2019 Document Reviewed: 11/23/2019 Elsevier Patient Education  2022 Elsevier Inc.  

## 2022-02-27 NOTE — Progress Notes (Signed)
? ?  Patient ID: Zoe [name], female   DOB: Sep 16, 1966, 56 y.o.   MRN: [id number] ? ?Reason for Consult: Contraception ?  ?Referred by Danelle Berry, NP ? ?IUD placed approximately 7 years ago. Since that time, she states that she has been happy with the IUD. She wishes to have her IUD removed and replaced with a new  IUD.  ? ?Discussed risks of irregular bleeding, cramping, infection, malpositioning or misplacement of the IUD outside the uterus which may require further procedure such as laparoscopy, risk of failure <1%. Time out was performed.  ? ?Patient identified, informed consent performed, consent signed.    ? ?A bimanual exam showed the uterus to be anteverted.  Speculum placed in the vagina.  Cervix visualized.  Patient had thick yellow cervical discharge. NUSWAB collected ? ?IUD Removal ?Strings of IUD identified and grasped.  IUD removed without problem.  Pt tolerated this well.  IUD noted to be intact. ? ?IUD Placement ?Cleaned with Betadine x 2.   Grasped anteriorly with a single tooth tenaculum.  Uterus sounded to 9 cm.   Liletta IUD placed per manufacturer's recommendations.  Strings trimmed to 3 cm. Tenaculum was removed, good hemostasis noted.  Patient tolerated procedure well.  ? ?Patient was given post-procedure instructions. Patient was also asked to check IUD strings periodically and follow up in 4 weeks for IUD check. ? ?Nuswab sent to check for infection ? ?Adrian Prows MD, FACOG ?[facility] ?02/27/2022 ?2:49 PM ? ?

## 2022-03-05 LAB — NUSWAB VAGINITIS PLUS (VG+)
Candida albicans, NAA: NEGATIVE
Candida glabrata, NAA: POSITIVE — AB
Chlamydia trachomatis, NAA: NEGATIVE
Neisseria gonorrhoeae, NAA: NEGATIVE
Trich vag by NAA: NEGATIVE

## 2022-03-07 ENCOUNTER — Other Ambulatory Visit: Payer: Self-pay | Admitting: Obstetrics and Gynecology

## 2022-03-07 NOTE — Progress Notes (Signed)
Called in boric acid rx to [facility] ? ?Adrian Prows MD, FACOG ?[facility] ?03/07/2022 ?9:52 AM ? ?

## 2022-03-22 ENCOUNTER — Encounter: Payer: Self-pay | Admitting: Obstetrics and Gynecology

## 2022-03-27 ENCOUNTER — Ambulatory Visit: Payer: BC Managed Care – PPO | Admitting: Obstetrics and Gynecology

## 2022-03-27 ENCOUNTER — Other Ambulatory Visit (HOSPITAL_COMMUNITY)
Admission: RE | Admit: 2022-03-27 | Discharge: 2022-03-27 | Disposition: A | Discharge status: Home / Self Care | Payer: BC Managed Care – PPO | Source: Ambulatory Visit | Attending: Obstetrics and Gynecology | Admitting: Obstetrics and Gynecology

## 2022-03-27 ENCOUNTER — Encounter: Payer: Self-pay | Admitting: Obstetrics and Gynecology

## 2022-03-27 VITALS — BP 120/60 | Ht 64.0 in | Wt 164.0 lb

## 2022-03-27 DIAGNOSIS — R319 Hematuria, unspecified: Secondary | ICD-10-CM

## 2022-03-27 DIAGNOSIS — Z30431 Encounter for routine checking of intrauterine contraceptive device: Secondary | ICD-10-CM

## 2022-03-27 DIAGNOSIS — Z8742 Personal history of other diseases of the female genital tract: Secondary | ICD-10-CM | POA: Insufficient documentation

## 2022-03-27 NOTE — Progress Notes (Signed)
56 yo here for IUD check. Patient had IUD placed on 02/27/22. Patient reports doing well and denies any complaints. She completed boric acid treatment for a vaginal yeast infection recently. Patient also reports blood on tissue following urination. She denies dysuria, frequency or urgency ? ?Past Medical History:  ?Diagnosis Date  ? Allergic rhinitis   ? Asthma   ? Coronary artery disease   ? Diabetes mellitus without complication ([place])   ? Dysplastic nevi 08/30/2020  ? Right lateral distal deltoid, left lower back post waistline. Moderate atypia, close to margin  ? GERD (gastroesophageal reflux disease)   ? History of mammogram 07/18/10; 08/25/15  ? neg; neg  ? History of Papanicolaou smear of cervix 07/2013; 08/25/15  ? -/-; neg  ? Hypercholesteremia   ? Hypertension   ? IBS (irritable bowel syndrome)   ? Myocardial infarction ([place]) 02/2019  ? ?Past Surgical History:  ?Procedure Laterality Date  ? CARDIAC CATHETERIZATION    ? COLONOSCOPY  2001  ? COLONOSCOPY WITH PROPOFOL N/A 11/05/2019  ? Procedure: COLONOSCOPY WITH PROPOFOL;  Surgeon: Virgel Manifold, MD;  Location: [facility];  Service: Endoscopy;  Laterality: N/A;  ? CORONARY ANGIOPLASTY    ? DILATATION & CURETTAGE/HYSTEROSCOPY WITH MYOSURE N/A 02/29/2016  ? Procedure: DILATATION & CURETTAGE/HYSTEROSCOPY WITH MYOSURE;  Surgeon: Honor Loh Ward, MD;  Location: [facility];  Service: Gynecology;  Laterality: N/A;  ? DILATION AND CURETTAGE OF UTERUS    ? EAR CYST EXCISION Left 2015  ? IN OFFICE  ? ESOPHAGOGASTRODUODENOSCOPY    ? INTRAUTERINE DEVICE (IUD) INSERTION N/A 02/29/2016  ? Procedure: INTRAUTERINE DEVICE (IUD) INSERTION;  Surgeon: Honor Loh Ward, MD;  Location: [facility];  Service: Gynecology;  Laterality: N/A;  ? LEFT HEART CATH AND CORONARY ANGIOGRAPHY Left 03/16/2019  ? Procedure: LEFT HEART CATH AND CORONARY ANGIOGRAPHY;  Surgeon: Dionisio David, MD;  Location: [facility];  Service: Cardiovascular;  Laterality: Left;  ? ?Family History  ?Problem  Relation Age of Onset  ? Hyperlipidemia Mother   ? Hypertension Mother   ? Heart disease Father   ?     died from either MI or stroke  ? Diabetes Brother   ?     TYPE 2  ? Heart disease Paternal Grandfather   ? Diabetes Other   ?     TYPE 2  ? Cancer Neg Hx   ? Breast cancer Neg Hx   ? ?Social History  ? ?Tobacco Use  ? Smoking status: Never  ? Smokeless tobacco: Never  ?Vaping Use  ? Vaping Use: Never used  ?Substance Use Topics  ? Alcohol use: Yes  ?  Alcohol/week: 3.0 - 5.0 standard drinks  ?  Types: 3 - 5 Cans of beer per week  ?  Comment: occasionally  ? Drug use: No  ? ?ROS ?See pertinent in HPI. All other systems reviewed and non contributory ?Blood pressure 120/60, height '5\' 4"'$  (1.626 m), weight 164 lb (74.4 kg). ?GENERAL: Well-developed, well-nourished female in no acute distress.  ?ABDOMEN: Soft, nontender, nondistended. No organomegaly. ?PELVIC: Normal external female genitalia. Vagina is pink and rugated.  Normal discharge. Normal appearing cervix with IUD strings visualized. Chaperone present during the pelvic exam ?EXTREMITIES: No cyanosis, clubbing, or edema, 2+ distal pulses. ? ?A/P 56 yo her for IUD check and desires re-testing for yeast infection following treatment ?- IUD appears to be in the appropriate location ?- vaginal swab collected ?- Urine culture ordered ?- RTC prn ?

## 2022-03-29 LAB — CERVICOVAGINAL ANCILLARY ONLY
Candida Glabrata: NEGATIVE
Candida Vaginitis: NEGATIVE
Comment: NEGATIVE
Comment: NEGATIVE

## 2022-04-01 LAB — URINE CULTURE

## 2022-04-08 NOTE — Telephone Encounter (Signed)
Liletta rcvd/charged 02/27/22 ?

## 2022-11-18 ENCOUNTER — Other Ambulatory Visit: Payer: Self-pay | Admitting: Obstetrics and Gynecology

## 2022-11-18 DIAGNOSIS — Z1231 Encounter for screening mammogram for malignant neoplasm of breast: Secondary | ICD-10-CM

## 2023-01-01 ENCOUNTER — Ambulatory Visit: Payer: BC Managed Care – PPO | Admitting: Obstetrics and Gynecology

## 2023-01-08 ENCOUNTER — Ambulatory Visit: Payer: BC Managed Care – PPO | Admitting: Obstetrics and Gynecology

## 2023-01-15 ENCOUNTER — Encounter: Payer: Self-pay | Admitting: Obstetrics and Gynecology

## 2023-01-15 ENCOUNTER — Ambulatory Visit
Admission: RE | Admit: 2023-01-15 | Discharge: 2023-01-15 | Disposition: A | Discharge status: Home / Self Care | Payer: BC Managed Care – PPO | Source: Ambulatory Visit | Attending: Obstetrics and Gynecology | Admitting: Obstetrics and Gynecology

## 2023-01-15 ENCOUNTER — Ambulatory Visit (INDEPENDENT_AMBULATORY_CARE_PROVIDER_SITE_OTHER): Payer: BC Managed Care – PPO | Admitting: Obstetrics and Gynecology

## 2023-01-15 VITALS — BP 115/81 | HR 66 | Resp 16 | Ht 64.0 in | Wt 167.2 lb

## 2023-01-15 DIAGNOSIS — Z01419 Encounter for gynecological examination (general) (routine) without abnormal findings: Secondary | ICD-10-CM | POA: Diagnosis not present

## 2023-01-15 DIAGNOSIS — Z975 Presence of (intrauterine) contraceptive device: Secondary | ICD-10-CM

## 2023-01-15 DIAGNOSIS — N393 Stress incontinence (female) (male): Secondary | ICD-10-CM

## 2023-01-15 DIAGNOSIS — Z1231 Encounter for screening mammogram for malignant neoplasm of breast: Secondary | ICD-10-CM

## 2023-01-15 DIAGNOSIS — E119 Type 2 diabetes mellitus without complications: Secondary | ICD-10-CM

## 2023-01-15 DIAGNOSIS — I10 Essential (primary) hypertension: Secondary | ICD-10-CM

## 2023-01-15 NOTE — Progress Notes (Signed)
GYNECOLOGY ANNUAL PHYSICAL EXAM PROGRESS NOTE  Subjective:    Zoe [name] is a 57 y.o. G64P2002 female who presents for an annual exam. The patient is sexually active. The patient participates in regular exercise: no. Has the patient ever been transfused or tattooed?: no. The patient reports that there is not domestic violence in her life.   The patient has the following complaints today:  Reports issues with leaking urine with coughing, sneezing, laughing.  Patient notes that this has been going on for several years, progressively worsening. Denies urge.   Menstrual History: Menarche age: 66 No LMP recorded. (Menstrual status: IUD).     Gynecologic History:  Contraception: Kyleena IUD, inserted 02/27/2022.  History of STI's: Denies Last Pap: 10/17/2021. Results were: normal.  Denies h/o abnormal pap smears. Last mammogram: 01/15/2023. Results were: pending Last colonoscopy: 11/05/2019.  Results were: Normal.  Repeat in 10 years.   OB History  Gravida Para Term Preterm AB Living  '2 2 2 '$ 0 0 2  SAB IAB Ectopic Multiple Live Births  0 0 0 0 2    # Outcome Date GA Lbr Len/2nd Weight Sex Delivery Anes PTL Lv  2 Term 01/14/00   8 lb (3.629 kg) M Vag-Spont   LIV     Name: Glennon Mac  1 Term 02/03/98   7 lb (3.175 kg) F Vag-Spont   LIV     Name: Gareth Eagle    Past Medical History:  Diagnosis Date   Allergic rhinitis    Asthma    Coronary artery disease    Diabetes mellitus without complication ([place])    Dysplastic nevi 08/30/2020   Right lateral distal deltoid, left lower back post waistline. Moderate atypia, close to margin   GERD (gastroesophageal reflux disease)    History of mammogram 07/18/10; 08/25/15   neg; neg   History of Papanicolaou smear of cervix 07/2013; 08/25/15   -/-; neg   Hypercholesteremia    Hypertension    IBS (irritable bowel syndrome)    Myocardial infarction ([place]) 02/2019    Past Surgical History:  Procedure Laterality Date   CARDIAC CATHETERIZATION      COLONOSCOPY  2001   COLONOSCOPY WITH PROPOFOL N/A 11/05/2019   Procedure: COLONOSCOPY WITH PROPOFOL;  Surgeon: Virgel Manifold, MD;  Location: [facility];  Service: Endoscopy;  Laterality: N/A;   CORONARY ANGIOPLASTY     DILATATION & CURETTAGE/HYSTEROSCOPY WITH MYOSURE N/A 02/29/2016   Procedure: DILATATION & CURETTAGE/HYSTEROSCOPY WITH MYOSURE;  Surgeon: Honor Loh Ward, MD;  Location: [facility];  Service: Gynecology;  Laterality: N/A;   DILATION AND CURETTAGE OF UTERUS     EAR CYST EXCISION Left 2015   IN OFFICE   ESOPHAGOGASTRODUODENOSCOPY     INTRAUTERINE DEVICE (IUD) INSERTION N/A 02/29/2016   Procedure: INTRAUTERINE DEVICE (IUD) INSERTION;  Surgeon: Honor Loh Ward, MD;  Location: [facility];  Service: Gynecology;  Laterality: N/A;   LEFT HEART CATH AND CORONARY ANGIOGRAPHY Left 03/16/2019   Procedure: LEFT HEART CATH AND CORONARY ANGIOGRAPHY;  Surgeon: Dionisio David, MD;  Location: [facility];  Service: Cardiovascular;  Laterality: Left;    Family History  Problem Relation Age of Onset   Hyperlipidemia Mother    Hypertension Mother    Heart disease Father        died from either MI or stroke   Diabetes Brother        TYPE 2   Heart disease Paternal Grandfather    Diabetes Other  TYPE 2   Cancer Neg Hx    Breast cancer Neg Hx     Social History   Socioeconomic History   Marital status: Married    Spouse name: Not on file   Number of children: 2   Years of education: 18   Highest education level: Not on file  Occupational History   Occupation: Teacher    Comment: [employer] - 2[employer] GR[employer]  Tobacco Use   Smoking status: Never   Smokeless tobacco: Never  Vaping Use   Vaping Use: Never used  Substance and Sexual Activity   Alcohol use: Yes    Alcohol/week: 3.0 - 5.0 standard drinks of alcohol    Types: 3 - 5 Cans of beer per week    Comment: occasionally   Drug use: No   Sexual activity: Yes    Partners: Male    Birth control/protection: I.U.D.   Other Topics Concern   Not on file  Social History Narrative   Not on file   Social Determinants of Health   Financial Resource Strain: Not on file  Food Insecurity: Not on file  Transportation Needs: Not on file  Physical Activity: Not on file  Stress: Not on file  Social Connections: Not on file  Intimate Partner Violence: Not on file    Current Outpatient Medications on File Prior to Visit  Medication Sig Dispense Refill   aspirin 81 MG tablet Take 81 mg by mouth daily.     Cholecalciferol (VITAMIN D-3 PO) Take 1,000 Units by mouth daily.     clopidogrel (PLAVIX) 75 MG tablet Take 1 tablet (75 mg total) by mouth daily. 30 tablet 0   Coenzyme Q10 (CO Q 10 PO) Take 1 capsule by mouth daily.     FARXIGA 10 MG TABS tablet Take 10 mg by mouth every morning.     ibuprofen (ADVIL,MOTRIN) 600 MG tablet Take 1 tablet (600 mg total) by mouth every 6 (six) hours as needed. 30 tablet 0   isosorbide mononitrate (IMDUR) 30 MG 24 hr tablet Take 0.5 tablets (15 mg total) by mouth daily. 30 tablet 0   losartan (COZAAR) 25 MG tablet Take 25 mg by mouth daily.     losartan-hydrochlorothiazide (HYZAAR) 50-12.5 MG tablet Take 1 tablet by mouth daily.     metoprolol succinate (TOPROL-XL) 50 MG 24 hr tablet Take 50 mg by mouth daily.     Multiple Vitamin (MULTIVITAMIN) tablet Take 1 tablet by mouth daily.      Omega-3 Fatty Acids (FISH OIL) 1200 MG CAPS Take 1 capsule by mouth daily.     rosuvastatin (CRESTOR) 40 MG tablet Take 40 mg by mouth daily.      No current facility-administered medications on file prior to visit.    Allergies  Allergen Reactions   Ace Inhibitors Cough    Patient cannot recall the specific ACE inhibitor that caused her cough.   Neosporin Wound Cleanser [Benzalkonium Chloride] Rash and Other (See Comments)    Swelling / redness     Review of Systems Constitutional: negative for chills, fatigue, fevers and sweats Eyes: negative for irritation, redness and visual  disturbance Ears, nose, mouth, throat, and face: negative for hearing loss, nasal congestion, snoring and tinnitus Respiratory: negative for asthma, cough, sputum Cardiovascular: negative for chest pain, dyspnea, exertional chest pressure/discomfort, irregular heart beat, palpitations and syncope Gastrointestinal: negative for abdominal pain, change in bowel habits, nausea and vomiting Genitourinary: negative for abnormal menstrual periods, genital lesions, sexual problems and vaginal discharge,  dysuria and urinary incontinence Integument/breast: negative for breast lump, breast tenderness and nipple discharge Hematologic/lymphatic: negative for bleeding and easy bruising Musculoskeletal:negative for back pain and muscle weakness Neurological: negative for dizziness, headaches, vertigo and weakness Endocrine: negative for diabetic symptoms including polydipsia, polyuria and skin dryness Allergic/Immunologic: negative for hay fever and urticaria      Objective:  Blood pressure 115/81, pulse 66, resp. rate 16, height '5\' 4"'$  (1.626 m), weight 167 lb 3.2 oz (75.8 kg). Body mass index is 28.7 kg/m.  General Appearance:    Alert, cooperative, no distress, appears stated age  Head:    Normocephalic, without obvious abnormality, atraumatic  Eyes:    PERRL, conjunctiva/corneas clear, EOM's intact, both eyes  Ears:    Normal external ear canals, both ears  Nose:   Nares normal, septum midline, mucosa normal, no drainage or sinus tenderness  Throat:   Lips, mucosa, and tongue normal; teeth and gums normal  Neck:   Supple, symmetrical, trachea midline, no adenopathy; thyroid: no enlargement/tenderness/nodules; no carotid bruit or JVD  Back:     Symmetric, no curvature, ROM normal, no CVA tenderness  Lungs:     Clear to auscultation bilaterally, respirations unlabored  Chest Wall:    No tenderness or deformity   Heart:    Regular rate and rhythm, S1 and S2 normal, no murmur, rub or gallop  Breast  Exam:    No tenderness, masses, or nipple abnormality  Abdomen:     Soft, non-tender, bowel sounds active all four quadrants, no masses, no organomegaly.    Genitalia:    Pelvic:external genitalia normal, vagina without lesions, discharge, or tenderness, rectovaginal septum  normal. Cervix normal in appearance, no cervical motion tenderness, IUD threads visualized, 3 cm in length. No adnexal masses or tenderness.  Uterus normal size, shape, mobile, regular contours, nontender.  Rectal:    Normal external sphincter.  No hemorrhoids appreciated. Internal exam not done.   Extremities:   Extremities normal, atraumatic, no cyanosis or edema  Pulses:   2+ and symmetric all extremities  Skin:   Skin color, texture, turgor normal, no rashes or lesions  Lymph nodes:   Cervical, supraclavicular, and axillary nodes normal  Neurologic:   CNII-XII intact, normal strength, sensation and reflexes throughout    Labs:  Performed by PCP at [facility].    Assessment:   1. Encounter for well woman exam with routine gynecological exam   2. Stress incontinence, female   3. IUD (intrauterine device) in place   4. Primary hypertension   5. Controlled type 2 diabetes mellitus without complication, without long-term current use of insulin ([place])      Plan:  - Blood tests: Done by PCP. - Breast self exam technique reviewed and patient encouraged to perform self-exam monthly. - Contraception: IUD. Can keep in place for total of 5 years.  - Discussed healthy lifestyle modifications. - Mammogram  UTD - Pap smear  UTD . - COVID vaccination status: eligible for booster.  - Flu vaccine: Declines this year, however notes she does typically receive them.  - Stress incontinence, briefly discussed management options of pelvic floor PT, pessary, or surgery with urinary sling.  Patient desires to try pessary. Will return in 2-3 weeks for fitting.  - HTN and DM managed by PCP.  - Follow up in 1 year for annual  exam   Rubie Maid, MD [place]

## 2023-01-15 NOTE — Patient Instructions (Signed)

## 2023-02-15 ENCOUNTER — Other Ambulatory Visit: Payer: Self-pay | Admitting: Cardiovascular Disease

## 2023-02-15 DIAGNOSIS — I10 Essential (primary) hypertension: Secondary | ICD-10-CM

## 2023-03-19 ENCOUNTER — Other Ambulatory Visit: Payer: Self-pay | Admitting: Cardiovascular Disease

## 2023-03-19 DIAGNOSIS — R002 Palpitations: Secondary | ICD-10-CM

## 2023-03-19 DIAGNOSIS — R079 Chest pain, unspecified: Secondary | ICD-10-CM

## 2023-04-03 ENCOUNTER — Encounter: Payer: Self-pay | Admitting: Cardiovascular Disease

## 2023-04-03 ENCOUNTER — Ambulatory Visit: Payer: BC Managed Care – PPO | Admitting: Cardiovascular Disease

## 2023-04-03 VITALS — BP 110/70 | HR 67 | Ht 64.0 in | Wt 165.6 lb

## 2023-04-03 DIAGNOSIS — I2089 Other forms of angina pectoris: Secondary | ICD-10-CM

## 2023-04-03 DIAGNOSIS — E7800 Pure hypercholesterolemia, unspecified: Secondary | ICD-10-CM

## 2023-04-03 DIAGNOSIS — I10 Essential (primary) hypertension: Secondary | ICD-10-CM | POA: Diagnosis not present

## 2023-04-03 DIAGNOSIS — I2510 Atherosclerotic heart disease of native coronary artery without angina pectoris: Secondary | ICD-10-CM | POA: Diagnosis not present

## 2023-04-03 MED ORDER — PANTOPRAZOLE SODIUM 40 MG PO TBEC
40.0000 mg | DELAYED_RELEASE_TABLET | Freq: Every day | ORAL | 1 refills | Status: DC
Start: 2023-04-03 — End: 2023-04-28

## 2023-04-03 NOTE — Progress Notes (Signed)
Cardiology Office Note   Date:  04/03/2023   ID:  Zoe [name], DOB Feb 24, 1966, MRN [id number]  PCP:  Zoe Eva, NP  Cardiologist:  Zoe Blackwater, MD      History of Present Illness: Zoe [name] is a 57 y.o. female who presents for  Chief Complaint  Patient presents with   Follow-up    4 month follow up    HPI    Past Medical History:  Diagnosis Date   Allergic rhinitis    Asthma    Coronary artery disease    Diabetes mellitus without complication    Dysplastic nevi 08/30/2020   Right lateral distal deltoid, left lower back post waistline. Moderate atypia, close to margin   GERD (gastroesophageal reflux disease)    History of mammogram 07/18/10; 08/25/15   neg; neg   History of Papanicolaou smear of cervix 07/2013; 08/25/15   -/-; neg   Hypercholesteremia    Hypertension    IBS (irritable bowel syndrome)    Myocardial infarction 02/2019     Past Surgical History:  Procedure Laterality Date   CARDIAC CATHETERIZATION     COLONOSCOPY  2001   COLONOSCOPY WITH PROPOFOL N/A 11/05/2019   Procedure: COLONOSCOPY WITH PROPOFOL;  Surgeon: Pasty Spillers, MD;  Location: [facility];  Service: Endoscopy;  Laterality: N/A;   CORONARY ANGIOPLASTY     DILATATION & CURETTAGE/HYSTEROSCOPY WITH MYOSURE N/A 02/29/2016   Procedure: DILATATION & CURETTAGE/HYSTEROSCOPY WITH MYOSURE;  Surgeon: Elenora Fender Ward, MD;  Location: [facility];  Service: Gynecology;  Laterality: N/A;   DILATION AND CURETTAGE OF UTERUS     EAR CYST EXCISION Left 2015   IN OFFICE   ESOPHAGOGASTRODUODENOSCOPY     INTRAUTERINE DEVICE (IUD) INSERTION N/A 02/29/2016   Procedure: INTRAUTERINE DEVICE (IUD) INSERTION;  Surgeon: Elenora Fender Ward, MD;  Location: [facility];  Service: Gynecology;  Laterality: N/A;   LEFT HEART CATH AND CORONARY ANGIOGRAPHY Left 03/16/2019   Procedure: LEFT HEART CATH AND CORONARY ANGIOGRAPHY;  Surgeon: Laurier Nancy, MD;  Location: [facility];  Service:  Cardiovascular;  Laterality: Left;     Current Outpatient Medications  Medication Sig Dispense Refill   aspirin 81 MG tablet Take 81 mg by mouth daily.     Cholecalciferol (VITAMIN D-3 PO) Take 1,000 Units by mouth daily.     clopidogrel (PLAVIX) 75 MG tablet Take 1 tablet (75 mg total) by mouth daily. 30 tablet 0   Coenzyme Q10 (CO Q 10 PO) Take 1 capsule by mouth daily.     FARXIGA 10 MG TABS tablet Take 10 mg by mouth every morning.     ibuprofen (ADVIL,MOTRIN) 600 MG tablet Take 1 tablet (600 mg total) by mouth every 6 (six) hours as needed. 30 tablet 0   isosorbide mononitrate (IMDUR) 30 MG 24 hr tablet TAKE 1 TABLET BY MOUTH EVERY DAY 90 tablet 2   losartan-hydrochlorothiazide (HYZAAR) 50-12.5 MG tablet TAKE 1 TABLET BY MOUTH EVERY DAY 90 tablet 0   metoprolol succinate (TOPROL-XL) 50 MG 24 hr tablet TAKE 1 TABLET BY MOUTH EVERY DAY 90 tablet 2   Multiple Vitamin (MULTIVITAMIN) tablet Take 1 tablet by mouth daily.      Omega-3 Fatty Acids (FISH OIL) 1200 MG CAPS Take 1 capsule by mouth daily.     pantoprazole (PROTONIX) 40 MG tablet Take 1 tablet (40 mg total) by mouth daily. 30 tablet 1   rosuvastatin (CRESTOR) 40 MG tablet Take 40 mg by mouth daily.  No current facility-administered medications for this visit.    Allergies:   Ace inhibitors and Neosporin wound cleanser [benzalkonium chloride]    Social History:   reports that she has never smoked. She has never used smokeless tobacco. She reports current alcohol use of about 3.0 - 5.0 standard drinks of alcohol per week. She reports that she does not use drugs.   Family History:  family history includes Diabetes in her brother and another family member; Heart disease in her father and paternal grandfather; Hyperlipidemia in her mother; Hypertension in her mother.    ROS:     Review of Systems  Constitutional: Negative.   HENT: Negative.    Eyes: Negative.   Respiratory: Negative.    Gastrointestinal: Negative.    Genitourinary: Negative.   Musculoskeletal: Negative.   Skin: Negative.   Neurological: Negative.   Endo/Heme/Allergies: Negative.   Psychiatric/Behavioral: Negative.    All other systems reviewed and are negative.     All other systems are reviewed and negative.    PHYSICAL EXAM: VS:  BP 110/70   Pulse 67   Ht 5\' 4"  (1.626 m)   Wt 165 lb 9.6 oz (75.1 kg)   SpO2 95%   BMI 28.43 kg/m  , BMI Body mass index is 28.43 kg/m. Last weight:  Wt Readings from Last 3 Encounters:  04/03/23 165 lb 9.6 oz (75.1 kg)  01/15/23 167 lb 3.2 oz (75.8 kg)  03/27/22 164 lb (74.4 kg)     Physical Exam Constitutional:      Appearance: Normal appearance.  Cardiovascular:     Rate and Rhythm: Normal rate and regular rhythm.     Heart sounds: Normal heart sounds.  Pulmonary:     Effort: Pulmonary effort is normal.     Breath sounds: Normal breath sounds.  Musculoskeletal:     Right lower leg: No edema.     Left lower leg: No edema.  Neurological:     Mental Status: She is alert.       EKG:   Recent Labs: No results found for requested labs within last 365 days.    Lipid Panel No results found for: "CHOL", "TRIG", "HDL", "CHOLHDL", "VLDL", "LDLCALC", "LDLDIRECT"    Other studies Reviewed: Additional studies/ records that were reviewed today include:  Review of the above records demonstrates:       No data to display            ASSESSMENT AND PLAN:    ICD-10-CM   1. Stable angina  I20.89 pantoprazole (PROTONIX) 40 MG tablet   no chest pain    2. Coronary artery disease involving native coronary artery of native heart without angina pectoris  I25.10 pantoprazole (PROTONIX) 40 MG tablet   stable    3. Primary hypertension  I10 pantoprazole (PROTONIX) 40 MG tablet    4. Pure hypercholesterolemia  E78.00 pantoprazole (PROTONIX) 40 MG tablet       Problem List Items Addressed This Visit       Cardiovascular and Mediastinum   Hypertension   Relevant  Medications   pantoprazole (PROTONIX) 40 MG tablet   Coronary artery disease involving native coronary artery of native heart without angina pectoris   Relevant Medications   pantoprazole (PROTONIX) 40 MG tablet     Other   Pure hypercholesterolemia   Relevant Medications   pantoprazole (PROTONIX) 40 MG tablet   Other Visit Diagnoses     Stable angina    -  Primary   no chest  pain   Relevant Medications   pantoprazole (PROTONIX) 40 MG tablet          Disposition:   Return in about 3 months (around 07/03/2023).    Total time spent: 30 minutes  Signed,  Zoe Blackwater, MD  04/03/2023 3:36 PM    [facility]

## 2023-04-27 ENCOUNTER — Other Ambulatory Visit: Payer: Self-pay | Admitting: Cardiovascular Disease

## 2023-04-27 DIAGNOSIS — I2089 Other forms of angina pectoris: Secondary | ICD-10-CM

## 2023-04-27 DIAGNOSIS — I2510 Atherosclerotic heart disease of native coronary artery without angina pectoris: Secondary | ICD-10-CM

## 2023-04-27 DIAGNOSIS — E7800 Pure hypercholesterolemia, unspecified: Secondary | ICD-10-CM

## 2023-04-27 DIAGNOSIS — I10 Essential (primary) hypertension: Secondary | ICD-10-CM

## 2023-05-14 ENCOUNTER — Other Ambulatory Visit: Payer: Self-pay | Admitting: Nurse Practitioner

## 2023-05-14 ENCOUNTER — Other Ambulatory Visit: Payer: BC Managed Care – PPO

## 2023-05-15 LAB — COMPREHENSIVE METABOLIC PANEL
ALT: 27 IU/L (ref 0–32)
AST: 21 IU/L (ref 0–40)
Albumin/Globulin Ratio: 2.5 — ABNORMAL HIGH (ref 1.2–2.2)
Albumin: 4.9 g/dL (ref 3.8–4.9)
Alkaline Phosphatase: 63 IU/L (ref 44–121)
BUN/Creatinine Ratio: 24 — ABNORMAL HIGH (ref 9–23)
BUN: 15 mg/dL (ref 6–24)
Bilirubin Total: 0.8 mg/dL (ref 0.0–1.2)
CO2: 24 mmol/L (ref 20–29)
Calcium: 9.7 mg/dL (ref 8.7–10.2)
Chloride: 104 mmol/L (ref 96–106)
Creatinine, Ser: 0.63 mg/dL (ref 0.57–1.00)
Globulin, Total: 2 g/dL (ref 1.5–4.5)
Glucose: 127 mg/dL — ABNORMAL HIGH (ref 70–99)
Potassium: 4.5 mmol/L (ref 3.5–5.2)
Sodium: 142 mmol/L (ref 134–144)
Total Protein: 6.9 g/dL (ref 6.0–8.5)
eGFR: 105 mL/min/{1.73_m2} (ref >59)

## 2023-05-15 LAB — LIPID PANEL W/O CHOL/HDL RATIO
Cholesterol, Total: 132 mg/dL (ref 100–199)
HDL: 41 mg/dL (ref >39)
LDL Chol Calc (NIH): 58 mg/dL (ref 0–99)
Triglycerides: 199 mg/dL — ABNORMAL HIGH (ref 0–149)
VLDL Cholesterol Cal: 33 mg/dL (ref 5–40)

## 2023-05-15 LAB — TSH: TSH: 1.37 u[IU]/mL (ref 0.450–4.500)

## 2023-05-15 LAB — HGB A1C W/O EAG: Hgb A1c MFr Bld: 7.8 % — ABNORMAL HIGH (ref 4.8–5.6)

## 2023-05-16 ENCOUNTER — Other Ambulatory Visit: Payer: Self-pay | Admitting: Cardiovascular Disease

## 2023-05-16 DIAGNOSIS — I10 Essential (primary) hypertension: Secondary | ICD-10-CM

## 2023-05-16 DIAGNOSIS — I2510 Atherosclerotic heart disease of native coronary artery without angina pectoris: Secondary | ICD-10-CM

## 2023-05-19 ENCOUNTER — Ambulatory Visit: Payer: BC Managed Care – PPO | Admitting: Nurse Practitioner

## 2023-05-23 ENCOUNTER — Ambulatory Visit: Payer: BC Managed Care – PPO | Admitting: Nurse Practitioner

## 2023-05-24 ENCOUNTER — Other Ambulatory Visit: Payer: Self-pay | Admitting: Nurse Practitioner

## 2023-05-29 ENCOUNTER — Ambulatory Visit: Payer: BC Managed Care – PPO | Admitting: Nurse Practitioner

## 2023-05-29 ENCOUNTER — Encounter: Payer: Self-pay | Admitting: Internal Medicine

## 2023-05-29 ENCOUNTER — Ambulatory Visit (INDEPENDENT_AMBULATORY_CARE_PROVIDER_SITE_OTHER): Payer: BC Managed Care – PPO | Admitting: Internal Medicine

## 2023-05-29 VITALS — BP 130/86 | HR 72 | Ht 64.0 in | Wt 168.0 lb

## 2023-05-29 DIAGNOSIS — I2510 Atherosclerotic heart disease of native coronary artery without angina pectoris: Secondary | ICD-10-CM

## 2023-05-29 DIAGNOSIS — K219 Gastro-esophageal reflux disease without esophagitis: Secondary | ICD-10-CM | POA: Diagnosis not present

## 2023-05-29 DIAGNOSIS — E1165 Type 2 diabetes mellitus with hyperglycemia: Secondary | ICD-10-CM | POA: Diagnosis not present

## 2023-05-29 DIAGNOSIS — I10 Essential (primary) hypertension: Secondary | ICD-10-CM

## 2023-05-29 DIAGNOSIS — E782 Mixed hyperlipidemia: Secondary | ICD-10-CM | POA: Insufficient documentation

## 2023-05-29 DIAGNOSIS — E7800 Pure hypercholesterolemia, unspecified: Secondary | ICD-10-CM

## 2023-05-29 DIAGNOSIS — J301 Allergic rhinitis due to pollen: Secondary | ICD-10-CM

## 2023-05-29 LAB — POC CREATINE & ALBUMIN,URINE
Creatinine, POC: 50 mg/dL
Microalbumin Ur, POC: 10 mg/L

## 2023-05-29 MED ORDER — METFORMIN HCL 500 MG PO TABS
500.0000 mg | ORAL_TABLET | Freq: Two times a day (BID) | ORAL | 4 refills | Status: DC
Start: 2023-05-29 — End: 2023-08-01

## 2023-05-29 NOTE — Progress Notes (Signed)
Established Patient Office Visit  Subjective:  Patient ID: Zoe [name], female    DOB: June 23, 1966  Age: 57 y.o. MRN: [id number]  Chief Complaint  Patient presents with   Follow-up    6 month follow up, discuss lab results.    Patient is here for her 75-month follow-up.  She recently had labs done and would like to discuss the results.  Her hemoglobin A1c is at 7.8 right now.  She is only taking [place] currently.  Previously she was on metformin and was able to tolerate it.  We add it back to her regimen and she is to continue her Marcelline Deist as well.  Although she is aware of a  diabetic diet but admits that she has not been careful with what she eats.  Patient agrees to start with strict diet control. She is not having any chest pain or shortness of breath.  And she is also going to start an exercise regimen as well.    No other concerns at this time.   Past Medical History:  Diagnosis Date   Allergic rhinitis    Asthma    Coronary artery disease    Diabetes mellitus without complication (HCC)    Dysplastic nevi 08/30/2020   Right lateral distal deltoid, left lower back post waistline. Moderate atypia, close to margin   GERD (gastroesophageal reflux disease)    History of mammogram 07/18/10; 08/25/15   neg; neg   History of Papanicolaou smear of cervix 07/2013; 08/25/15   -/-; neg   Hypercholesteremia    Hypertension    IBS (irritable bowel syndrome)    Myocardial infarction (HCC) 02/2019    Past Surgical History:  Procedure Laterality Date   CARDIAC CATHETERIZATION     COLONOSCOPY  2001   COLONOSCOPY WITH PROPOFOL N/A 11/05/2019   Procedure: COLONOSCOPY WITH PROPOFOL;  Surgeon: Pasty Spillers, MD;  Location: [facility];  Service: Endoscopy;  Laterality: N/A;   CORONARY ANGIOPLASTY     DILATATION & CURETTAGE/HYSTEROSCOPY WITH MYOSURE N/A 02/29/2016   Procedure: DILATATION & CURETTAGE/HYSTEROSCOPY WITH MYOSURE;  Surgeon: Elenora Fender Ward, MD;  Location: [facility];   Service: Gynecology;  Laterality: N/A;   DILATION AND CURETTAGE OF UTERUS     EAR CYST EXCISION Left 2015   IN OFFICE   ESOPHAGOGASTRODUODENOSCOPY     INTRAUTERINE DEVICE (IUD) INSERTION N/A 02/29/2016   Procedure: INTRAUTERINE DEVICE (IUD) INSERTION;  Surgeon: Elenora Fender Ward, MD;  Location: [facility];  Service: Gynecology;  Laterality: N/A;   LEFT HEART CATH AND CORONARY ANGIOGRAPHY Left 03/16/2019   Procedure: LEFT HEART CATH AND CORONARY ANGIOGRAPHY;  Surgeon: Laurier Nancy, MD;  Location: [facility];  Service: Cardiovascular;  Laterality: Left;    Social History   Socioeconomic History   Marital status: Married    Spouse name: Not on file   Number of children: 2   Years of education: 88   Highest education level: Not on file  Occupational History   Occupation: Teacher    Comment: [employer] - 2[employer] GR[employer]  Tobacco Use   Smoking status: Never   Smokeless tobacco: Never  Vaping Use   Vaping Use: Never used  Substance and Sexual Activity   Alcohol use: Yes    Alcohol/week: 3.0 - 5.0 standard drinks of alcohol    Types: 3 - 5 Cans of beer per week    Comment: occasionally   Drug use: No   Sexual activity: Yes    Partners: Male    Birth  control/protection: I.U.D.  Other Topics Concern   Not on file  Social History Narrative   Not on file   Social Determinants of Health   Financial Resource Strain: Not on file  Food Insecurity: Not on file  Transportation Needs: Not on file  Physical Activity: Not on file  Stress: Not on file  Social Connections: Not on file  Intimate Partner Violence: Not on file    Family History  Problem Relation Age of Onset   Hyperlipidemia Mother    Hypertension Mother    Heart disease Father        died from either MI or stroke   Diabetes Brother        TYPE 2   Heart disease Paternal Grandfather    Diabetes Other        TYPE 2   Cancer Neg Hx    Breast cancer Neg Hx     Allergies  Allergen Reactions   Ace Inhibitors Cough     Patient cannot recall the specific ACE inhibitor that caused her cough.   Neosporin Wound Cleanser [Benzalkonium Chloride] Rash and Other (See Comments)    Swelling / redness    Review of Systems  Constitutional:  Negative for chills, diaphoresis, fever, malaise/fatigue and weight loss.  HENT:  Negative for congestion, ear discharge, ear pain, hearing loss, nosebleeds and tinnitus.   Eyes: Negative.   Respiratory:  Negative for cough, sputum production, shortness of breath, wheezing and stridor.   Cardiovascular:  Negative for chest pain, palpitations, orthopnea, claudication, leg swelling and PND.  Gastrointestinal:  Negative for abdominal pain, blood in stool, heartburn, melena, nausea and vomiting.  Genitourinary: Negative.   Musculoskeletal:  Negative for back pain, falls, myalgias and neck pain.  Neurological:  Negative for dizziness, tingling, speech change, focal weakness, seizures, loss of consciousness and weakness.  Psychiatric/Behavioral:  Negative for depression. The patient is not nervous/anxious and does not have insomnia.        Objective:   BP 130/86   Pulse 72   Ht 5\' 4"  (1.626 m)   Wt 168 lb (76.2 kg)   SpO2 96%   BMI 28.84 kg/m   Vitals:   05/29/23 1423  BP: 130/86  Pulse: 72  Height: 5\' 4"  (1.626 m)  Weight: 168 lb (76.2 kg)  SpO2: 96%  BMI (Calculated): 28.82    Physical Exam Vitals and nursing note reviewed.  Constitutional:      Appearance: Normal appearance.  HENT:     Head: Normocephalic and atraumatic.  Cardiovascular:     Rate and Rhythm: Normal rate and regular rhythm.     Pulses: Normal pulses.     Heart sounds: Normal heart sounds. No murmur heard.    No gallop.  Pulmonary:     Effort: Pulmonary effort is normal.     Breath sounds: No rales.  Chest:     Chest wall: No tenderness.  Abdominal:     General: Bowel sounds are normal.     Palpations: Abdomen is soft.     Tenderness: There is no right CVA tenderness or left CVA  tenderness.  Musculoskeletal:        General: Normal range of motion.     Cervical back: Normal range of motion and neck supple.     Right lower leg: No edema.     Left lower leg: No edema.  Lymphadenopathy:     Cervical: No cervical adenopathy.  Skin:    General: Skin is warm.  Neurological:  General: No focal deficit present.     Mental Status: She is alert.  Psychiatric:        Mood and Affect: Mood normal.        Behavior: Behavior normal.      Results for orders placed or performed in visit on 05/29/23  POC CREATINE & ALBUMIN,URINE  Result Value Ref Range   Microalbumin Ur, POC 10 mg/L   Creatinine, POC 50 mg/dL   Albumin/Creatinine Ratio, Urine, POC 30-300     Recent Results (from the past 2160 hour(s))  Comprehensive metabolic panel     Status: Abnormal   Collection Time: 05/14/23  8:40 AM  Result Value Ref Range   Glucose 127 (H) 70 - 99 mg/dL   BUN 15 6 - 24 mg/dL   Creatinine, Ser 1.61 0.57 - 1.00 mg/dL   eGFR 096 >04 VW/UJW/1.19   BUN/Creatinine Ratio 24 (H) 9 - 23   Sodium 142 134 - 144 mmol/L   Potassium 4.5 3.5 - 5.2 mmol/L   Chloride 104 96 - 106 mmol/L   CO2 24 20 - 29 mmol/L   Calcium 9.7 8.7 - 10.2 mg/dL   Total Protein 6.9 6.0 - 8.5 g/dL   Albumin 4.9 3.8 - 4.9 g/dL   Globulin, Total 2.0 1.5 - 4.5 g/dL   Albumin/Globulin Ratio 2.5 (H) 1.2 - 2.2   Bilirubin Total 0.8 0.0 - 1.2 mg/dL   Alkaline Phosphatase 63 44 - 121 IU/L   AST 21 0 - 40 IU/L   ALT 27 0 - 32 IU/L  Lipid Panel w/o Chol/HDL Ratio     Status: Abnormal   Collection Time: 05/14/23  8:40 AM  Result Value Ref Range   Cholesterol, Total 132 100 - 199 mg/dL   Triglycerides 147 (H) 0 - 149 mg/dL   HDL 41 >82 mg/dL   VLDL Cholesterol Cal 33 5 - 40 mg/dL   LDL Chol Calc (NIH) 58 0 - 99 mg/dL  Hgb N5A w/o eAG     Status: Abnormal   Collection Time: 05/14/23  8:40 AM  Result Value Ref Range   Hgb A1c MFr Bld 7.8 (H) 4.8 - 5.6 %    Comment:          Prediabetes: 5.7 - 6.4           Diabetes: >6.4          Glycemic control for adults with diabetes: <7.0   TSH     Status: None   Collection Time: 05/14/23  8:40 AM  Result Value Ref Range   TSH 1.370 0.450 - 4.500 uIU/mL  POC CREATINE & ALBUMIN,URINE     Status: None   Collection Time: 05/29/23  2:49 PM  Result Value Ref Range   Microalbumin Ur, POC 10 mg/L   Creatinine, POC 50 mg/dL   Albumin/Creatinine Ratio, Urine, POC 30-300       Assessment & Plan:  Patient advised to continue taking all her medications.  Metformin has been added to [place].  Also to start a strict diet control and exercise program. Problem List Items Addressed This Visit     Hypertension   Relevant Orders   POC CREATINE & ALBUMIN,URINE (Completed)   Pure hypercholesterolemia   GERD (gastroesophageal reflux disease)   Coronary artery disease involving native coronary artery of native heart without angina pectoris   Allergic rhinitis, seasonal   Mixed hyperlipidemia   Type 2 diabetes mellitus with hyperglycemia, without long-term current use of insulin (HCC) -  Primary   Relevant Medications   metFORMIN (GLUCOPHAGE) 500 MG tablet    Return in about 3 months (around 08/29/2023).   Total time spent: 30 minutes  Margaretann Loveless, MD  05/29/2023   This document may have been prepared by [facility] Voice Recognition software and as such may include unintentional dictation errors.

## 2023-06-25 ENCOUNTER — Other Ambulatory Visit: Payer: Self-pay | Admitting: Cardiovascular Disease

## 2023-06-25 DIAGNOSIS — E785 Hyperlipidemia, unspecified: Secondary | ICD-10-CM

## 2023-07-03 ENCOUNTER — Encounter: Payer: Self-pay | Admitting: Cardiovascular Disease

## 2023-07-03 ENCOUNTER — Ambulatory Visit: Payer: BC Managed Care – PPO | Admitting: Cardiovascular Disease

## 2023-07-03 VITALS — BP 135/95 | HR 64 | Ht 64.0 in | Wt 164.4 lb

## 2023-07-03 DIAGNOSIS — E782 Mixed hyperlipidemia: Secondary | ICD-10-CM | POA: Diagnosis not present

## 2023-07-03 DIAGNOSIS — I2510 Atherosclerotic heart disease of native coronary artery without angina pectoris: Secondary | ICD-10-CM

## 2023-07-03 DIAGNOSIS — I214 Non-ST elevation (NSTEMI) myocardial infarction: Secondary | ICD-10-CM | POA: Diagnosis not present

## 2023-07-03 DIAGNOSIS — I10 Essential (primary) hypertension: Secondary | ICD-10-CM | POA: Diagnosis not present

## 2023-07-03 DIAGNOSIS — I200 Unstable angina: Secondary | ICD-10-CM

## 2023-07-03 NOTE — Progress Notes (Signed)
Cardiology Office Note   Date:  07/03/2023   ID:  Zoe [name], DOB 06/29/1966, MRN [id number]  PCP:  Orson Eva, NP  Cardiologist:  Adrian Blackwater, MD      History of Present Illness: Zoe [name] is a 57 y.o. female who presents for  Chief Complaint  Patient presents with   Follow-up    82mo F/U    Feeling fine      Past Medical History:  Diagnosis Date   Allergic rhinitis    Asthma    Coronary artery disease    Diabetes mellitus without complication (HCC)    Dysplastic nevi 08/30/2020   Right lateral distal deltoid, left lower back post waistline. Moderate atypia, close to margin   GERD (gastroesophageal reflux disease)    History of mammogram 07/18/10; 08/25/15   neg; neg   History of Papanicolaou smear of cervix 07/2013; 08/25/15   -/-; neg   Hypercholesteremia    Hypertension    IBS (irritable bowel syndrome)    Myocardial infarction (HCC) 02/2019     Past Surgical History:  Procedure Laterality Date   CARDIAC CATHETERIZATION     COLONOSCOPY  2001   COLONOSCOPY WITH PROPOFOL N/A 11/05/2019   Procedure: COLONOSCOPY WITH PROPOFOL;  Surgeon: Pasty Spillers, MD;  Location: [facility];  Service: Endoscopy;  Laterality: N/A;   CORONARY ANGIOPLASTY     DILATATION & CURETTAGE/HYSTEROSCOPY WITH MYOSURE N/A 02/29/2016   Procedure: DILATATION & CURETTAGE/HYSTEROSCOPY WITH MYOSURE;  Surgeon: Elenora Fender Ward, MD;  Location: [facility];  Service: Gynecology;  Laterality: N/A;   DILATION AND CURETTAGE OF UTERUS     EAR CYST EXCISION Left 2015   IN OFFICE   ESOPHAGOGASTRODUODENOSCOPY     INTRAUTERINE DEVICE (IUD) INSERTION N/A 02/29/2016   Procedure: INTRAUTERINE DEVICE (IUD) INSERTION;  Surgeon: Elenora Fender Ward, MD;  Location: [facility];  Service: Gynecology;  Laterality: N/A;   LEFT HEART CATH AND CORONARY ANGIOGRAPHY Left 03/16/2019   Procedure: LEFT HEART CATH AND CORONARY ANGIOGRAPHY;  Surgeon: Laurier Nancy, MD;  Location: [facility];   Service: Cardiovascular;  Laterality: Left;     Current Outpatient Medications  Medication Sig Dispense Refill   aspirin 81 MG tablet Take 81 mg by mouth daily.     Cholecalciferol (VITAMIN D-3 PO) Take 1,000 Units by mouth daily.     clopidogrel (PLAVIX) 75 MG tablet TAKE 1 TABLET BY MOUTH EVERY DAY 90 tablet 1   Coenzyme Q10 (CO Q 10 PO) Take 1 capsule by mouth daily.     FARXIGA 10 MG TABS tablet TAKE 1 TABLET BY MOUTH EVERY DAY IN THE MORNING 30 tablet 11   ibuprofen (ADVIL,MOTRIN) 600 MG tablet Take 1 tablet (600 mg total) by mouth every 6 (six) hours as needed. 30 tablet 0   isosorbide mononitrate (IMDUR) 30 MG 24 hr tablet TAKE 1 TABLET BY MOUTH EVERY DAY 90 tablet 2   losartan-hydrochlorothiazide (HYZAAR) 50-12.5 MG tablet TAKE 1 TABLET BY MOUTH EVERY DAY 90 tablet 0   metFORMIN (GLUCOPHAGE) 500 MG tablet Take 1 tablet (500 mg total) by mouth 2 (two) times daily with a meal. 60 tablet 4   metoprolol succinate (TOPROL-XL) 50 MG 24 hr tablet TAKE 1 TABLET BY MOUTH EVERY DAY 90 tablet 2   Multiple Vitamin (MULTIVITAMIN) tablet Take 1 tablet by mouth daily.      Omega-3 Fatty Acids (FISH OIL) 1200 MG CAPS Take 1 capsule by mouth daily.     pantoprazole (PROTONIX) 40  MG tablet TAKE 1 TABLET BY MOUTH EVERY DAY 90 tablet 1   rosuvastatin (CRESTOR) 40 MG tablet TAKE 1 TABLET BY MOUTH EVERY DAY 90 tablet 1   No current facility-administered medications for this visit.    Allergies:   Ace inhibitors and Neosporin wound cleanser [benzalkonium chloride]    Social History:   reports that she has never smoked. She has never used smokeless tobacco. She reports current alcohol use of about 3.0 - 5.0 standard drinks of alcohol per week. She reports that she does not use drugs.   Family History:  family history includes Diabetes in her brother and another family member; Heart disease in her father and paternal grandfather; Hyperlipidemia in her mother; Hypertension in her mother.    ROS:      Review of Systems  Constitutional: Negative.   HENT: Negative.    Eyes: Negative.   Respiratory: Negative.    Gastrointestinal: Negative.   Genitourinary: Negative.   Musculoskeletal: Negative.   Skin: Negative.   Neurological: Negative.   Endo/Heme/Allergies: Negative.   Psychiatric/Behavioral: Negative.    All other systems reviewed and are negative.     All other systems are reviewed and negative.    PHYSICAL EXAM: VS:  BP (!) 135/95   Pulse 64   Ht 5\' 4"  (1.626 m)   Wt 164 lb 6.4 oz (74.6 kg)   SpO2 97%   BMI 28.22 kg/m  , BMI Body mass index is 28.22 kg/m. Last weight:  Wt Readings from Last 3 Encounters:  07/03/23 164 lb 6.4 oz (74.6 kg)  05/29/23 168 lb (76.2 kg)  04/03/23 165 lb 9.6 oz (75.1 kg)     Physical Exam Constitutional:      Appearance: Normal appearance.  Cardiovascular:     Rate and Rhythm: Normal rate and regular rhythm.     Heart sounds: Normal heart sounds.  Pulmonary:     Effort: Pulmonary effort is normal.     Breath sounds: Normal breath sounds.  Musculoskeletal:     Right lower leg: No edema.     Left lower leg: No edema.  Neurological:     Mental Status: She is alert.       EKG:   Recent Labs: 05/14/2023: ALT 27; BUN 15; Creatinine, Ser 0.63; Potassium 4.5; Sodium 142; TSH 1.370    Lipid Panel    Component Value Date/Time   CHOL 132 05/14/2023 0840   TRIG 199 (H) 05/14/2023 0840   HDL 41 05/14/2023 0840   LDLCALC 58 05/14/2023 0840      Other studies Reviewed: Additional studies/ records that were reviewed today include:  Review of the above records demonstrates:       No data to display            ASSESSMENT AND PLAN:    ICD-10-CM   1. Coronary artery disease involving native coronary artery of native heart without angina pectoris  I25.10    stable    2. Primary hypertension  I10    BP up, but repeat 130/80    3. NSTEMI (non-ST elevated myocardial infarction) (HCC)  I21.4     4. Unstable angina  (HCC)  I20.0     5. Mixed hyperlipidemia  E78.2        Problem List Items Addressed This Visit       Cardiovascular and Mediastinum   Hypertension   Coronary artery disease involving native coronary artery of native heart without angina pectoris - Primary   Unstable  angina (HCC)   NSTEMI (non-ST elevated myocardial infarction) (HCC)     Other   Mixed hyperlipidemia       Disposition:   Return in about 3 months (around 10/03/2023).    Total time spent: 35 minutes  Signed,  Adrian Blackwater, MD  07/03/2023 9:18 AM    [facility]

## 2023-07-16 ENCOUNTER — Ambulatory Visit: Payer: BC Managed Care – PPO | Admitting: Dermatology

## 2023-07-16 VITALS — BP 107/66 | HR 71

## 2023-07-16 DIAGNOSIS — L858 Other specified epidermal thickening: Secondary | ICD-10-CM

## 2023-07-16 DIAGNOSIS — D235 Other benign neoplasm of skin of trunk: Secondary | ICD-10-CM

## 2023-07-16 DIAGNOSIS — D229 Melanocytic nevi, unspecified: Secondary | ICD-10-CM

## 2023-07-16 DIAGNOSIS — D1801 Hemangioma of skin and subcutaneous tissue: Secondary | ICD-10-CM

## 2023-07-16 DIAGNOSIS — L578 Other skin changes due to chronic exposure to nonionizing radiation: Secondary | ICD-10-CM | POA: Diagnosis not present

## 2023-07-16 DIAGNOSIS — W908XXA Exposure to other nonionizing radiation, initial encounter: Secondary | ICD-10-CM | POA: Diagnosis not present

## 2023-07-16 DIAGNOSIS — D2272 Melanocytic nevi of left lower limb, including hip: Secondary | ICD-10-CM

## 2023-07-16 DIAGNOSIS — D225 Melanocytic nevi of trunk: Secondary | ICD-10-CM

## 2023-07-16 DIAGNOSIS — D2271 Melanocytic nevi of right lower limb, including hip: Secondary | ICD-10-CM

## 2023-07-16 DIAGNOSIS — L814 Other melanin hyperpigmentation: Secondary | ICD-10-CM | POA: Diagnosis not present

## 2023-07-16 DIAGNOSIS — Z1283 Encounter for screening for malignant neoplasm of skin: Secondary | ICD-10-CM | POA: Diagnosis not present

## 2023-07-16 DIAGNOSIS — L821 Other seborrheic keratosis: Secondary | ICD-10-CM

## 2023-07-16 DIAGNOSIS — D2371 Other benign neoplasm of skin of right lower limb, including hip: Secondary | ICD-10-CM

## 2023-07-16 DIAGNOSIS — D239 Other benign neoplasm of skin, unspecified: Secondary | ICD-10-CM

## 2023-07-16 DIAGNOSIS — Z86018 Personal history of other benign neoplasm: Secondary | ICD-10-CM

## 2023-07-16 NOTE — Progress Notes (Signed)
Follow-Up Visit   Subjective  Zoe [name] is a 57 y.o. female who presents for the following: Skin Cancer Screening and Full Body Skin Exam  The patient presents for Total-Body Skin Exam (TBSE) for skin cancer screening and mole check. The patient has spots, moles and lesions to be evaluated, some may be new or changing. She has a new mole on her right lateral thigh she would like checked. She also has a spot on her right medial calf that is sore at times. History of dysplastic nevi.    The following portions of the chart were reviewed this encounter and updated as appropriate: medications, allergies, medical history  Review of Systems:  No other skin or systemic complaints except as noted in HPI or Assessment and Plan.  Objective  Well appearing patient in no apparent distress; mood and affect are within normal limits.  A full examination was performed including scalp, head, eyes, ears, nose, lips, neck, chest, axillae, abdomen, back, buttocks, bilateral upper extremities, bilateral lower extremities, hands, feet, fingers, toes, fingernails, and toenails. All findings within normal limits unless otherwise noted below.   Relevant physical exam findings are noted in the Assessment and Plan.         Assessment & Plan   SKIN CANCER SCREENING PERFORMED TODAY.  ACTINIC DAMAGE - Chronic condition, secondary to cumulative UV/sun exposure - diffuse scaly erythematous macules with underlying dyspigmentation - Recommend daily broad spectrum sunscreen SPF 30+ to sun-exposed areas, reapply every 2 hours as needed.  - Staying in the shade or wearing long sleeves, sun glasses (UVA+UVB protection) and wide brim hats (4-inch brim around the entire circumference of the hat) are also recommended for sun protection.  - Call for new or changing lesions.  LENTIGINES, SEBORRHEIC KERATOSES, HEMANGIOMAS - Benign normal skin lesions - Benign-appearing - Call for any changes - SK R posterior  ear  MELANOCYTIC NEVI - Tan-brown and/or pink-flesh-colored symmetric macules and papules - 2.0 mm medium brown macule, regular, right lateral thigh - 0.6 x 0.5 cm speckled medium dark brown macule, right inferior breast - 0.8 x 0.5 cm speckled brown papule, left 4th plantar toe - Benign appearing on exam today, pt states no changes - Observation - Call clinic for new or changing moles - Recommend daily use of broad spectrum spf 30+ sunscreen to sun-exposed areas.   History of Dysplastic Nevi - No evidence of recurrence today - Recommend regular full body skin exams - Recommend daily broad spectrum sunscreen SPF 30+ to sun-exposed areas, reapply every 2 hours as needed.  - Call if any new or changing lesions are noted between office visits  DERMATOFIBROMA Exam: Firm pink/brown papulenodule with dimple sign of the right medial lower leg, right upper buttock Treatment Plan: A dermatofibroma is a benign growth possibly related to trauma, such as an insect bite, cut from shaving, or inflamed acne-type bump.  Treatment options to remove include shave or excision with resulting scar and risk of recurrence.  Since benign-appearing and not bothersome, will observe for now.   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 Genetic in nature. No cure. - Observe. - If desired, patient can use an emollient (moisturizer) containing ammonium lactate (AmLactin), urea or salicylic acid once a day to smooth the area  Recommend starting moisturizer with exfoliant (Urea, Salicylic acid, or Lactic acid) one to two times daily to help smooth rough and bumpy skin.  OTC options include Cetaphil Rough and Bumpy lotion (Urea),  Eucerin Roughness Relief lotion or spot treatment cream (Urea), CeraVe SA lotion/cream for Rough and Bumpy skin (Sal Acid), Gold Bond Rough and Bumpy cream (Sal Acid), and AmLactin 12% lotion/cream (Lactic Acid).  If applying in morning, also apply  sunscreen to sun-exposed areas, since these exfoliating moisturizers can increase sensitivity to sun.     Return in about 1 year (around 07/15/2024) for TBSE, Hx Dysplastic Nevus.  ICherlyn Labella, CMA, am acting as scribe for Willeen Niece, MD .   Documentation: I have reviewed the above documentation for accuracy and completeness, and I agree with the above.  Willeen Niece, MD

## 2023-07-16 NOTE — Patient Instructions (Addendum)
Recommend starting moisturizer with exfoliant (Urea, Salicylic acid, or Lactic acid) one to two times daily to help smooth rough and bumpy skin.  OTC options include Cetaphil Rough and Bumpy lotion (Urea), Eucerin Roughness Relief lotion or spot treatment cream (Urea), CeraVe SA lotion/cream for Rough and Bumpy skin (Sal Acid), Gold Bond Rough and Bumpy cream (Sal Acid), and AmLactin 12% lotion/cream (Lactic Acid).  If applying in morning, also apply sunscreen to sun-exposed areas, since these exfoliating moisturizers can increase sensitivity to sun.   Melanoma ABCDEs  Melanoma is the most dangerous type of skin cancer, and is the leading cause of death from skin disease.  You are more likely to develop melanoma if you: Have light-colored skin, light-colored eyes, or red or blond hair Spend a lot of time in the sun Tan regularly, either outdoors or in a tanning bed Have had blistering sunburns, especially during childhood Have a close family member who has had a melanoma Have atypical moles or large birthmarks  Early detection of melanoma is key since treatment is typically straightforward and cure rates are extremely high if we catch it early.   The first sign of melanoma is often a change in a mole or a new dark spot.  The ABCDE system is a way of remembering the signs of melanoma.  A for asymmetry:  The two halves do not match. B for border:  The edges of the growth are irregular. C for color:  A mixture of colors are present instead of an even brown color. D for diameter:  Melanomas are usually (but not always) greater than 6mm - the size of a pencil eraser. E for evolution:  The spot keeps changing in size, shape, and color.  Please check your skin once per month between visits. You can use a small mirror in front and a large mirror behind you to keep an eye on the back side or your body.   If you see any new or changing lesions before your next follow-up, please call to schedule a  visit.  Please continue daily skin protection including broad spectrum sunscreen SPF 30+ to sun-exposed areas, reapplying every 2 hours as needed when you're outdoors.   Staying in the shade or wearing long sleeves, sun glasses (UVA+UVB protection) and wide brim hats (4-inch brim around the entire circumference of the hat) are also recommended for sun protection.    Due to recent changes in healthcare laws, you may see results of your pathology and/or laboratory studies on MyChart before the doctors have had a chance to review them. We understand that in some cases there may be results that are confusing or concerning to you. Please understand that not all results are received at the same time and often the doctors may need to interpret multiple results in order to provide you with the best plan of care or course of treatment. Therefore, we ask that you please give [place] 2 business days to thoroughly review all your results before contacting the office for clarification. Should we see a critical lab result, you will be contacted sooner.   If You Need Anything After Your Visit  If you have any questions or concerns for your doctor, please call our main line at [phone] and press option 4 to reach your doctor's medical assistant. If no one answers, please leave a voicemail as directed and we will return your call as soon as possible. Messages left after 4 pm will be answered the following business day.  You may also send [place] a message via MyChart. We typically respond to MyChart messages within 1-2 business days.  For prescription refills, please ask your pharmacy to contact our office. Our fax number is [phone].  If you have an urgent issue when the clinic is closed that cannot wait until the next business day, you can page your doctor at the number below.    Please note that while we do our best to be available for urgent issues outside of office hours, we are not available 24/7.   If you  have an urgent issue and are unable to reach [place], you may choose to seek medical care at your doctor's office, retail clinic, urgent care center, or emergency room.  If you have a medical emergency, please immediately call 911 or go to the emergency department.  Pager Numbers  - Dr. Gwen Pounds: [phone]  - Dr. Roseanne Reno: [phone]  In the event of inclement weather, please call our main line at [phone] for an update on the status of any delays or closures.  Dermatology Medication Tips: Please keep the boxes that topical medications come in in order to help keep track of the instructions about where and how to use these. Pharmacies typically print the medication instructions only on the boxes and not directly on the medication tubes.   If your medication is too expensive, please contact our office at [phone] option 4 or send [place] a message through MyChart.   We are unable to tell what your co-pay for medications will be in advance as this is different depending on your insurance coverage. However, we may be able to find a substitute medication at lower cost or fill out paperwork to get insurance to cover a needed medication.   If a prior authorization is required to get your medication covered by your insurance company, please allow [place] 1-2 business days to complete this process.  Drug prices often vary depending on where the prescription is filled and some pharmacies may offer cheaper prices.  The website www.goodrx.com contains coupons for medications through different pharmacies. The prices here do not account for what the cost may be with help from insurance (it may be cheaper with your insurance), but the website can give you the price if you did not use any insurance.  - You can print the associated coupon and take it with your prescription to the pharmacy.  - You may also stop by our office during regular business hours and pick up a GoodRx coupon card.  - If you need your  prescription sent electronically to a different pharmacy, notify our office through [facility] or by phone at [phone] option 4.     Si Usted Necesita Algo Despus de Su Visita  Tambin puede enviarnos un mensaje a travs de Clinical cytogeneticist. Por lo general respondemos a los mensajes de MyChart en el transcurso de 1 a 2 das hbiles.  Para renovar recetas, por favor pida a su farmacia que se ponga en contacto con nuestra oficina. Annie Sable de fax es [place] [phone].  Si tiene un asunto urgente cuando la clnica est cerrada y que no puede esperar hasta el siguiente da hbil, puede llamar/localizar a su doctor(a) al nmero que aparece a continuacin.   Por favor, tenga en cuenta que aunque hacemos todo lo posible para estar disponibles para asuntos urgentes fuera del horario de [place], no estamos disponibles las 24 horas del da, los 7 [address]  [address] de la [place].   Si tiene un problema  urgente y no puede comunicarse con nosotros, puede optar por buscar atencin mdica  en el consultorio de su doctor(a), en una clnica privada, en un centro de atencin urgente o en una sala de emergencias.  Si tiene Engineer, drilling, por favor llame inmediatamente al 911 o vaya a la sala de emergencias.  Nmeros de bper  - Dr. Gwen Pounds: [phone]  - Dra. Moye: [phone]  - Dra. Roseanne Reno: [phone]  En caso de inclemencias del [place], por favor llame a Lacy Duverney principal al [phone] para una actualizacin sobre el [place] de cualquier retraso o cierre.  Consejos para la medicacin en dermatologa: Por favor, guarde las cajas en las que vienen los medicamentos de uso tpico para ayudarle a seguir las instrucciones sobre dnde y cmo usarlos. Las farmacias generalmente imprimen las instrucciones del medicamento slo en las cajas y no directamente en los tubos del [place].   Si su medicamento es muy caro, por favor, pngase en contacto con Rolm Gala llamando al [phone]  y presione la opcin 4 o envenos un mensaje a travs de Clinical cytogeneticist.   No podemos decirle cul ser su copago por los medicamentos por adelantado ya que esto es diferente dependiendo de la cobertura de su seguro. Sin embargo, es posible que podamos encontrar un medicamento sustituto a Audiological scientist un formulario para que el seguro cubra el medicamento que se considera necesario.   Si se requiere una autorizacin previa para que su compaa de seguros [place] su medicamento, por favor permtanos de 1 a 2 das hbiles para completar [address]  Los precios de los medicamentos varan con frecuencia dependiendo del Environmental consultant de dnde se surte la receta y alguna farmacias pueden ofrecer precios ms baratos.  El sitio web www.goodrx.com tiene cupones para medicamentos de Health and safety inspector. Los precios aqu no tienen en cuenta lo que podra costar con la ayuda del seguro (puede ser ms barato con su seguro), pero el sitio web puede darle el precio si no utiliz Tourist information centre manager.  - Puede imprimir el cupn correspondiente y llevarlo con su receta a la farmacia.  - Tambin puede pasar por nuestra oficina durante el horario de atencin regular y Education officer, museum una tarjeta de cupones de GoodRx.  - Si necesita que su receta se enve electrnicamente a una farmacia diferente, informe a nuestra oficina a travs de MyChart de [place] o por telfono llamando al [phone] y presione la opcin 4.

## 2023-07-31 ENCOUNTER — Other Ambulatory Visit: Payer: Self-pay | Admitting: Internal Medicine

## 2023-07-31 ENCOUNTER — Other Ambulatory Visit: Payer: Self-pay | Admitting: Cardiovascular Disease

## 2023-07-31 DIAGNOSIS — E1165 Type 2 diabetes mellitus with hyperglycemia: Secondary | ICD-10-CM

## 2023-07-31 DIAGNOSIS — I10 Essential (primary) hypertension: Secondary | ICD-10-CM

## 2023-09-01 ENCOUNTER — Ambulatory Visit: Payer: BC Managed Care – PPO | Admitting: Cardiology

## 2023-09-08 ENCOUNTER — Other Ambulatory Visit: Payer: BC Managed Care – PPO

## 2023-09-08 DIAGNOSIS — I10 Essential (primary) hypertension: Secondary | ICD-10-CM

## 2023-09-08 DIAGNOSIS — E782 Mixed hyperlipidemia: Secondary | ICD-10-CM

## 2023-09-08 DIAGNOSIS — E1165 Type 2 diabetes mellitus with hyperglycemia: Secondary | ICD-10-CM

## 2023-09-08 DIAGNOSIS — E7800 Pure hypercholesterolemia, unspecified: Secondary | ICD-10-CM

## 2023-09-09 LAB — CBC WITH DIFFERENTIAL/PLATELET
Basophils Absolute: 0.1 10*3/uL (ref 0.0–0.2)
Basos: 1 %
EOS (ABSOLUTE): 0.1 10*3/uL (ref 0.0–0.4)
Eos: 2 %
Hematocrit: 44.9 % (ref 34.0–46.6)
Hemoglobin: 14.2 g/dL (ref 11.1–15.9)
Immature Grans (Abs): 0 10*3/uL (ref 0.0–0.1)
Immature Granulocytes: 0 %
Lymphocytes Absolute: 1.4 10*3/uL (ref 0.7–3.1)
Lymphs: 28 %
MCH: 27.7 pg (ref 26.6–33.0)
MCHC: 31.6 g/dL (ref 31.5–35.7)
MCV: 88 fL (ref 79–97)
Monocytes Absolute: 0.7 10*3/uL (ref 0.1–0.9)
Monocytes: 13 %
Neutrophils Absolute: 2.8 10*3/uL (ref 1.4–7.0)
Neutrophils: 56 %
Platelets: 199 10*3/uL (ref 150–450)
RBC: 5.12 x10E6/uL (ref 3.77–5.28)
RDW: 13.9 % (ref 11.7–15.4)
WBC: 5 10*3/uL (ref 3.4–10.8)

## 2023-09-09 LAB — CMP14+EGFR
ALT: 24 IU/L (ref 0–32)
AST: 17 IU/L (ref 0–40)
Albumin: 4.5 g/dL (ref 3.8–4.9)
Alkaline Phosphatase: 61 IU/L (ref 44–121)
BUN/Creatinine Ratio: 32 — ABNORMAL HIGH (ref 9–23)
BUN: 19 mg/dL (ref 6–24)
Bilirubin Total: 0.4 mg/dL (ref 0.0–1.2)
CO2: 22 mmol/L (ref 20–29)
Calcium: 9.7 mg/dL (ref 8.7–10.2)
Chloride: 106 mmol/L (ref 96–106)
Creatinine, Ser: 0.6 mg/dL (ref 0.57–1.00)
Globulin, Total: 2.2 g/dL (ref 1.5–4.5)
Glucose: 124 mg/dL — ABNORMAL HIGH (ref 70–99)
Potassium: 4.6 mmol/L (ref 3.5–5.2)
Sodium: 143 mmol/L (ref 134–144)
Total Protein: 6.7 g/dL (ref 6.0–8.5)
eGFR: 107 mL/min/{1.73_m2} (ref >59)

## 2023-09-09 LAB — LIPID PANEL
Chol/HDL Ratio: 3.4 ratio (ref 0.0–4.4)
Cholesterol, Total: 147 mg/dL (ref 100–199)
HDL: 43 mg/dL (ref >39)
LDL Chol Calc (NIH): 74 mg/dL (ref 0–99)
Triglycerides: 178 mg/dL — ABNORMAL HIGH (ref 0–149)
VLDL Cholesterol Cal: 30 mg/dL (ref 5–40)

## 2023-09-09 LAB — HEMOGLOBIN A1C
Est. average glucose Bld gHb Est-mCnc: 160 mg/dL
Hgb A1c MFr Bld: 7.2 % — ABNORMAL HIGH (ref 4.8–5.6)

## 2023-09-09 LAB — TSH: TSH: 1.53 u[IU]/mL (ref 0.450–4.500)

## 2023-09-15 ENCOUNTER — Other Ambulatory Visit: Payer: Self-pay | Admitting: Cardiovascular Disease

## 2023-09-15 DIAGNOSIS — R079 Chest pain, unspecified: Secondary | ICD-10-CM

## 2023-09-15 DIAGNOSIS — R002 Palpitations: Secondary | ICD-10-CM

## 2023-09-18 ENCOUNTER — Encounter: Payer: Self-pay | Admitting: Cardiology

## 2023-09-18 ENCOUNTER — Ambulatory Visit: Payer: BC Managed Care – PPO | Admitting: Cardiology

## 2023-09-18 VITALS — BP 116/78 | HR 69 | Ht 64.0 in | Wt 165.0 lb

## 2023-09-18 DIAGNOSIS — E1165 Type 2 diabetes mellitus with hyperglycemia: Secondary | ICD-10-CM

## 2023-09-18 DIAGNOSIS — I10 Essential (primary) hypertension: Secondary | ICD-10-CM | POA: Diagnosis not present

## 2023-09-18 DIAGNOSIS — E782 Mixed hyperlipidemia: Secondary | ICD-10-CM

## 2023-09-18 NOTE — Progress Notes (Signed)
Established Patient Office Visit  Subjective:  Patient ID: Zoe [name], female    DOB: 04-01-1966  Age: 57 y.o. MRN: [id number]  Chief Complaint  Patient presents with   Follow-up    3 month follow up    Patient in office for 3 month follow up, discuss recent lab results. Patient reports feeling well. No complaints today. Up to date on mammogram, pap smear, and colonoscopy.  Patient states she has not had a DEE in the past year, she will call to schedule an appointment.  Discussed recent lab work. Hgb A1c improved after the addition of metformin at previous visit. Patient admits to not changing her diet or exercising regularly. LDL at goal.     No other concerns at this time.   Past Medical History:  Diagnosis Date   Allergic rhinitis    Asthma    Coronary artery disease    Diabetes mellitus without complication (HCC)    Dysplastic nevi 08/30/2020   Right lateral distal deltoid, left lower back post waistline. Moderate atypia, close to margin   GERD (gastroesophageal reflux disease)    History of mammogram 07/18/10; 08/25/15   neg; neg   History of Papanicolaou smear of cervix 07/2013; 08/25/15   -/-; neg   Hypercholesteremia    Hypertension    IBS (irritable bowel syndrome)    Myocardial infarction (HCC) 02/2019    Past Surgical History:  Procedure Laterality Date   CARDIAC CATHETERIZATION     COLONOSCOPY  2001   COLONOSCOPY WITH PROPOFOL N/A 11/05/2019   Procedure: COLONOSCOPY WITH PROPOFOL;  Surgeon: Pasty Spillers, MD;  Location: [facility];  Service: Endoscopy;  Laterality: N/A;   CORONARY ANGIOPLASTY     DILATATION & CURETTAGE/HYSTEROSCOPY WITH MYOSURE N/A 02/29/2016   Procedure: DILATATION & CURETTAGE/HYSTEROSCOPY WITH MYOSURE;  Surgeon: Elenora Fender Ward, MD;  Location: [facility] OR[facility];  Service: Gynecology;  Laterality: N/A;   DILATION AND CURETTAGE OF UTERUS     EAR CYST EXCISION Left 2015   IN OFFICE   ESOPHAGOGASTRODUODENOSCOPY     INTRAUTERINE DEVICE  (IUD) INSERTION N/A 02/29/2016   Procedure: INTRAUTERINE DEVICE (IUD) INSERTION;  Surgeon: Elenora Fender Ward, MD;  Location: [facility] ORS;  Service: Gynecology;  Laterality: N/A;   LEFT HEART CATH AND CORONARY ANGIOGRAPHY Left 03/16/2019   Procedure: LEFT HEART CATH AND CORONARY ANGIOGRAPHY;  Surgeon: Laurier Nancy, MD;  Location: [facility] IN[facility]IVE [facility];  Service: Cardiovascular;  Laterality: Left;    Social History   Socioeconomic History   Marital status: Married    Spouse name: Not on file   Number of children: 2   Years of education: 6   Highest education level: Not on file  Occupational History   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  Other Topics Concern   Not on file  Social History Narrative   Not on file   Social Determinants of Health   Financial Resource Strain: Not on file  Food Insecurity: Not on file  Transportation Needs: Not on  file  Physical Activity: Not on file  Stress: Not on file  Social Connections: Not on file  Intimate Partner Violence: Not on file    Family History  Problem Relation Age of Onset   Hyperlipidemia Mother    Hypertension Mother    Heart disease Father        died from either MI or stroke   Diabetes Brother        TYPE 2   Heart disease Paternal Grandfather    Diabetes Other        TYPE 2   Cancer Neg Hx    Breast cancer Neg Hx     Allergies  Allergen Reactions   Ace Inhibitors Cough    Patient cannot recall the specific ACE inhibitor that caused her cough.   Neosporin Wound Cleanser [Benzalkonium Chloride] Rash and Other (See Comments)    Swelling /  redness    Review of Systems  Constitutional: Negative.   HENT: Negative.    Eyes: Negative.   Respiratory: Negative.  Negative for shortness of breath.   Cardiovascular: Negative.  Negative for chest pain.  Gastrointestinal: Negative.  Negative for abdominal pain, constipation and diarrhea.  Genitourinary: Negative.   Musculoskeletal:  Negative for joint pain and myalgias.  Skin: Negative.   Neurological: Negative.  Negative for dizziness and headaches.  Endo/Heme/Allergies: Negative.   All other systems reviewed and are negative.      Objective:   BP 116/78   Pulse 69   Ht 5\' 4"  (1.626 m)   Wt 165 lb (74.8 kg)   SpO2 96%   BMI 28.32 kg/m   Vitals:   09/18/23 1513  BP: 116/78  Pulse: 69  Height: 5\' 4"  (1.626 m)  Weight: 165 lb (74.8 kg)  SpO2: 96%  BMI (Calculated): 28.31    Physical Exam Vitals and nursing note reviewed.  Constitutional:      Appearance: Normal appearance. She is normal weight.  HENT:     Head: Normocephalic and atraumatic.     Nose: Nose normal.     Mouth/Throat:     Mouth: Mucous membranes are moist.  Eyes:     Extraocular Movements: Extraocular movements intact.     Conjunctiva/sclera: Conjunctivae normal.     Pupils: Pupils are equal, round, and reactive to light.  Cardiovascular:     Rate and Rhythm: Normal rate and regular rhythm.     Pulses: Normal pulses.     Heart sounds: Normal heart sounds.  Pulmonary:     Effort: Pulmonary effort is normal.     Breath sounds: Normal breath sounds.  Abdominal:     General: Abdomen is flat. Bowel sounds are normal.     Palpations: Abdomen is soft.  Musculoskeletal:        General: Normal range of motion.     Cervical back: Normal range of motion.  Skin:    General: Skin is warm and dry.  Neurological:     General: No focal deficit present.     Mental Status: She is alert and oriented to person, place, and time.  Psychiatric:        Mood and Affect: Mood normal.        Behavior:  Behavior normal.        Thought Content: Thought content normal.        Judgment: Judgment normal.      No results found for any visits on 09/18/23.  Recent Results (from the past 2160 hour(s))  Hemoglobin  A1c     Status: Abnormal   Collection Time: 09/08/23  8:36 AM  Result Value Ref Range   Hgb A1c MFr Bld 7.2 (H) 4.8 - 5.6 %    Comment:          Prediabetes: 5.7 - 6.4          Diabetes: >6.4          Glycemic control for adults with diabetes: <7.0    Est. average glucose Bld gHb Est-mCnc 160 mg/dL  TSH     Status: None   Collection Time: 09/08/23  8:36 AM  Result Value Ref Range   TSH 1.530 0.450 - 4.500 uIU/mL  CMP14+EGFR     Status: Abnormal   Collection Time: 09/08/23  8:36 AM  Result Value Ref Range   Glucose 124 (H) 70 - 99 mg/dL   BUN 19 6 - 24 mg/dL   Creatinine, Ser 8.65 0.57 - 1.00 mg/dL   eGFR 784 >69 GE/XBM/8.41   BUN/Creatinine Ratio 32 (H) 9 - 23   Sodium 143 134 - 144 mmol/L   Potassium 4.6 3.5 - 5.2 mmol/L   Chloride 106 96 - 106 mmol/L   CO2 22 20 - 29 mmol/L   Calcium 9.7 8.7 - 10.2 mg/dL   Total Protein 6.7 6.0 - 8.5 g/dL   Albumin 4.5 3.8 - 4.9 g/dL   Globulin, Total 2.2 1.5 - 4.5 g/dL   Bilirubin Total 0.4 0.0 - 1.2 mg/dL   Alkaline Phosphatase 61 44 - 121 IU/L   AST 17 0 - 40 IU/L   ALT 24 0 - 32 IU/L  Lipid panel     Status: Abnormal   Collection Time: 09/08/23  8:36 AM  Result Value Ref Range   Cholesterol, Total 147 100 - 199 mg/dL   Triglycerides 324 (H) 0 - 149 mg/dL   HDL 43 >40 mg/dL   VLDL Cholesterol Cal 30 5 - 40 mg/dL   LDL Chol Calc (NIH) 74 0 - 99 mg/dL   Chol/HDL Ratio 3.4 0.0 - 4.4 ratio    Comment:                                   T. Chol/HDL Ratio                                             Men  Women                               1/2 Avg.Risk  3.4    3.3                                   Avg.Risk  5.0    4.4                                2X Avg.Risk  9.6    7.1                                3X Avg.Risk 23.4   11.0  CBC with Diff     Status: None   Collection Time: 09/08/23  8:36 AM  Result Value Ref Range   WBC 5.0 3.4 - 10.8 x10E3/uL   RBC 5.12 3.77 - 5.28 x10E6/uL   Hemoglobin 14.2 11.1 - 15.9 g/dL   Hematocrit 16.1 09.6 - 46.6 %   MCV 88 79 - 97 fL   MCH 27.7 26.6 - 33.0 pg   MCHC 31.6 31.5 - 35.7 g/dL   RDW 04.5 40.9 - 81.1 %   Platelets 199 150 - 450 x10E3/uL   Neutrophils 56 Not Estab. %   Lymphs 28 Not Estab. %   Monocytes 13 Not Estab. %   Eos 2 Not Estab. %   Basos 1 Not Estab. %   Neutrophils Absolute 2.8 1.4 - 7.0 x10E3/uL   Lymphocytes Absolute 1.4 0.7 - 3.1 x10E3/uL   Monocytes Absolute 0.7 0.1 - 0.9 x10E3/uL   EOS (ABSOLUTE) 0.1 0.0 - 0.4 x10E3/uL   Basophils Absolute 0.1 0.0 - 0.2 x10E3/uL   Immature Granulocytes 0 Not Estab. %   Immature Grans (Abs) 0.0 0.0 - 0.1 x10E3/uL      Assessment & Plan:  Schedule diabetic eye exam.  Follow a diabetic diet and exercise.   Problem List Items Addressed This Visit       Cardiovascular and Mediastinum   Hypertension     Endocrine   Type 2 diabetes mellitus with hyperglycemia, without long-term current use of insulin (HCC) - Primary     Other   Mixed hyperlipidemia    Return in about 4 months (around 01/19/2024) for with fasting labs prior.   Total time spent: 25 minutes  [employer], NP  09/18/2023   This document may have been prepared by Dragon Voice Recognition software and as such may include unintentional dictation errors.

## 2023-10-27 ENCOUNTER — Ambulatory Visit: Payer: BC Managed Care – PPO | Admitting: Cardiovascular Disease

## 2023-10-29 ENCOUNTER — Other Ambulatory Visit: Payer: Self-pay | Admitting: Cardiovascular Disease

## 2023-10-29 DIAGNOSIS — I10 Essential (primary) hypertension: Secondary | ICD-10-CM

## 2023-10-31 ENCOUNTER — Encounter: Payer: Self-pay | Admitting: Cardiovascular Disease

## 2023-10-31 ENCOUNTER — Ambulatory Visit: Payer: BC Managed Care – PPO | Admitting: Cardiovascular Disease

## 2023-10-31 VITALS — BP 126/90 | HR 68 | Ht 64.0 in | Wt 168.2 lb

## 2023-10-31 DIAGNOSIS — E1165 Type 2 diabetes mellitus with hyperglycemia: Secondary | ICD-10-CM

## 2023-10-31 DIAGNOSIS — E782 Mixed hyperlipidemia: Secondary | ICD-10-CM | POA: Diagnosis not present

## 2023-10-31 DIAGNOSIS — I2089 Other forms of angina pectoris: Secondary | ICD-10-CM

## 2023-10-31 DIAGNOSIS — I10 Essential (primary) hypertension: Secondary | ICD-10-CM

## 2023-10-31 DIAGNOSIS — I2510 Atherosclerotic heart disease of native coronary artery without angina pectoris: Secondary | ICD-10-CM | POA: Diagnosis not present

## 2023-10-31 NOTE — Progress Notes (Signed)
Cardiology Office Note   Date:  10/31/2023   ID:  Claudio Gaillard, DOB 09-05-1966, MRN [id number]  PCP:  Marisue Ivan, NP  Cardiologist:  Adrian Blackwater, MD      History of Present Illness: Zoe [name] is a 57 y.o. female who presents for  Chief Complaint  Patient presents with   Follow-up    4 month follow up    Doing well      Past Medical History:  Diagnosis Date   Allergic rhinitis    Asthma    Coronary artery disease    Diabetes mellitus without complication (HCC)    Dysplastic nevi 08/30/2020   Right lateral distal deltoid, left lower back post waistline. Moderate atypia, close to margin   GERD (gastroesophageal reflux disease)    History of mammogram 07/18/10; 08/25/15   neg; neg   History of Papanicolaou smear of cervix 07/2013; 08/25/15   -/-; neg   Hypercholesteremia    Hypertension    IBS (irritable bowel syndrome)    Myocardial infarction (HCC) 02/2019     Past Surgical History:  Procedure Laterality Date   CARDIAC CATHETERIZATION     COLONOSCOPY  2001   COLONOSCOPY WITH PROPOFOL N/A 11/05/2019   Procedure: COLONOSCOPY WITH PROPOFOL;  Surgeon: Pasty Spillers, MD;  Location: [facility];  Service: Endoscopy;  Laterality: N/A;   CORONARY ANGIOPLASTY     DILATATION & CURETTAGE/HYSTEROSCOPY WITH MYOSURE N/A 02/29/2016   Procedure: DILATATION & CURETTAGE/HYSTEROSCOPY WITH MYOSURE;  Surgeon: Elenora Fender Ward, MD;  Location: [facility];  Service: Gynecology;  Laterality: N/A;   DILATION AND CURETTAGE OF UTERUS     EAR CYST EXCISION Left 2015   IN OFFICE   ESOPHAGOGASTRODUODENOSCOPY     INTRAUTERINE DEVICE (IUD) INSERTION N/A 02/29/2016   Procedure: INTRAUTERINE DEVICE (IUD) INSERTION;  Surgeon: Elenora Fender Ward, MD;  Location: [facility];  Service: Gynecology;  Laterality: N/A;   LEFT HEART CATH AND CORONARY ANGIOGRAPHY Left 03/16/2019   Procedure: LEFT HEART CATH AND CORONARY ANGIOGRAPHY;  Surgeon: Laurier Nancy, MD;  Location: [facility];  Service: Cardiovascular;  Laterality: Left;     Current Outpatient Medications  Medication Sig Dispense Refill   aspirin 81 MG tablet Take 81 mg by mouth daily.     Cholecalciferol (VITAMIN D-3 PO) Take 1,000 Units by mouth daily.     clopidogrel (PLAVIX) 75 MG tablet TAKE 1 TABLET BY MOUTH EVERY DAY 90 tablet 1   Coenzyme Q10 (CO Q 10 PO) Take 1 capsule by mouth daily.     FARXIGA 10 MG TABS tablet TAKE 1 TABLET BY MOUTH EVERY DAY IN THE MORNING 30 tablet 11   ibuprofen (ADVIL,MOTRIN) 600 MG tablet Take 1 tablet (600 mg total) by mouth every 6 (six) hours as needed. 30 tablet 0   isosorbide mononitrate (IMDUR) 30 MG 24 hr tablet TAKE 1 TABLET BY MOUTH EVERY DAY 90 tablet 2   losartan-hydrochlorothiazide (HYZAAR) 50-12.5 MG tablet TAKE 1 TABLET BY MOUTH EVERY DAY 90 tablet 0   metFORMIN (GLUCOPHAGE) 500 MG tablet TAKE 1 TABLET BY MOUTH 2 TIMES DAILY WITH A MEAL. 180 tablet 1   metoprolol succinate (TOPROL-XL) 50 MG 24 hr tablet TAKE 1 TABLET BY MOUTH EVERY DAY 90 tablet 2   Multiple Vitamin (MULTIVITAMIN) tablet Take 1 tablet by mouth daily.     Omega-3 Fatty Acids (FISH OIL) 1200 MG CAPS Take 1 capsule by mouth daily.     pantoprazole (PROTONIX) 40 MG tablet TAKE  1 TABLET BY MOUTH EVERY DAY 90 tablet 1   rosuvastatin (CRESTOR) 40 MG tablet TAKE 1 TABLET BY MOUTH EVERY DAY 90 tablet 1   No current facility-administered medications for this visit.    Allergies:   Ace inhibitors and Neosporin wound cleanser [benzalkonium chloride]    Social History:   reports that she has never smoked. She has never used smokeless tobacco. She reports current alcohol use of about 3.0 - 5.0 standard drinks of alcohol per week. She reports that she does not use drugs.   Family History:  family history includes Diabetes in her brother and another family member; Heart disease in her father and paternal grandfather; Hyperlipidemia in her mother; Hypertension in her mother.    ROS:     Review of  Systems  Constitutional: Negative.   HENT: Negative.    Eyes: Negative.   Respiratory: Negative.    Gastrointestinal: Negative.   Genitourinary: Negative.   Musculoskeletal: Negative.   Skin: Negative.   Neurological: Negative.   Endo/Heme/Allergies: Negative.   Psychiatric/Behavioral: Negative.    All other systems reviewed and are negative.     All other systems are reviewed and negative.    PHYSICAL EXAM: VS:  BP (!) 126/90   Pulse 68   Ht 5\' 4"  (1.626 m)   Wt 168 lb 3.2 oz (76.3 kg)   SpO2 95%   BMI 28.87 kg/m  , BMI Body mass index is 28.87 kg/m. Last weight:  Wt Readings from Last 3 Encounters:  10/31/23 168 lb 3.2 oz (76.3 kg)  09/18/23 165 lb (74.8 kg)  07/03/23 164 lb 6.4 oz (74.6 kg)     Physical Exam Constitutional:      Appearance: Normal appearance.  Cardiovascular:     Rate and Rhythm: Normal rate and regular rhythm.     Heart sounds: Normal heart sounds.  Pulmonary:     Effort: Pulmonary effort is normal.     Breath sounds: Normal breath sounds.  Musculoskeletal:     Right lower leg: No edema.     Left lower leg: No edema.  Neurological:     Mental Status: She is alert.       EKG:   Recent Labs: 09/08/2023: ALT 24; BUN 19; Creatinine, Ser 0.60; Hemoglobin 14.2; Platelets 199; Potassium 4.6; Sodium 143; TSH 1.530    Lipid Panel    Component Value Date/Time   CHOL 147 09/08/2023 0836   TRIG 178 (H) 09/08/2023 0836   HDL 43 09/08/2023 0836   CHOLHDL 3.4 09/08/2023 0836   LDLCALC 74 09/08/2023 0836      Other studies Reviewed: Additional studies/ records that were reviewed today include:  Review of the above records demonstrates:       No data to display            ASSESSMENT AND PLAN:    ICD-10-CM   1. Primary hypertension  I10     2. Type 2 diabetes mellitus with hyperglycemia, without long-term current use of insulin (HCC)  E11.65     3. Mixed hyperlipidemia  E78.2     4. Coronary artery disease involving native  coronary artery of native heart without angina pectoris  I25.10    stable    5. Stable angina (HCC)  I20.89        Problem List Items Addressed This Visit       Cardiovascular and Mediastinum   Hypertension - Primary   Coronary artery disease involving native coronary artery of native  heart without angina pectoris     Endocrine   Type 2 diabetes mellitus with hyperglycemia, without long-term current use of insulin (HCC)     Other   Mixed hyperlipidemia   Other Visit Diagnoses     Stable angina (HCC)              Disposition:   Return in about 5 months (around 03/30/2024).    Total time spent: 30 minutes  Signed,  Adrian Blackwater, MD  10/31/2023 3:42 PM    [facility]

## 2023-11-17 ENCOUNTER — Other Ambulatory Visit: Payer: Self-pay | Admitting: Cardiovascular Disease

## 2023-11-17 DIAGNOSIS — I2510 Atherosclerotic heart disease of native coronary artery without angina pectoris: Secondary | ICD-10-CM

## 2023-12-18 ENCOUNTER — Other Ambulatory Visit: Payer: Self-pay | Admitting: Cardiovascular Disease

## 2023-12-18 DIAGNOSIS — I2510 Atherosclerotic heart disease of native coronary artery without angina pectoris: Secondary | ICD-10-CM

## 2023-12-18 DIAGNOSIS — E7800 Pure hypercholesterolemia, unspecified: Secondary | ICD-10-CM

## 2023-12-18 DIAGNOSIS — I10 Essential (primary) hypertension: Secondary | ICD-10-CM

## 2023-12-18 DIAGNOSIS — E785 Hyperlipidemia, unspecified: Secondary | ICD-10-CM

## 2023-12-18 DIAGNOSIS — I2089 Other forms of angina pectoris: Secondary | ICD-10-CM

## 2024-01-01 ENCOUNTER — Other Ambulatory Visit: Payer: Self-pay | Admitting: Cardiovascular Disease

## 2024-01-01 ENCOUNTER — Other Ambulatory Visit: Payer: Self-pay | Admitting: Internal Medicine

## 2024-01-01 DIAGNOSIS — E1165 Type 2 diabetes mellitus with hyperglycemia: Secondary | ICD-10-CM

## 2024-01-01 DIAGNOSIS — I10 Essential (primary) hypertension: Secondary | ICD-10-CM

## 2024-01-14 ENCOUNTER — Other Ambulatory Visit: Payer: Self-pay | Admitting: Obstetrics and Gynecology

## 2024-01-14 ENCOUNTER — Encounter: Payer: Self-pay | Admitting: Obstetrics and Gynecology

## 2024-01-14 DIAGNOSIS — Z1231 Encounter for screening mammogram for malignant neoplasm of breast: Secondary | ICD-10-CM

## 2024-01-19 ENCOUNTER — Ambulatory Visit: Payer: Self-pay | Admitting: Cardiology

## 2024-02-05 ENCOUNTER — Ambulatory Visit: Payer: 59 | Admitting: Obstetrics and Gynecology

## 2024-02-05 ENCOUNTER — Encounter: Payer: Self-pay | Admitting: Obstetrics and Gynecology

## 2024-02-05 VITALS — BP 155/92 | HR 77 | Resp 16 | Ht 64.0 in | Wt 168.5 lb

## 2024-02-05 DIAGNOSIS — Z30431 Encounter for routine checking of intrauterine contraceptive device: Secondary | ICD-10-CM

## 2024-02-05 DIAGNOSIS — Z01419 Encounter for gynecological examination (general) (routine) without abnormal findings: Secondary | ICD-10-CM | POA: Diagnosis not present

## 2024-02-05 DIAGNOSIS — E119 Type 2 diabetes mellitus without complications: Secondary | ICD-10-CM

## 2024-02-05 DIAGNOSIS — E7800 Pure hypercholesterolemia, unspecified: Secondary | ICD-10-CM

## 2024-02-05 DIAGNOSIS — I10 Essential (primary) hypertension: Secondary | ICD-10-CM

## 2024-02-05 DIAGNOSIS — I2510 Atherosclerotic heart disease of native coronary artery without angina pectoris: Secondary | ICD-10-CM

## 2024-02-05 NOTE — Patient Instructions (Addendum)
 Preventive Care 56-56 Years Old, Female Preventive care refers to lifestyle choices and visits with your health care provider that can promote health and wellness. Preventive care visits are also called wellness exams. What can I expect for my preventive care visit? Counseling Your health care provider may ask you questions about your: Medical history, including: Past medical problems. Family medical history. Pregnancy history. Current health, including: Menstrual cycle. Method of birth control. Emotional well-being. Home life and relationship well-being. Sexual activity and sexual health. Lifestyle, including: Alcohol, nicotine or tobacco, and drug use. Access to firearms. Diet, exercise, and sleep habits. Work and work Astronomer. Sunscreen use. Safety issues such as seatbelt and bike helmet use. Physical exam Your health care provider will check your: Height and weight. These may be used to calculate your BMI (body mass index). BMI is a measurement that tells if you are at a healthy weight. Waist circumference. This measures the distance around your waistline. This measurement also tells if you are at a healthy weight and may help predict your risk of certain diseases, such as type 2 diabetes and high blood pressure. Heart rate and blood pressure. Body temperature. Skin for abnormal spots. What immunizations do I need?  Vaccines are usually given at various ages, according to a schedule. Your health care provider will recommend vaccines for you based on your age, medical history, and lifestyle or other factors, such as travel or where you work. What tests do I need? Screening Your health care provider may recommend screening tests for certain conditions. This may include: Lipid and cholesterol levels. Diabetes screening. This is done by checking your blood sugar (glucose) after you have not eaten for a while (fasting). Pelvic exam and Pap test. Hepatitis B test. Hepatitis C  test. HIV (human immunodeficiency virus) test. STI (sexually transmitted infection) testing, if you are at risk. Lung cancer screening. Colorectal cancer screening. Mammogram. Talk with your health care provider about when you should start having regular mammograms. This may depend on whether you have a family history of breast cancer. BRCA-related cancer screening. This may be done if you have a family history of breast, ovarian, tubal, or peritoneal cancers. Bone density scan. This is done to screen for osteoporosis. Talk with your health care provider about your test results, treatment options, and if necessary, the need for more tests. Follow these instructions at home: Eating and drinking  Eat a diet that includes fresh fruits and vegetables, whole grains, lean protein, and low-fat dairy products. Take vitamin and mineral supplements as recommended by your health care provider. Do not drink alcohol if: Your health care provider tells you not to drink. You are pregnant, may be pregnant, or are planning to become pregnant. If you drink alcohol: Limit how much you have to 0-1 drink a day. Know how much alcohol is in your drink. In the [place] one drink equals one 12 oz bottle of beer (355 mL), one 5 oz glass of wine (148 mL), or one 1 oz glass of hard liquor (44 mL). Lifestyle Brush your teeth every morning and night with fluoride toothpaste. Floss one time each day. Exercise for at least 30 minutes 5 or more days each week. Do not use any products that contain nicotine or tobacco. These products include cigarettes, chewing tobacco, and vaping devices, such as e-cigarettes. If you need help quitting, ask your health care provider. Do not use drugs. If you are sexually active, practice safe sex. Use a condom or other form of protection to  prevent STIs. If you do not wish to become pregnant, use a form of birth control. If you plan to become pregnant, see your health care provider for a  prepregnancy visit. Take aspirin only as told by your health care provider. Make sure that you understand how much to take and what form to take. Work with your health care provider to find out whether it is safe and beneficial for you to take aspirin daily. Find healthy ways to manage stress, such as: Meditation, yoga, or listening to music. Journaling. Talking to a trusted person. Spending time with friends and family. Minimize exposure to UV radiation to reduce your risk of skin cancer. Safety Always wear your seat belt while driving or riding in a vehicle. Do not drive: If you have been drinking alcohol. Do not ride with someone who has been drinking. When you are tired or distracted. While texting. If you have been using any mind-altering substances or drugs. Wear a helmet and other protective equipment during sports activities. If you have firearms in your house, make sure you follow all gun safety procedures. Seek help if you have been physically or sexually abused. What's next? Visit your health care provider once a year for an annual wellness visit. Ask your health care provider how often you should have your eyes and teeth checked. Stay up to date on all vaccines. This information is not intended to replace advice given to you by your health care provider. Make sure you discuss any questions you have with your health care provider. Document Revised: 05/30/2021 Document Reviewed: 05/30/2021 Elsevier Patient Education  2024 [employer] Breast Self-Awareness Breast self-awareness is knowing how your breasts look and feel. You need to: Check your breasts on a regular basis. Tell your doctor about any changes. Become familiar with the look and feel of your breasts. This can help you catch a breast problem while it is still small and can be treated. You should do breast self-exams even if you have breast implants. What you need: A mirror. A well-lit room. A pillow or other  soft object. How to do a breast self-exam Follow these steps to do a breast self-exam: Look for changes  Take off all the clothes above your waist. Stand in front of a mirror in a room with good lighting. Put your hands down at your sides. Compare your breasts in the mirror. Look for any difference between them, such as: A difference in shape. A difference in size. Wrinkles, dips, and bumps in one breast and not the other. Look at each breast for changes in the skin, such as: Redness. Scaly areas. Skin that has gotten thicker. Dimpling. Open sores (ulcers). Look for changes in your nipples, such as: Fluid coming out of a nipple. Fluid around a nipple. Bleeding. Dimpling. Redness. A nipple that looks pushed in (retracted), or that has changed position. Feel for changes Lie on your back. Feel each breast. To do this: Pick a breast to feel. Place a pillow under the shoulder closest to that breast. Put the arm closest to that breast behind your head. Feel the nipple area of that breast using the hand of your other arm. Feel the area with the pads of your three middle fingers by making small circles with your fingers. Use light, medium, and firm pressure. Continue the overlapping circles, moving downward over the breast. Keep making circles with your fingers. Stop when you feel your ribs. Start making circles with your fingers again, this time going  upward until you reach your collarbone. Then, make circles outward across your breast and into your armpit area. Squeeze your nipple. Check for discharge and lumps. Repeat these steps to check your other breast. Sit or stand in the tub or shower. With soapy water on your skin, feel each breast the same way you did when you were lying down. Write down what you find Writing down what you find can help you remember what to tell your doctor. Write down: What is normal for each breast. Any changes you find in each breast. These  include: The kind of changes you find. A tender or painful breast. Any lump you find. Write down its size and where it is. When you last had your monthly period (menstrual cycle). General tips If you are breastfeeding, the best time to check your breasts is after you feed your baby or after you use a breast pump. If you get monthly bleeding, the best time to check your breasts is 5-7 days after your monthly cycle ends. With time, you will become comfortable with the self-exam. You will also start to know if there are changes in your breasts. Contact a doctor if: You see a change in the shape or size of your breasts or nipples. You see a change in the skin of your breast or nipples, such as red or scaly skin. You have fluid coming from your nipples that is not normal. You find a new lump or thick area. You have breast pain. You have any concerns about your breast health. Summary Breast self-awareness includes looking for changes in your breasts and feeling for changes within your breasts. You should do breast self-awareness in front of a mirror in a well-lit room. If you get monthly periods (menstrual cycles), the best time to check your breasts is 5-7 days after your period ends. Tell your doctor about any changes you see in your breasts. Changes include changes in size, changes on the skin, painful or tender breasts, or fluid from your nipples that is not normal. This information is not intended to replace advice given to you by your health care provider. Make sure you discuss any questions you have with your health care provider. Document Revised: 05/09/2022 Document Reviewed: 10/04/2021 Elsevier Patient Education  2024 [employer].

## 2024-02-05 NOTE — Progress Notes (Signed)
 GYNECOLOGY ANNUAL PHYSICAL EXAM PROGRESS NOTE  Subjective:    Zoe [name] is a 58 y.o. G66P2002 female who presents for an annual exam.  The patient is sexually active. The patient participates in regular exercise: no. Has the patient ever been transfused or tattooed?: no. The patient reports that there is not domestic violence in her life.   The patient has the following complaints today: None  Menstrual History: Menarche age: 51 No LMP recorded. (Menstrual status: IUD).     Gynecologic History:  Contraception: IUD. Kyleena IUD, inserted 02/27/2022.  History of STI's:Denies Last Pap: 06/12/2022. Results were: normal. Notes h/o abnormal pap smears. Last mammogram: 01/15/2023. Results were: normal. Scheduled or next week Last colonoscopy: 11/05/2019. Results were: Normal. Repeat in 10 years.     Upstream - 02/05/24 1418       Pregnancy Intention Screening   Does the patient want to become pregnant in the next year? No    Does the patient's partner want to become pregnant in the next year? No    Would the patient like to discuss contraceptive options today? No      Contraception Wrap Up   Current Method IUD or IUS    End Method IUD or IUS    Contraception Counseling Provided No    How was the end contraceptive method provided? N/A               OB History  Gravida Para Term Preterm AB Living  2 2 2  0 0 2  SAB IAB Ectopic Multiple Live Births  0 0 0 0 2    # Outcome Date GA Lbr Len/2nd Weight Sex Type Anes PTL Lv  2 Term 01/14/00   8 lb (3.629 kg) M Vag-Spont   LIV     Name: Jean Rosenthal  1 Term 02/03/98   7 lb (3.175 kg) F Vag-Spont   LIV     Name: Colon Branch    Past Medical History:  Diagnosis Date   Allergic rhinitis    Asthma    Coronary artery disease    Diabetes mellitus without complication (HCC)    Dysplastic nevi 08/30/2020   Right lateral distal deltoid, left lower back post waistline. Moderate atypia, close to margin   GERD (gastroesophageal  reflux disease)    History of mammogram 07/18/10; 08/25/15   neg; neg   History of Papanicolaou smear of cervix 07/2013; 08/25/15   -/-; neg   Hypercholesteremia    Hypertension    IBS (irritable bowel syndrome)    Myocardial infarction (HCC) 02/2019    Past Surgical History:  Procedure Laterality Date   CARDIAC CATHETERIZATION     COLONOSCOPY  2001   COLONOSCOPY WITH PROPOFOL N/A 11/05/2019   Procedure: COLONOSCOPY WITH PROPOFOL;  Surgeon: Pasty Spillers, MD;  Location: [facility];  Service: Endoscopy;  Laterality: N/A;   CORONARY ANGIOPLASTY     DILATATION & CURETTAGE/HYSTEROSCOPY WITH MYOSURE N/A 02/29/2016   Procedure: DILATATION & CURETTAGE/HYSTEROSCOPY WITH MYOSURE;  Surgeon: Elenora Fender Ward, MD;  Location: [facility];  Service: Gynecology;  Laterality: N/A;   DILATION AND CURETTAGE OF UTERUS     EAR CYST EXCISION Left 2015   IN OFFICE   ESOPHAGOGASTRODUODENOSCOPY     INTRAUTERINE DEVICE (IUD) INSERTION N/A 02/29/2016   Procedure: INTRAUTERINE DEVICE (IUD) INSERTION;  Surgeon: Elenora Fender Ward, MD;  Location: [facility];  Service: Gynecology;  Laterality: N/A;   LEFT HEART CATH AND CORONARY ANGIOGRAPHY Left 03/16/2019   Procedure: LEFT HEART CATH  AND CORONARY ANGIOGRAPHY;  Surgeon: Laurier Nancy, MD;  Location: [facility];  Service: Cardiovascular;  Laterality: Left;    Family History  Problem Relation Age of Onset   Hyperlipidemia Mother    Hypertension Mother    Heart disease Father        died from either MI or stroke   Diabetes Brother        TYPE 2   Heart disease Paternal Grandfather    Diabetes Other        TYPE 2   Cancer Neg Hx    Breast cancer Neg Hx     Social History   Socioeconomic History   Marital status: Married    Spouse name: Not on file   Number of children: 2   Years of education: 18   Highest education level: Not on file  Occupational History   Occupation: Teacher    Comment: [employer] - 2[employer]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  Other Topics Concern   Not on file  Social History Narrative   Not on file   Social Drivers of Health   Financial Resource Strain: Not on file  Food Insecurity: Not on file  Transportation Needs: Not on file  Physical Activity: Not on file  Stress: Not on file  Social Connections: Not on file  Intimate Partner Violence: Not on file    Current Outpatient Medications on File Prior to Visit  Medication Sig Dispense Refill   aspirin 81 MG tablet Take 81 mg by mouth daily.     Cholecalciferol (VITAMIN D-3 PO) Take 1,000 Units by mouth daily.     clopidogrel (PLAVIX) 75 MG tablet TAKE 1 TABLET BY MOUTH EVERY DAY 90 tablet 1   Coenzyme Q10 (CO Q 10 PO) Take 1 capsule by mouth daily.     FARXIGA 10 MG TABS tablet TAKE 1 TABLET BY MOUTH EVERY DAY IN THE MORNING 30 tablet 11   ibuprofen (ADVIL,MOTRIN) 600 MG tablet Take 1 tablet (600 mg total) by mouth every 6 (six) hours as needed. 30 tablet 0   isosorbide mononitrate (IMDUR) 30 MG 24 hr tablet TAKE 1 TABLET BY MOUTH EVERY DAY 90 tablet 2   losartan-hydrochlorothiazide (HYZAAR) 50-12.5 MG tablet TAKE 1 TABLET BY MOUTH EVERY DAY 90 tablet 0   metFORMIN (GLUCOPHAGE) 500 MG tablet TAKE 1 TABLET BY MOUTH 2 TIMES DAILY WITH A MEAL. 180 tablet 1   metoprolol succinate (TOPROL-XL) 50 MG 24 hr tablet TAKE 1 TABLET BY MOUTH EVERY DAY 90 tablet 2   Omega-3 Fatty Acids (FISH OIL) 1200 MG CAPS Take 1 capsule by mouth daily.     pantoprazole (PROTONIX) 40 MG tablet TAKE 1 TABLET BY MOUTH EVERY DAY 90 tablet 1   rosuvastatin (CRESTOR) 40 MG tablet TAKE 1 TABLET BY MOUTH EVERY DAY 90 tablet 1   No current  facility-administered medications on file prior to visit.    Allergies  Allergen Reactions   Ace Inhibitors Cough    Patient cannot recall the specific ACE inhibitor that caused  her cough.   Neosporin Wound Cleanser [Benzalkonium Chloride] Rash and Other (See Comments)    Swelling / redness     Review of Systems Constitutional: negative for chills, fatigue, fevers and sweats Eyes: negative for irritation, redness and visual disturbance Ears, nose, mouth, throat, and face: negative for hearing loss, nasal congestion, snoring and tinnitus Respiratory: negative for asthma, cough, sputum Cardiovascular: negative for chest pain, dyspnea, exertional chest pressure/discomfort, irregular heart beat, palpitations and syncope Gastrointestinal: negative for abdominal pain, change in bowel habits, nausea and vomiting Genitourinary: negative for abnormal menstrual periods, genital lesions, sexual problems and vaginal discharge, dysuria and urinary incontinence Integument/breast: negative for breast lump, breast tenderness and nipple discharge Hematologic/lymphatic: negative for bleeding and easy bruising Musculoskeletal:negative for back pain and muscle weakness Neurological: negative for dizziness, headaches, vertigo and weakness Endocrine: negative for diabetic symptoms including polydipsia, polyuria and skin dryness Allergic/Immunologic: negative for hay fever and urticaria      Objective:  Blood pressure (!) 140/84, pulse 84, resp. rate 16, height 5\' 4"  (1.626 m), weight 168 lb 8 oz (76.4 kg).  Body mass index is 28.92 kg/m.    General Appearance:    Alert, cooperative, no distress, appears stated age  Head:    Normocephalic, without obvious abnormality, atraumatic  Eyes:    PERRL, conjunctiva/corneas clear, EOM's intact, both eyes  Ears:    Normal external ear canals, both ears  Nose:   Nares normal, septum midline, mucosa normal, no drainage or sinus tenderness  Throat:   Lips, mucosa,  and tongue normal; teeth and gums normal  Neck:   Supple, symmetrical, trachea midline, no adenopathy; thyroid: no enlargement/tenderness/nodules; no carotid bruit or JVD  Back:     Symmetric, no curvature, ROM normal, no CVA tenderness  Lungs:     Clear to auscultation bilaterally, respirations unlabored  Chest Wall:    No tenderness or deformity   Heart:    Regular rate and rhythm, S1 and S2 normal, no murmur, rub or gallop  Breast Exam:    No tenderness, masses, or nipple abnormality  Abdomen:     Soft, non-tender, bowel sounds active all four quadrants, no masses, no organomegaly.    Genitalia:    Pelvic:external genitalia normal, vagina without lesions, discharge, or tenderness, rectovaginal septum  normal. Cervix normal in appearance, no cervical motion tenderness, no adnexal masses or tenderness.  Uterus normal size, shape, mobile, regular contours, nontender.  Rectal:    Normal external sphincter.  No hemorrhoids appreciated. Internal exam not done.   Extremities:   Extremities normal, atraumatic, no cyanosis or edema  Pulses:   2+ and symmetric all extremities  Skin:   Skin color, texture, turgor normal, no rashes or lesions  Lymph nodes:   Cervical, supraclavicular, and axillary nodes normal  Neurologic:   CNII-XII intact, normal strength, sensation and reflexes throughout   .  Labs:  Lab Results  Component Value Date   WBC 5.0 09/08/2023   HGB 14.2 09/08/2023   HCT 44.9 09/08/2023   MCV 88 09/08/2023   PLT 199 09/08/2023    Lab Results  Component Value Date   CREATININE 0.60 09/08/2023   BUN 19 09/08/2023   NA 143 09/08/2023   K 4.6 09/08/2023   CL 106 09/08/2023   CO2 22 09/08/2023    Lab Results  Component Value Date   ALT 24 09/08/2023   AST 17 09/08/2023   ALKPHOS 61 09/08/2023   BILITOT 0.4 09/08/2023    Lab Results  Component  Value Date   TSH 1.530 09/08/2023     Assessment:   1. Encounter for well woman exam with routine gynecological exam   2.  IUD check up   3. Primary hypertension   4. Controlled type 2 diabetes mellitus without complication, without long-term current use of insulin (HCC)   5. Pure hypercholesterolemia   6. Coronary artery disease involving native coronary artery of native heart without angina pectoris      Plan:  Blood tests: Labs done q 4 months with PCP. Breast self exam technique reviewed and patient encouraged to perform self-exam monthly. Contraception: IUD. Discussed healthy lifestyle modifications. Mammogram  Scheduled on 02/13/24 Pap smear  UTD . Flu vaccine: Declined Encouraged other routine vaccinations (shingles, tetanus, etc when due).  PCP to manage medical comorbidities.  Follow up in 1 year for annual exam   Hildred Laser, MD [facility]

## 2024-02-13 ENCOUNTER — Ambulatory Visit
Admission: RE | Admit: 2024-02-13 | Discharge: 2024-02-13 | Disposition: A | Discharge status: Home / Self Care | Payer: 59 | Source: Ambulatory Visit | Attending: Obstetrics and Gynecology | Admitting: Obstetrics and Gynecology

## 2024-02-13 ENCOUNTER — Other Ambulatory Visit: Payer: 59

## 2024-02-13 DIAGNOSIS — Z1231 Encounter for screening mammogram for malignant neoplasm of breast: Secondary | ICD-10-CM | POA: Diagnosis present

## 2024-02-13 DIAGNOSIS — E1165 Type 2 diabetes mellitus with hyperglycemia: Secondary | ICD-10-CM

## 2024-02-13 DIAGNOSIS — E7800 Pure hypercholesterolemia, unspecified: Secondary | ICD-10-CM

## 2024-02-13 DIAGNOSIS — I10 Essential (primary) hypertension: Secondary | ICD-10-CM

## 2024-02-13 DIAGNOSIS — E782 Mixed hyperlipidemia: Secondary | ICD-10-CM

## 2024-02-14 LAB — CMP14+EGFR
ALT: 28 IU/L (ref 0–32)
AST: 19 IU/L (ref 0–40)
Albumin: 4.7 g/dL (ref 3.8–4.9)
Alkaline Phosphatase: 65 IU/L (ref 44–121)
BUN/Creatinine Ratio: 24 — ABNORMAL HIGH (ref 9–23)
BUN: 16 mg/dL (ref 6–24)
Bilirubin Total: 0.4 mg/dL (ref 0.0–1.2)
CO2: 22 mmol/L (ref 20–29)
Calcium: 9.6 mg/dL (ref 8.7–10.2)
Chloride: 105 mmol/L (ref 96–106)
Creatinine, Ser: 0.66 mg/dL (ref 0.57–1.00)
Globulin, Total: 2 g/dL (ref 1.5–4.5)
Glucose: 151 mg/dL — ABNORMAL HIGH (ref 70–99)
Potassium: 4.3 mmol/L (ref 3.5–5.2)
Sodium: 143 mmol/L (ref 134–144)
Total Protein: 6.7 g/dL (ref 6.0–8.5)
eGFR: 104 mL/min/{1.73_m2} (ref >59)

## 2024-02-14 LAB — HEMOGLOBIN A1C
Est. average glucose Bld gHb Est-mCnc: 169 mg/dL
Hgb A1c MFr Bld: 7.5 % — ABNORMAL HIGH (ref 4.8–5.6)

## 2024-02-14 LAB — TSH: TSH: 1.49 u[IU]/mL (ref 0.450–4.500)

## 2024-02-14 LAB — LIPID PANEL
Chol/HDL Ratio: 3.3 ratio (ref 0.0–4.4)
Cholesterol, Total: 122 mg/dL (ref 100–199)
HDL: 37 mg/dL — ABNORMAL LOW (ref >39)
LDL Chol Calc (NIH): 62 mg/dL (ref 0–99)
Triglycerides: 128 mg/dL (ref 0–149)
VLDL Cholesterol Cal: 23 mg/dL (ref 5–40)

## 2024-02-18 ENCOUNTER — Encounter: Payer: Self-pay | Admitting: Obstetrics and Gynecology

## 2024-02-18 ENCOUNTER — Other Ambulatory Visit: Payer: Self-pay | Admitting: Obstetrics and Gynecology

## 2024-02-18 DIAGNOSIS — R928 Other abnormal and inconclusive findings on diagnostic imaging of breast: Secondary | ICD-10-CM

## 2024-02-24 ENCOUNTER — Encounter: Payer: Self-pay | Admitting: Obstetrics and Gynecology

## 2024-02-24 ENCOUNTER — Ambulatory Visit
Admission: RE | Admit: 2024-02-24 | Discharge: 2024-02-24 | Disposition: A | Discharge status: Home / Self Care | Source: Ambulatory Visit | Attending: Obstetrics and Gynecology | Admitting: Obstetrics and Gynecology

## 2024-02-24 ENCOUNTER — Other Ambulatory Visit: Payer: Self-pay | Admitting: Obstetrics and Gynecology

## 2024-02-24 DIAGNOSIS — R928 Other abnormal and inconclusive findings on diagnostic imaging of breast: Secondary | ICD-10-CM | POA: Diagnosis present

## 2024-02-24 DIAGNOSIS — N63 Unspecified lump in unspecified breast: Secondary | ICD-10-CM

## 2024-02-26 ENCOUNTER — Ambulatory Visit (INDEPENDENT_AMBULATORY_CARE_PROVIDER_SITE_OTHER): Payer: Self-pay | Admitting: Cardiology

## 2024-02-26 ENCOUNTER — Encounter: Payer: Self-pay | Admitting: Cardiology

## 2024-02-26 VITALS — BP 124/80 | HR 69 | Ht 64.0 in | Wt 162.0 lb

## 2024-02-26 DIAGNOSIS — E782 Mixed hyperlipidemia: Secondary | ICD-10-CM | POA: Diagnosis not present

## 2024-02-26 DIAGNOSIS — I10 Essential (primary) hypertension: Secondary | ICD-10-CM | POA: Diagnosis not present

## 2024-02-26 DIAGNOSIS — E1165 Type 2 diabetes mellitus with hyperglycemia: Secondary | ICD-10-CM

## 2024-02-26 NOTE — Progress Notes (Unsigned)
 Established Patient Office Visit  Subjective:  Patient ID: Zoe [name], female    DOB: 1966-07-21  Age: 58 y.o. MRN: [id number]  Chief Complaint  Patient presents with   Follow-up    4 Months Follow Up    Patient in office for 4 month follow up, discuss recent lab results. Patient doing well, no complaints today. Recent mammogram abnormal, scheduled for breast biopsy.  Discussed recent lab results. Patient states she has not been eating well. Hgb A1c elevated. LDL at goal. Will work on diet and exercise to lower Hgb A1c.  Blood pressure well controlled.     No other concerns at this time.   Past Medical History:  Diagnosis Date   Allergic rhinitis    Asthma    Coronary artery disease    Diabetes mellitus without complication (HCC)    Dysplastic nevi 08/30/2020   Right lateral distal deltoid, left lower back post waistline. Moderate atypia, close to margin   GERD (gastroesophageal reflux disease)    History of mammogram 07/18/10; 08/25/15   neg; neg   History of Papanicolaou smear of cervix 07/2013; 08/25/15   -/-; neg   Hypercholesteremia    Hypertension    IBS (irritable bowel syndrome)    Myocardial infarction (HCC) 02/2019    Past Surgical History:  Procedure Laterality Date   CARDIAC CATHETERIZATION     COLONOSCOPY  2001   COLONOSCOPY WITH PROPOFOL N/A 11/05/2019   Procedure: COLONOSCOPY WITH PROPOFOL;  Surgeon: Pasty Spillers, MD;  Location: [facility];  Service: Endoscopy;  Laterality: N/A;   CORONARY ANGIOPLASTY     DILATATION & CURETTAGE/HYSTEROSCOPY WITH MYOSURE N/A 02/29/2016   Procedure: DILATATION & CURETTAGE/HYSTEROSCOPY WITH MYOSURE;  Surgeon: Elenora Fender Ward, MD;  Location: [facility];  Service: Gynecology;  Laterality: N/A;   DILATION AND CURETTAGE OF UTERUS     EAR CYST EXCISION Left 2015   IN OFFICE   ESOPHAGOGASTRODUODENOSCOPY     INTRAUTERINE DEVICE (IUD) INSERTION N/A 02/29/2016   Procedure: INTRAUTERINE DEVICE (IUD) INSERTION;   Surgeon: Elenora Fender Ward, MD;  Location: [facility];  Service: Gynecology;  Laterality: N/A;   LEFT HEART CATH AND CORONARY ANGIOGRAPHY Left 03/16/2019   Procedure: LEFT HEART CATH AND CORONARY ANGIOGRAPHY;  Surgeon: Laurier Nancy, MD;  Location: [facility];  Service: Cardiovascular;  Laterality: Left;    Social History   Socioeconomic History   Marital status: Married    Spouse name: Not on file   Number of children: 2   Years of education: 22   Highest education level: Not on file  Occupational History   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  Other Topics Concern   Not on file  Social History Narrative   Not on file   Social Drivers of Health   Financial Resource Strain: Not on file  Food Insecurity: Not on file  Transportation Needs: Not on file  Physical Activity: Not on file  Stress: Not on file  Social Connections: Not on file  Intimate Partner Violence: Not on file    Family History  Problem Relation Age of Onset   Hyperlipidemia Mother    Hypertension Mother    Heart disease Father        died from either MI or stroke   Diabetes Brother        TYPE 2   Heart disease Paternal Grandfather    Diabetes Other        TYPE 2   Cancer Neg Hx    Breast cancer Neg Hx     Allergies  Allergen Reactions   Ace Inhibitors Cough    Patient cannot recall the specific ACE inhibitor that caused her cough.   Neosporin Wound Cleanser [Benzalkonium Chloride] Rash and Other (See Comments)    Swelling / redness    Outpatient Medications Prior to Visit  Medication Sig   aspirin 81 MG tablet  Take 81 mg by mouth daily.   Cholecalciferol (VITAMIN D-3 PO) Take 1,000 Units by mouth daily.   clopidogrel (PLAVIX) 75 MG tablet TAKE 1 TABLET BY MOUTH EVERY DAY   Coenzyme Q10 (CO Q 10 PO) Take 1 capsule by mouth daily.   FARXIGA 10 MG TABS tablet TAKE 1 TABLET BY MOUTH EVERY DAY IN THE MORNING   ibuprofen (ADVIL,MOTRIN) 600 MG tablet Take 1 tablet (600 mg total) by mouth every 6 (six) hours as needed.   isosorbide mononitrate (IMDUR) 30 MG 24 hr tablet TAKE 1 TABLET BY MOUTH EVERY DAY   losartan-hydrochlorothiazide (HYZAAR) 50-12.5 MG tablet TAKE 1 TABLET BY MOUTH EVERY DAY   metFORMIN (GLUCOPHAGE) 500 MG tablet TAKE 1 TABLET BY MOUTH 2 TIMES DAILY WITH A MEAL.   metoprolol succinate (TOPROL-XL) 50 MG 24 hr tablet TAKE 1 TABLET BY MOUTH EVERY DAY   Omega-3 Fatty Acids (FISH OIL) 1200 MG CAPS Take 1 capsule by mouth daily.   pantoprazole (PROTONIX) 40 MG tablet TAKE 1 TABLET BY MOUTH EVERY DAY   rosuvastatin (CRESTOR) 40 MG tablet TAKE 1 TABLET BY MOUTH EVERY DAY   No facility-administered medications prior to visit.    Review of Systems  Constitutional: Negative.   HENT: Negative.    Eyes: Negative.   Respiratory: Negative.  Negative for shortness of breath.   Cardiovascular: Negative.  Negative for chest pain.  Gastrointestinal: Negative.  Negative for abdominal pain, constipation and diarrhea.  Genitourinary: Negative.   Musculoskeletal:  Negative for joint pain and myalgias.  Skin: Negative.   Neurological: Negative.  Negative for dizziness and headaches.  Endo/Heme/Allergies: Negative.   All other systems reviewed and are negative.      Objective:   BP 124/80   Pulse 69   Ht 5\' 4"  (1.626 m)   Wt 162 lb (73.5 kg)   SpO2 95%   BMI 27.81 kg/m   Vitals:   02/26/24 1521  BP: 124/80  Pulse: 69  Height: 5\' 4"  (1.626 m)  Weight: 162 lb (73.5 kg)  SpO2: 95%  BMI (Calculated): 27.79    Physical Exam Vitals and nursing note reviewed.  Constitutional:       Appearance: Normal appearance. She is normal weight.  HENT:     Head: Normocephalic and atraumatic.     Nose: Nose normal.     Mouth/Throat:     Mouth: Mucous membranes are moist.  Eyes:     Extraocular Movements: Extraocular movements intact.     Conjunctiva/sclera: Conjunctivae normal.     Pupils: Pupils are equal, round, and reactive to light.  Cardiovascular:  Rate and Rhythm: Normal rate and regular rhythm.     Pulses: Normal pulses.     Heart sounds: Normal heart sounds.  Pulmonary:     Effort: Pulmonary effort is normal.     Breath sounds: Normal breath sounds.  Abdominal:     General: Abdomen is flat. Bowel sounds are normal.     Palpations: Abdomen is soft.  Musculoskeletal:        General: Normal range of motion.     Cervical back: Normal range of motion.  Skin:    General: Skin is warm and dry.  Neurological:     General: No focal deficit present.     Mental Status: She is alert and oriented to person, place, and time.  Psychiatric:        Mood and Affect: Mood normal.        Behavior: Behavior normal.        Thought Content: Thought content normal.        Judgment: Judgment normal.      No results found for any visits on 02/26/24.  Recent Results (from the past 2160 hours)  Hemoglobin A1c     Status: Abnormal   Collection Time: 02/13/24  8:31 AM  Result Value Ref Range   Hgb A1c MFr Bld 7.5 (H) 4.8 - 5.6 %    Comment:          Prediabetes: 5.7 - 6.4          Diabetes: >6.4          Glycemic control for adults with diabetes: <7.0    Est. average glucose Bld gHb Est-mCnc 169 mg/dL  TSH     Status: None   Collection Time: 02/13/24  8:31 AM  Result Value Ref Range   TSH 1.490 0.450 - 4.500 uIU/mL  CMP14+EGFR     Status: Abnormal   Collection Time: 02/13/24  8:31 AM  Result Value Ref Range   Glucose 151 (H) 70 - 99 mg/dL   BUN 16 6 - 24 mg/dL   Creatinine, Ser 6.04 0.57 - 1.00 mg/dL   eGFR 540 >98 JX/BJY/7.82   BUN/Creatinine Ratio 24 (H) 9 - 23    Sodium 143 134 - 144 mmol/L   Potassium 4.3 3.5 - 5.2 mmol/L   Chloride 105 96 - 106 mmol/L   CO2 22 20 - 29 mmol/L   Calcium 9.6 8.7 - 10.2 mg/dL   Total Protein 6.7 6.0 - 8.5 g/dL   Albumin 4.7 3.8 - 4.9 g/dL   Globulin, Total 2.0 1.5 - 4.5 g/dL   Bilirubin Total 0.4 0.0 - 1.2 mg/dL   Alkaline Phosphatase 65 44 - 121 IU/L   AST 19 0 - 40 IU/L   ALT 28 0 - 32 IU/L  Lipid panel     Status: Abnormal   Collection Time: 02/13/24  8:31 AM  Result Value Ref Range   Cholesterol, Total 122 100 - 199 mg/dL   Triglycerides 956 0 - 149 mg/dL   HDL 37 (L) >21 mg/dL   VLDL Cholesterol Cal 23 5 - 40 mg/dL   LDL Chol Calc (NIH) 62 0 - 99 mg/dL   Chol/HDL Ratio 3.3 0.0 - 4.4 ratio    Comment:                                   T. Chol/HDL Ratio  Men  Women                               1/2 Avg.Risk  3.4    3.3                                   Avg.Risk  5.0    4.4                                2X Avg.Risk  9.6    7.1                                3X Avg.Risk 23.4   11.0       Assessment & Plan:  Continue same medications. Work on diet and exercise to lower A1c. Keep breast biopsy appointment as scheduled.   Problem List Items Addressed This Visit       Cardiovascular and Mediastinum   Hypertension - Primary     Endocrine   Type 2 diabetes mellitus with hyperglycemia, without long-term current use of insulin (HCC)     Other   Mixed hyperlipidemia    Return in about 4 months (around 06/27/2024) for with fasting labs prior.   Total time spent: 25 minutes  [employer], NP  02/26/2024   This document may have been prepared by Dragon Voice Recognition software and as such may include unintentional dictation errors.

## 2024-03-03 ENCOUNTER — Ambulatory Visit
Admission: RE | Admit: 2024-03-03 | Discharge: 2024-03-03 | Disposition: A | Discharge status: Home / Self Care | Source: Ambulatory Visit | Attending: Obstetrics and Gynecology | Admitting: Obstetrics and Gynecology

## 2024-03-03 DIAGNOSIS — R928 Other abnormal and inconclusive findings on diagnostic imaging of breast: Secondary | ICD-10-CM

## 2024-03-03 DIAGNOSIS — N63 Unspecified lump in unspecified breast: Secondary | ICD-10-CM

## 2024-03-03 DIAGNOSIS — C50912 Malignant neoplasm of unspecified site of left female breast: Secondary | ICD-10-CM

## 2024-03-03 DIAGNOSIS — C50312 Malignant neoplasm of lower-inner quadrant of left female breast: Secondary | ICD-10-CM | POA: Diagnosis present

## 2024-03-03 HISTORY — DX: Malignant neoplasm of unspecified site of left female breast: C50.912

## 2024-03-03 HISTORY — PX: BREAST BIOPSY: SHX20

## 2024-03-03 MED ORDER — LIDOCAINE-EPINEPHRINE 1 %-1:100000 IJ SOLN
8.0000 mL | Freq: Once | INTRAMUSCULAR | Status: AC
  Administered 2024-03-03: 8 mL
  Filled 2024-03-03: qty 8

## 2024-03-03 MED ORDER — LIDOCAINE 1 % OPTIME INJ - NO CHARGE
2.0000 mL | Freq: Once | INTRAMUSCULAR | Status: AC
  Administered 2024-03-03: 2 mL
  Filled 2024-03-03: qty 2

## 2024-03-04 ENCOUNTER — Encounter: Payer: Self-pay | Admitting: *Deleted

## 2024-03-04 DIAGNOSIS — C50912 Malignant neoplasm of unspecified site of left female breast: Secondary | ICD-10-CM

## 2024-03-04 LAB — SURGICAL PATHOLOGY

## 2024-03-04 NOTE — Progress Notes (Signed)
 Ms. Gagen will see Dr. Cathie Hoops on 3/24 and Dr. Maia Plan on 3/25.

## 2024-03-04 NOTE — Progress Notes (Addendum)
 Received referral for newly diagnosed breast cancer from [facility].  Navigation initiated.  Discussed surgeons and medical oncologists.  She will decide who she would like to see and give me a call on Monday with her decision.   Her pathology report has been faxed to her PCP at [facility] medical per her request.

## 2024-03-06 ENCOUNTER — Other Ambulatory Visit: Payer: Self-pay | Admitting: Cardiovascular Disease

## 2024-03-06 DIAGNOSIS — I2510 Atherosclerotic heart disease of native coronary artery without angina pectoris: Secondary | ICD-10-CM

## 2024-03-08 ENCOUNTER — Encounter: Payer: Self-pay | Admitting: Obstetrics and Gynecology

## 2024-03-08 ENCOUNTER — Inpatient Hospital Stay

## 2024-03-08 ENCOUNTER — Encounter: Payer: Self-pay | Admitting: *Deleted

## 2024-03-08 ENCOUNTER — Encounter: Payer: Self-pay | Admitting: Oncology

## 2024-03-08 ENCOUNTER — Inpatient Hospital Stay: Attending: Oncology | Admitting: Oncology

## 2024-03-08 VITALS — BP 120/74 | HR 70 | Temp 97.3°F | Resp 18 | Wt 165.0 lb

## 2024-03-08 DIAGNOSIS — C50912 Malignant neoplasm of unspecified site of left female breast: Secondary | ICD-10-CM | POA: Insufficient documentation

## 2024-03-08 DIAGNOSIS — Z9189 Other specified personal risk factors, not elsewhere classified: Secondary | ICD-10-CM

## 2024-03-08 DIAGNOSIS — C50812 Malignant neoplasm of overlapping sites of left female breast: Secondary | ICD-10-CM | POA: Diagnosis present

## 2024-03-08 DIAGNOSIS — C50312 Malignant neoplasm of lower-inner quadrant of left female breast: Secondary | ICD-10-CM | POA: Diagnosis not present

## 2024-03-08 LAB — COMPREHENSIVE METABOLIC PANEL
ALT: 40 U/L (ref 0–44)
AST: 27 U/L (ref 15–41)
Albumin: 4.6 g/dL (ref 3.5–5.0)
Alkaline Phosphatase: 53 U/L (ref 38–126)
Anion gap: 12 (ref 5–15)
BUN: 13 mg/dL (ref 6–20)
CO2: 24 mmol/L (ref 22–32)
Calcium: 9.5 mg/dL (ref 8.9–10.3)
Chloride: 101 mmol/L (ref 98–111)
Creatinine, Ser: 0.63 mg/dL (ref 0.44–1.00)
GFR, Estimated: >60 mL/min (ref >60)
Glucose, Bld: 111 mg/dL — ABNORMAL HIGH (ref 70–99)
Potassium: 3.5 mmol/L (ref 3.5–5.1)
Sodium: 137 mmol/L (ref 135–145)
Total Bilirubin: 0.6 mg/dL (ref 0.0–1.2)
Total Protein: 7.3 g/dL (ref 6.5–8.1)

## 2024-03-08 LAB — CBC WITH DIFFERENTIAL/PLATELET
Abs Immature Granulocytes: 0.01 10*3/uL (ref 0.00–0.07)
Basophils Absolute: 0.1 10*3/uL (ref 0.0–0.1)
Basophils Relative: 1 %
Eosinophils Absolute: 0.1 10*3/uL (ref 0.0–0.5)
Eosinophils Relative: 2 %
HCT: 39.9 % (ref 36.0–46.0)
Hemoglobin: 13.5 g/dL (ref 12.0–15.0)
Immature Granulocytes: 0 %
Lymphocytes Relative: 30 %
Lymphs Abs: 1.7 10*3/uL (ref 0.7–4.0)
MCH: 27.4 pg (ref 26.0–34.0)
MCHC: 33.8 g/dL (ref 30.0–36.0)
MCV: 81.1 fL (ref 80.0–100.0)
Monocytes Absolute: 0.8 10*3/uL (ref 0.1–1.0)
Monocytes Relative: 15 %
Neutro Abs: 2.9 10*3/uL (ref 1.7–7.7)
Neutrophils Relative %: 52 %
Platelets: 217 10*3/uL (ref 150–400)
RBC: 4.92 MIL/uL (ref 3.87–5.11)
RDW: 13.9 % (ref 11.5–15.5)
WBC: 5.6 10*3/uL (ref 4.0–10.5)
nRBC: 0 % (ref 0.0–0.2)

## 2024-03-08 NOTE — Progress Notes (Signed)
 Accompanied patient and family to initial medical oncology appointment.   Reviewed Breast Cancer treatment handbook.   Care plan summary given to patient.   Reviewed outreach programs and cancer center services. She is going to think about if she would like to have genetic testing done or not.

## 2024-03-09 ENCOUNTER — Ambulatory Visit: Payer: Self-pay | Admitting: General Surgery

## 2024-03-09 LAB — CANCER ANTIGEN 15-3: CA 15-3: 13.2 U/mL (ref 0.0–25.0)

## 2024-03-09 LAB — CANCER ANTIGEN 27.29: CA 27.29: 17.7 U/mL (ref 0.0–38.6)

## 2024-03-09 NOTE — Assessment & Plan Note (Addendum)
 Left breast invasive ductal carcinoma, cT1b cN0 ER/PR +, HER2 - Pathology and radiology counseling: Discussed with the patient, the details of pathology including the type of breast cancer,the clinical staging, the significance of ER, PR and HER-2/neu receptors and the implications for treatment. After reviewing the pathology in detail, we proceeded to discuss the different treatment options between surgery, radiation, chemotherapy, antiestrogen therapies.  Treatment plan: 1.  Establish care with surgeon for breast conserving surgery with sentinel lymph node biopsy 2. Oncotype DX testing on the surgical specimen to determine if she would benefit from adjuvant chemo 3.  Adjuvant radiation 4.  Adjuvant antiestrogen therapy  Return to clinic based upon surgery pathology and Oncotype DX test result

## 2024-03-09 NOTE — H&P (View-Only) (Signed)
 PATIENT PROFILE: Zoe [name] is a 58 y.o. female who presents to the Clinic for consultation at the request of Dr. Valentino Saxon for evaluation of left breast cancer.  PCP:  Provider  History of Present Illness Zoe [name] is a 58 year old female with left breast invasive ductal carcinoma who presents for evaluation.  She initially presented with a screening mammogram on February 13, 2024, which showed a suspected mass in the left breast. A diagnostic mammography on February 24, 2024, confirmed a suspicious 8 mm mass in the lower inner quadrant of the left breast. A core biopsy performed on March 03, 2024, revealed invasive ductal carcinoma, grade 2, ER positive, PR positive, HER2 negative. No prior symptoms or palpable masses were noted before the screening mammogram. No nipple discharge or previous breast biopsies.  She recalls her first menstrual cycle at age 56 and has had an IUD for eight years, during which she has not had a menstrual cycle. She is unsure if the IUD contains estrogen and has not undergone hormone replacement therapy.  She denies previous breast biopsies.  Her current medications include aspirin and Plavix, which she has been taking for approximately 13 years following two catheterizations for heart issues, including a mild heart attack in 2020. She experiences easy bruising when on Plavix but has managed it since resuming the medication after her heart attack. She has had no stents placed and was treated with medication for a blockage.  She has a family history of breast cancer on her father's side, with four of his first cousins having had breast cancer.    PROBLEM LIST: Problem List  Date Reviewed: 04/11/2015          Noted   Lesion of spleen 04/11/2015   Overview    2 granulomas of spleen as incidental finding on CT done 03/15/15, plan to repeat CT in 6 months       Coronary artery disease involving native coronary artery of native heart without angina  pectoris 04/11/2015   Pure hypercholesterolemia 04/11/2015   Essential hypertension, benign 04/11/2015   Allergic rhinitis, seasonal 04/11/2015    GENERAL REVIEW OF SYSTEMS:   General ROS: negative for - chills, fatigue, fever, weight gain or weight loss Allergy and Immunology ROS: negative for - hives  Hematological and Lymphatic ROS: negative for - bleeding problems or bruising, negative for palpable nodes Endocrine ROS: negative for - heat or cold intolerance, hair changes Respiratory ROS: negative for - cough, shortness of breath or wheezing Cardiovascular ROS: no chest pain or palpitations GI ROS: negative for nausea, vomiting, abdominal pain, diarrhea, constipation Musculoskeletal ROS: negative for - joint swelling or muscle pain Neurological ROS: negative for - confusion, syncope Dermatological ROS: negative for pruritus and rash Psychiatric: negative for anxiety, depression, difficulty sleeping and memory loss  MEDICATIONS: Current Outpatient Medications  Medication Sig Dispense Refill   aspirin 81 MG EC tablet Take 81 mg by mouth once daily.     azithromycin (ZITHROMAX) 250 MG tablet Take 2 tablets (500mg ) by mouth on Day 1. Take 1 tablet (250mg ) by mouth on Days 2-5. 6 tablet 0   cetirizine (ZYRTEC) 10 MG tablet Take 10 mg by mouth once daily.     hydrocodone-homatropine (HYCODAN) 5-1.5 mg/5 mL syrup Take 5 mLs by mouth every 6 (six) hours as needed for Cough. 120 mL 0   losartan (COZAAR) 50 MG tablet Take 50 mg by mouth once daily.     multivitamin tablet Take 1 tablet by mouth  once daily.     omega-3 fatty acids/fish oil 340-1,000 mg capsule Take 1 capsule by mouth once daily.     rosuvastatin (CRESTOR) 40 MG tablet Take 40 mg by mouth once daily.     triamcinolone (NASACORT AQ) 55 mcg nasal spray Place 2 sprays into both nostrils once daily.     No current facility-administered medications for this visit.    ALLERGIES: Neosporin [benzalkonium chloride]  PAST MEDICAL  HISTORY: Past Medical History:  Diagnosis Date   Allergic state    Blockage of coronary artery of heart (CMS/HHS-HCC)    Diabetes mellitus, type 2 (CMS/HHS-HCC)    Heart disease     PAST SURGICAL HISTORY: Past Surgical History:  Procedure Laterality Date   Heart Catherization   2010   Cyst removal from ear Left 2015     FAMILY HISTORY: No family history on file.   SOCIAL HISTORY: Social History   Socioeconomic History   Marital status: Married  Tobacco Use   Smoking status: Never   Social Drivers of Corporate investment banker Strain: Low Risk  (03/08/2024)   Received from [facility]   Overall Financial Resource Strain (CARDIA)    Difficulty of Paying Living Expenses: Not very hard  Food Insecurity: No Food Insecurity (03/08/2024)   Received from [facility]   Hunger Vital Sign    Worried About Running Out of Food in the Last Year: Never true    Ran Out of Food in the Last Year: Never true  Transportation Needs: No Transportation Needs (03/08/2024)   Received from [facility] - Transportation    Lack of Transportation (Medical): No    Lack of Transportation (Non-Medical): No    PHYSICAL EXAM: Vitals:   03/09/24 1526  BP: 122/70  Pulse: 69   There is no height or weight on file to calculate BMI.     GENERAL: Alert, active, oriented x3  HEENT: Pupils equal reactive to light. Extraocular movements are intact. Sclera clear. Palpebral conjunctiva normal red color.Pharynx clear.  NECK: Supple with no palpable mass and no adenopathy.  LUNGS: Sound clear with no rales rhonchi or wheezes.  HEART: Regular rhythm S1 and S2 without murmur.  BREAST: breasts appear normal, no suspicious masses, no skin or nipple changes or axillary nodes.  ABDOMEN: Soft and depressible, nontender with no palpable mass, no hepatomegaly.  EXTREMITIES: Well-developed well-nourished symmetrical with no dependent edema.  NEUROLOGICAL: Awake alert oriented, facial expression  symmetrical, moving all extremities.  REVIEW OF DATA: I have reviewed the following data today: No visits with results within 3 Month(s) from this visit.  Latest known visit with results is:  Initial consult on 04/11/2015  Component Date Value   WBC (White Blood Cell Co* 04/11/2015 6.8    RBC (Red Blood Cell Coun* 04/11/2015 4.75    Hemoglobin 04/11/2015 13.3    Hematocrit 04/11/2015 38.2    MCV (Mean Corpuscular Vo* 04/11/2015 80.4    MCH (Mean Corpuscular He* 04/11/2015 28.0    MCHC (Mean Corpuscular H* 04/11/2015 34.8    Platelet Count 04/11/2015 197    RDW-CV (Red Cell Distrib* 04/11/2015 13.2    MPV (Mean Platelet Volum* 04/11/2015 9.6    Neutrophils 04/11/2015 4.00    Lymphocytes 04/11/2015 2.00    Mixed Count 04/11/2015 0.80    Neutrophil % 04/11/2015 58.7    Lymphocyte % 04/11/2015 29.7    Mixed % 04/11/2015 11.6      Results RADIOLOGY Screening mammography: Suspected mass on  the left breast (02/13/2024) Diagnostic mammography: Suspicious 8 mm mass in the lower inner quadrant of the left breast (02/24/2024)  PATHOLOGY Core biopsy: Invasive ductal carcinoma, grade 2, ER positive, PR positive, HER2 negative (03/03/2024)  Assessment & Plan Left breast invasive ductal carcinoma   She has invasive ductal carcinoma of the left breast, grade 2, ER positive, PR positive, and HER2 negative. The 8 mm mass was detected via screening mammogram on February 13, 2024, and confirmed by diagnostic mammography on February 24, 2024. A core biopsy on March 03, 2024, confirmed the diagnosis. Lumpectomy is preferred due to its smaller surgery, faster recovery, and same cure rate as mastectomy. It involves no drain, same-day discharge, and a return to normal activities within 1-2 weeks. Total mastectomy involves larger surgery, longer recovery, and potential avoidance of radiation therapy. A sentinel lymph node biopsy will check for lymph node involvement. There may be a need for radiation therapy  and anti-estrogen therapy post-surgery. Risks of lumpectomy include potential seroma formation and the need for re-excision if margins are not clear. Schedule lumpectomy with sentinel lymph node biopsy for next week. Coordinate pre-surgical placement of a surgical tag to guide lumpectomy. Instruct her to discontinue Plavix and aspirin 5 days prior to surgery. Consult with a gynecologist regarding her IUD and potential estrogen content. Coordinate post-surgical appointments with a radiation oncologist and medical oncologist.  Patient was oriented again about the pathology results. Surgical alternatives were discussed with patient including partial vs total mastectomy. Surgical technique and post operative care was discussed with patient. Risk of surgery was discussed with patient including but not limited to: wound infection, seroma, hematoma, brachial plexopathy, mondor's disease (thrombosis of small veins of breast), chronic wound pain, breast lymphedema, altered sensation to the nipple and cosmesis among others.   Coronary artery disease   She has coronary artery disease with previous catheterizations and a mild myocardial infarction in 2020, managed with Plavix and aspirin. Temporarily discontinue Plavix and aspirin 5 days prior to surgery to reduce bleeding risk. She has previously managed discontinuation of Plavix for procedures without complications.    Malignant neoplasm of lower-inner quadrant of left breast in female, estrogen receptor positive (CMS/HHS-HCC) [C50.312, Z17.0]  Patient and her husband verbalized understanding, all questions were answered, and were agreeable with the plan outlined above.     Carolan Shiver, MD  Electronically signed by Carolan Shiver, MD

## 2024-03-09 NOTE — H&P (Signed)
 PATIENT PROFILE: Zoe [name] is a 58 y.o. female who presents to the Clinic for consultation at the request of Dr. Valentino Saxon for evaluation of left breast cancer.  PCP:  Provider  History of Present Illness Zoe [name] is a 58 year old female with left breast invasive ductal carcinoma who presents for evaluation.  She initially presented with a screening mammogram on February 13, 2024, which showed a suspected mass in the left breast. A diagnostic mammography on February 24, 2024, confirmed a suspicious 8 mm mass in the lower inner quadrant of the left breast. A core biopsy performed on March 03, 2024, revealed invasive ductal carcinoma, grade 2, ER positive, PR positive, HER2 negative. No prior symptoms or palpable masses were noted before the screening mammogram. No nipple discharge or previous breast biopsies.  She recalls her first menstrual cycle at age 56 and has had an IUD for eight years, during which she has not had a menstrual cycle. She is unsure if the IUD contains estrogen and has not undergone hormone replacement therapy.  She denies previous breast biopsies.  Her current medications include aspirin and Plavix, which she has been taking for approximately 13 years following two catheterizations for heart issues, including a mild heart attack in 2020. She experiences easy bruising when on Plavix but has managed it since resuming the medication after her heart attack. She has had no stents placed and was treated with medication for a blockage.  She has a family history of breast cancer on her father's side, with four of his first cousins having had breast cancer.    PROBLEM LIST: Problem List  Date Reviewed: 04/11/2015          Noted   Lesion of spleen 04/11/2015   Overview    2 granulomas of spleen as incidental finding on CT done 03/15/15, plan to repeat CT in 6 months       Coronary artery disease involving native coronary artery of native heart without angina  pectoris 04/11/2015   Pure hypercholesterolemia 04/11/2015   Essential hypertension, benign 04/11/2015   Allergic rhinitis, seasonal 04/11/2015    GENERAL REVIEW OF SYSTEMS:   General ROS: negative for - chills, fatigue, fever, weight gain or weight loss Allergy and Immunology ROS: negative for - hives  Hematological and Lymphatic ROS: negative for - bleeding problems or bruising, negative for palpable nodes Endocrine ROS: negative for - heat or cold intolerance, hair changes Respiratory ROS: negative for - cough, shortness of breath or wheezing Cardiovascular ROS: no chest pain or palpitations GI ROS: negative for nausea, vomiting, abdominal pain, diarrhea, constipation Musculoskeletal ROS: negative for - joint swelling or muscle pain Neurological ROS: negative for - confusion, syncope Dermatological ROS: negative for pruritus and rash Psychiatric: negative for anxiety, depression, difficulty sleeping and memory loss  MEDICATIONS: Current Outpatient Medications  Medication Sig Dispense Refill   aspirin 81 MG EC tablet Take 81 mg by mouth once daily.     azithromycin (ZITHROMAX) 250 MG tablet Take 2 tablets (500mg ) by mouth on Day 1. Take 1 tablet (250mg ) by mouth on Days 2-5. 6 tablet 0   cetirizine (ZYRTEC) 10 MG tablet Take 10 mg by mouth once daily.     hydrocodone-homatropine (HYCODAN) 5-1.5 mg/5 mL syrup Take 5 mLs by mouth every 6 (six) hours as needed for Cough. 120 mL 0   losartan (COZAAR) 50 MG tablet Take 50 mg by mouth once daily.     multivitamin tablet Take 1 tablet by mouth  once daily.     omega-3 fatty acids/fish oil 340-1,000 mg capsule Take 1 capsule by mouth once daily.     rosuvastatin (CRESTOR) 40 MG tablet Take 40 mg by mouth once daily.     triamcinolone (NASACORT AQ) 55 mcg nasal spray Place 2 sprays into both nostrils once daily.     No current facility-administered medications for this visit.    ALLERGIES: Neosporin [benzalkonium chloride]  PAST MEDICAL  HISTORY: Past Medical History:  Diagnosis Date   Allergic state    Blockage of coronary artery of heart (CMS/HHS-HCC)    Diabetes mellitus, type 2 (CMS/HHS-HCC)    Heart disease     PAST SURGICAL HISTORY: Past Surgical History:  Procedure Laterality Date   Heart Catherization   2010   Cyst removal from ear Left 2015     FAMILY HISTORY: No family history on file.   SOCIAL HISTORY: Social History   Socioeconomic History   Marital status: Married  Tobacco Use   Smoking status: Never   Social Drivers of Corporate investment banker Strain: Low Risk  (03/08/2024)   Received from [facility]   Overall Financial Resource Strain (CARDIA)    Difficulty of Paying Living Expenses: Not very hard  Food Insecurity: No Food Insecurity (03/08/2024)   Received from [facility]   Hunger Vital Sign    Worried About Running Out of Food in the Last Year: Never true    Ran Out of Food in the Last Year: Never true  Transportation Needs: No Transportation Needs (03/08/2024)   Received from [facility] - Transportation    Lack of Transportation (Medical): No    Lack of Transportation (Non-Medical): No    PHYSICAL EXAM: Vitals:   03/09/24 1526  BP: 122/70  Pulse: 69   There is no height or weight on file to calculate BMI.     GENERAL: Alert, active, oriented x3  HEENT: Pupils equal reactive to light. Extraocular movements are intact. Sclera clear. Palpebral conjunctiva normal red color.Pharynx clear.  NECK: Supple with no palpable mass and no adenopathy.  LUNGS: Sound clear with no rales rhonchi or wheezes.  HEART: Regular rhythm S1 and S2 without murmur.  BREAST: breasts appear normal, no suspicious masses, no skin or nipple changes or axillary nodes.  ABDOMEN: Soft and depressible, nontender with no palpable mass, no hepatomegaly.  EXTREMITIES: Well-developed well-nourished symmetrical with no dependent edema.  NEUROLOGICAL: Awake alert oriented, facial expression  symmetrical, moving all extremities.  REVIEW OF DATA: I have reviewed the following data today: No visits with results within 3 Month(s) from this visit.  Latest known visit with results is:  Initial consult on 04/11/2015  Component Date Value   WBC (White Blood Cell Co* 04/11/2015 6.8    RBC (Red Blood Cell Coun* 04/11/2015 4.75    Hemoglobin 04/11/2015 13.3    Hematocrit 04/11/2015 38.2    MCV (Mean Corpuscular Vo* 04/11/2015 80.4    MCH (Mean Corpuscular He* 04/11/2015 28.0    MCHC (Mean Corpuscular H* 04/11/2015 34.8    Platelet Count 04/11/2015 197    RDW-CV (Red Cell Distrib* 04/11/2015 13.2    MPV (Mean Platelet Volum* 04/11/2015 9.6    Neutrophils 04/11/2015 4.00    Lymphocytes 04/11/2015 2.00    Mixed Count 04/11/2015 0.80    Neutrophil % 04/11/2015 58.7    Lymphocyte % 04/11/2015 29.7    Mixed % 04/11/2015 11.6      Results RADIOLOGY Screening mammography: Suspected mass on  the left breast (02/13/2024) Diagnostic mammography: Suspicious 8 mm mass in the lower inner quadrant of the left breast (02/24/2024)  PATHOLOGY Core biopsy: Invasive ductal carcinoma, grade 2, ER positive, PR positive, HER2 negative (03/03/2024)  Assessment & Plan Left breast invasive ductal carcinoma   She has invasive ductal carcinoma of the left breast, grade 2, ER positive, PR positive, and HER2 negative. The 8 mm mass was detected via screening mammogram on February 13, 2024, and confirmed by diagnostic mammography on February 24, 2024. A core biopsy on March 03, 2024, confirmed the diagnosis. Lumpectomy is preferred due to its smaller surgery, faster recovery, and same cure rate as mastectomy. It involves no drain, same-day discharge, and a return to normal activities within 1-2 weeks. Total mastectomy involves larger surgery, longer recovery, and potential avoidance of radiation therapy. A sentinel lymph node biopsy will check for lymph node involvement. There may be a need for radiation therapy  and anti-estrogen therapy post-surgery. Risks of lumpectomy include potential seroma formation and the need for re-excision if margins are not clear. Schedule lumpectomy with sentinel lymph node biopsy for next week. Coordinate pre-surgical placement of a surgical tag to guide lumpectomy. Instruct her to discontinue Plavix and aspirin 5 days prior to surgery. Consult with a gynecologist regarding her IUD and potential estrogen content. Coordinate post-surgical appointments with a radiation oncologist and medical oncologist.  Patient was oriented again about the pathology results. Surgical alternatives were discussed with patient including partial vs total mastectomy. Surgical technique and post operative care was discussed with patient. Risk of surgery was discussed with patient including but not limited to: wound infection, seroma, hematoma, brachial plexopathy, mondor's disease (thrombosis of small veins of breast), chronic wound pain, breast lymphedema, altered sensation to the nipple and cosmesis among others.   Coronary artery disease   She has coronary artery disease with previous catheterizations and a mild myocardial infarction in 2020, managed with Plavix and aspirin. Temporarily discontinue Plavix and aspirin 5 days prior to surgery to reduce bleeding risk. She has previously managed discontinuation of Plavix for procedures without complications.    Malignant neoplasm of lower-inner quadrant of left breast in female, estrogen receptor positive (CMS/HHS-HCC) [C50.312, Z17.0]  Patient and her husband verbalized understanding, all questions were answered, and were agreeable with the plan outlined above.     Carolan Shiver, MD  Electronically signed by Carolan Shiver, MD

## 2024-03-09 NOTE — Progress Notes (Signed)
 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 After reviewing the pathology in detail, we proceeded to discuss the different treatment options between surgery, radiation, chemotherapy, antiestrogen therapies.  Treatment plan: 1.  Establish care with surgeon for breast conserving surgery with sentinel lymph node biopsy 2. Oncotype DX testing on the surgical specimen to determine if she would benefit from adjuvant chemo 3.  Adjuvant radiation 4.  Adjuvant antiestrogen therapy  Return to clinic based upon surgery pathology and Oncotype DX test result    Orders Placed This Encounter  Procedures   CBC with Differential/Platelet    Standing Status:   Future    Number of Occurrences:   1    Expected Date:   03/08/2024    Expiration Date:   03/08/2025   Comprehensive metabolic panel    Standing Status:   Future    Number of Occurrences:   1    Expected Date:   03/08/2024    Expiration Date:   03/08/2025   Cancer antigen 27.29    Standing Status:   Future    Number of Occurrences:   1    Expected Date:   03/08/2024    Expiration Date:   03/08/2025   Cancer antigen 15-3    Standing Status:   Future    Number of Occurrences:   1    Expected Date:   03/08/2024    Expiration Date:   03/08/2025   Follow up TBD All questions were answered. The patient knows to call the clinic with any problems, questions or concerns. We spent sufficient time to discuss many aspect of care,  questions were answered to patient's satisfaction. A total of 60 minutes was spent on this visit.  With 15 minutes spent reviewing image findings, pathology reports, 40 minutes counseling the patient on the diagnosis, goal of care, chemotherapy treatments, side effects of the treatment, management of symptoms.  Additional 5 minutes was spent on answering patient's questions.   Rickard Patience, MD, PhD [facility] Hematology Oncology 03/08/2024    HISTORY OF PRESENTING ILLNESS:  Zoe [name] 58 y.o. female presents to establish care for left breast cancer I have reviewed her chart and materials related to her cancer extensively and collaborated history with the patient. Summary of oncologic history is as follows: Oncology History  Invasive ductal carcinoma of breast, female, left (HCC)  02/17/2024 Mammogram   Bilateral screening mammogram showed  FINDINGS: In the left breast, a possible mass warrants further evaluation. In the right breast, no findings suspicious for malignancy.   IMPRESSION: Further evaluation is suggested for a possible mass in the left breast.    02/24/2024 Mammogram   Unilateral left breast diagnostic mammogram  1. There is an 8 mm mass at the site of screening mammographic concern which is highly suspicious for malignancy. Recommend ultrasound-guided biopsy for definitive characterization. 2. No suspicious LEFT axillary adenopathy.   03/08/2024 Initial Diagnosis   Invasive ductal carcinoma of breast, female, left   Patient underwent left breast mass biopsy Pathology showed  1. Breast, left, needle  core biopsy, 8 o'clock, 8cmfn :       - INVASIVE DUCTAL CARCINOMA, GRADE 2, EXTENDING TO 0.6 CM IN MAXIMUM EXTENT, AND       INVOLVING TWO OF TWO BIOPSY FRAGMENTS.  SEE COMMENT.       - TUBULE FORMATION: SCORE 3       - NUCLEAR PLEOMORPHISM: SCORE 2       - MITOTIC COUNT: SCORE 1       - TOTAL SCORE: 6       - OVERALL GRADE:  2       - LYMPHOVASCULAR INVASION: NOT  IDENTIFIED       - CANCER LENGTH: 6 MM       - CALCIFICATIONS: NOT IDENTIFIED       - LYMPHOVASCULAR INVASION: NOT IDENTIFIED.   ER + 99% PR + 30%, Her2  negative (1+)    Menarche at age of 26 First live birth at age of 39 OCP use: >5 years, then switched to IUD History of hysterectomy: no Menopausal status: unsure History of HRT use: No History of chest radiation: no  Number of previous breast biopsies:  no  Denies family history of cancer, except that her father's cousin has breast cancer.    03/08/2024 Cancer Staging   Staging form: Breast, AJCC 8th Edition - Clinical stage from 03/08/2024: Stage IA (cT1b, cN0, cM0, G2, ER+, PR+, HER2-) - Signed by Rickard Patience, MD on 03/08/2024 Stage prefix: Initial diagnosis Histologic grading system: 3 grade system     MEDICAL HISTORY:  Past Medical History:  Diagnosis Date   Allergic rhinitis    Asthma    Coronary artery disease    Diabetes mellitus without complication (HCC)    Dysplastic nevi 08/30/2020   Right lateral distal deltoid, left lower back post waistline. Moderate atypia, close to margin   GERD (gastroesophageal reflux disease)    History of mammogram 07/18/10; 08/25/15   neg; neg   History of Papanicolaou smear of cervix 07/2013; 08/25/15   -/-; neg   Hypercholesteremia    Hypertension    IBS (irritable bowel syndrome)    Myocardial infarction (HCC) 02/2019    SURGICAL HISTORY: Past Surgical History:  Procedure Laterality Date   BREAST BIOPSY Left 03/03/2024   [place] Core Bx, venus clip- path pending   BREAST BIOPSY Left 03/03/2024   [place] LT BREAST BX W LOC DEV 1ST LESION IMG BX SPEC US GUIDE 03/03/2024 [facility]   CARDIAC CATHETERIZATION     COLONOSCOPY  2001   COLONOSCOPY WITH PROPOFOL N/A 11/05/2019   Procedure: COLONOSCOPY WITH PROPOFOL;  Surgeon: Pasty Spillers, MD;  Location: [facility];  Service: Endoscopy;  Laterality: N/A;   CORONARY ANGIOPLASTY     DILATATION & CURETTAGE/HYSTEROSCOPY WITH MYOSURE  N/A 02/29/2016   Procedure: DILATATION & CURETTAGE/HYSTEROSCOPY WITH MYOSURE;  Surgeon: Elenora Fender Ward, MD;  Location: [facility] ORS;  Service: Gynecology;  Laterality: N/A;   DILATION AND CURETTAGE OF UTERUS     EAR CYST EXCISION Left 2015   IN OFFICE   ESOPHAGOGASTRODUODENOSCOPY     INTRAUTERINE DEVICE (IUD) INSERTION N/A 02/29/2016   Procedure: INTRAUTERINE DEVICE (IUD) INSERTION;  Surgeon: Elenora Fender Ward, MD;  Location: [facility] ORS;  Service: Gynecology;  Laterality: N/A;   LEFT HEART CATH AND CORONARY ANGIOGRAPHY Left 03/16/2019   Procedure: LEFT HEART CATH AND CORONARY ANGIOGRAPHY;  Surgeon: Laurier Nancy, MD;  Location: [facility] INV[facility];  Service: Cardiovascular;  Laterality: Left;    SOCIAL HISTORY: Social History  Socioeconomic History   Marital status: Married    Spouse name: Not on file   Number of children: 2   Years of education: 47   Highest education level: Not on file  Occupational History   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  Other Topics Concern   Not on file  Social History Narrative   Not on file   Social Drivers of Health   Financial Resource Strain: Low Risk  (03/08/2024)   Overall Financial Resource Strain (CARDIA)    Difficulty of Paying Living Expenses: Not very hard  Food Insecurity: No Food Insecurity (03/08/2024)   Hunger Vital Sign    Worried About Running Out of Food in the Last Year: Never true    Ran Out of Food in the Last Year: Never true  Transportation Needs: No Transportation Needs (03/08/2024)   PRAPARE - Administrator, Civil Service (Medical): No     Lack of Transportation (Non-Medical): No  Physical Activity: Not on file  Stress: Not on file  Social Connections: Not on file  Intimate Partner Violence: Not At Risk (03/08/2024)   Humiliation, Afraid, Rape, and Kick questionnaire    Fear of Current or Ex-Partner: No    Emotionally Abused: No    Physically Abused: No    Sexually Abused: No    FAMILY HISTORY: Family History  Problem Relation Age of Onset   Hyperlipidemia Mother    Hypertension Mother    Heart disease Father        died from either MI or stroke   Diabetes Brother        TYPE 2   Heart disease Paternal Grandfather    Diabetes Other        TYPE 2   Cancer Neg Hx    Breast cancer Neg Hx     ALLERGIES:  is allergic to ace inhibitors and neosporin wound cleanser [benzalkonium chloride].  MEDICATIONS:  Current Outpatient Medications  Medication Sig Dispense Refill   aspirin 81 MG tablet Take 81 mg by mouth daily.     Cholecalciferol (VITAMIN D-3 PO) Take 1,000 Units by mouth daily.     clopidogrel (PLAVIX) 75 MG tablet TAKE 1 TABLET BY MOUTH EVERY DAY 90 tablet 1   Coenzyme Q10 (CO Q 10 PO) Take 1 capsule by mouth daily.     FARXIGA 10 MG TABS tablet TAKE 1 TABLET BY MOUTH EVERY DAY IN THE MORNING 30 tablet 11   ibuprofen (ADVIL,MOTRIN) 600 MG tablet Take 1 tablet (600 mg total) by mouth every 6 (six) hours as needed. 30 tablet 0   isosorbide mononitrate (IMDUR) 30 MG 24 hr tablet TAKE 1 TABLET BY MOUTH EVERY DAY 90 tablet 2   losartan-hydrochlorothiazide (HYZAAR) 50-12.5 MG tablet TAKE 1 TABLET BY MOUTH EVERY DAY 90 tablet 0   metFORMIN (GLUCOPHAGE) 500 MG tablet TAKE 1 TABLET BY MOUTH 2 TIMES DAILY WITH A MEAL. 180 tablet 1   metoprolol succinate (TOPROL-XL) 50 MG 24 hr tablet TAKE 1  TABLET BY MOUTH EVERY DAY 90 tablet 2   Omega-3 Fatty Acids (FISH OIL) 1200 MG CAPS Take 1 capsule by mouth daily.     pantoprazole (PROTONIX) 40 MG tablet TAKE 1 TABLET BY MOUTH EVERY DAY 90 tablet 1   rosuvastatin (CRESTOR) 40  MG tablet TAKE 1 TABLET BY MOUTH EVERY DAY 90 tablet 1   No current facility-administered medications for this visit.    Review of Systems  Constitutional:  Negative for appetite change, chills, fatigue and fever.  HENT:   Negative for hearing loss and voice change.   Eyes:  Negative for eye problems.  Respiratory:  Negative for chest tightness and cough.   Cardiovascular:  Negative for chest pain.  Gastrointestinal:  Negative for abdominal distention, abdominal pain and blood in stool.  Endocrine: Negative for hot flashes.  Genitourinary:  Negative for difficulty urinating and frequency.   Musculoskeletal:  Negative for arthralgias.  Skin:  Negative for itching and rash.  Neurological:  Negative for extremity weakness.  Hematological:  Negative for adenopathy.  Psychiatric/Behavioral:  Negative for confusion.      PHYSICAL EXAMINATION: ECOG PERFORMANCE STATUS: 0 - Asymptomatic  Vitals:   03/08/24 1526  BP: 120/74  Pulse: 70  Resp: 18  Temp: (!) 97.3 F (36.3 C)  SpO2: 95%   Filed Weights   03/08/24 1526  Weight: 165 lb (74.8 kg)    Physical Exam Constitutional:      General: She is not in acute distress.    Appearance: She is not diaphoretic.  HENT:     Head: Normocephalic and atraumatic.     Nose: Nose normal.     Mouth/Throat:     Pharynx: No oropharyngeal exudate.  Eyes:     General: No scleral icterus.    Pupils: Pupils are equal, round, and reactive to light.  Cardiovascular:     Rate and Rhythm: Normal rate and regular rhythm.     Heart sounds: No murmur heard. Pulmonary:     Effort: Pulmonary effort is normal. No respiratory distress.     Breath sounds: Normal breath sounds. No wheezing.  Abdominal:     General: There is no distension.     Palpations: Abdomen is soft.     Tenderness: There is no abdominal tenderness.  Musculoskeletal:        General: Normal range of motion.     Cervical back: Normal range of motion and neck supple.  Skin:     General: Skin is warm and dry.     Findings: No erythema.  Neurological:     Mental Status: She is alert and oriented to person, place, and time. Mental status is at baseline.     Motor: No abnormal muscle tone.  Psychiatric:        Mood and Affect: Mood and affect normal.    Breast exam was performed in seated and lying down position. Patient is status post  lest breast mass biopsy with focal tissue swelling and bruising.   No palpable breast mass in right breast   No palpable axillary adenopathy bilaterally.   LABORATORY DATA:  I have reviewed the data as listed    Latest Ref Rng & Units 03/08/2024    4:16 PM 09/08/2023    8:36 AM 03/18/2019    2:53 AM  CBC  WBC 4.0 - 10.5 K/uL 5.6  5.0  6.7   Hemoglobin 12.0 - 15.0 g/dL 16.1  09.6  04.5   Hematocrit 36.0 - 46.0 % 39.9  44.9  36.6   Platelets 150 - 400 K/uL 217  199  202       Latest Ref Rng & Units 03/08/2024    4:16 PM 02/13/2024    8:31 AM 09/08/2023    8:36 AM  CMP  Glucose 70 - 99 mg/dL 409  811  914   BUN 6 - 20 mg/dL 13  16  19    Creatinine 0.44 - 1.00 mg/dL 7.82  9.56  2.13   Sodium 135 - 145 mmol/L 137  143  143   Potassium 3.5 - 5.1 mmol/L 3.5  4.3  4.6   Chloride 98 - 111 mmol/L 101  105  106   CO2 22 - 32 mmol/L 24  22  22    Calcium 8.9 - 10.3 mg/dL 9.5  9.6  9.7   Total Protein 6.5 - 8.1 g/dL 7.3  6.7  6.7   Total Bilirubin 0.0 - 1.2 mg/dL 0.6  0.4  0.4   Alkaline Phos 38 - 126 U/L 53  65  61   AST 15 - 41 U/L 27  19  17    ALT 0 - 44 U/L 40  28  24      RADIOGRAPHIC STUDIES: I have personally reviewed the radiological images as listed and agreed with the findings in the report. [place] LT BREAST BX W LOC DEV 1ST LESION IMG BX SPEC US GUIDE Addendum Date: 03/04/2024 ADDENDUM REPORT: 03/04/2024 14:08 ADDENDUM: PATHOLOGY revealed: 1. Breast, left, needle core biopsy, 8 o'clock, 8 cmfn : - INVASIVE DUCTAL CARCINOMA, GRADE 2, EXTENDING TO 0.6 CM IN MAXIMUM EXTENT, AND INVOLVING TWO OF TWO BIOPSY FRAGMENTS. SEE COMMENT.  - OVERALL GRADE: 2 - LYMPHOVASCULAR INVASION: NOT IDENTIFIED - CANCER LENGTH: 6 MM - CALCIFICATIONS: NOT IDENTIFIED - LYMPHOVASCULAR INVASION: NOT IDENTIFIED. Pathology results are CONCORDANT with imaging findings, per Dr. Elberta Fortis. Pathology results and recommendations were discussed with patient via telephone on 03/04/2024 by Randa Lynn RN. Patient reported biopsy site doing well with no adverse symptoms, and only slight tenderness at the site. Post biopsy care instructions were reviewed, questions were answered and my direct phone number was provided. Patient was instructed to call [facility] for any additional questions or concerns related to biopsy site. RECOMMENDATIONS: 1. Surgical and oncological consultation. Request for surgical and oncological consultation relayed to Irving Shows RN at [facility] by Randa Lynn RN on 03/04/2024. Pathology results reported by Randa Lynn RN on 03/04/2024. Electronically Signed   By: Elberta Fortis M.D.   On: 03/04/2024 14:08   Result Date: 03/04/2024 CLINICAL DATA:  Patient presents for ultrasound-guided core needle biopsy of a 8 mm suspicious mass over the 8 o'clock position of the left breast. EXAM: ULTRASOUND GUIDED LEFT BREAST CORE NEEDLE BIOPSY COMPARISON:  Previous exam(s). PROCEDURE: I met with the patient and we discussed the procedure of ultrasound-guided biopsy, including benefits and alternatives. We discussed the high likelihood of a successful procedure. We discussed the risks of the procedure, including infection, bleeding, tissue injury, clip migration, and inadequate sampling. Informed written consent was given. The usual time-out protocol was performed immediately prior to the procedure. Lesion quadrant: Left inner lower quadrant. Using sterile technique and 1% Lidocaine as local anesthetic, under direct ultrasound visualization, a 12 gauge spring-loaded device was used to perform biopsy of the targeted 8 mm mass over the 8  o'clock position of the left breast using a inferior to superior approach. At the conclusion of the procedure a Venus shaped  tissue marker clip was deployed into the biopsy cavity. Follow up 2 view mammogram was performed and dictated separately. IMPRESSION: Ultrasound guided biopsy of a suspicious 8 mm left breast mass. No apparent complications. Electronically Signed: By: Elberta Fortis M.D. On: 03/03/2024 09:35   MM CLIP PLACEMENT LEFT Result Date: 03/03/2024 CLINICAL DATA:  Patient is post ultrasound-guided core needle biopsy of an 8 mm suspicious mass over the 8 o'clock position of the left breast. EXAM: 3D DIAGNOSTIC LEFT MAMMOGRAM POST ULTRASOUND BIOPSY COMPARISON:  Previous exam(s). ACR Breast Density Category c: The breasts are heterogeneously dense, which may obscure small masses. FINDINGS: 3D Mammographic images were obtained following ultrasound guided biopsy of the targeted 8 mm mass over the 8 o'clock position of the left breast. The biopsy marking clip is in expected position at the site of biopsy. IMPRESSION: Appropriate positioning of the Venus shaped biopsy marking clip at the site of biopsy in the inner lower left breast. Final Assessment: Post Procedure Mammograms for Marker Placement Electronically Signed   By: Elberta Fortis M.D.   On: 03/03/2024 09:44   MM 3D DIAGNOSTIC MAMMOGRAM UNILATERAL LEFT BREAST Result Date: 02/24/2024 CLINICAL DATA:  LEFT breast mass callback EXAM: DIGITAL DIAGNOSTIC UNILATERAL LEFT MAMMOGRAM WITH TOMOSYNTHESIS AND CAD; ULTRASOUND LEFT BREAST LIMITED TECHNIQUE: Left digital diagnostic mammography and breast tomosynthesis was performed. The images were evaluated with computer-aided detection. ; Targeted ultrasound examination of the left breast was performed. COMPARISON:  Previous exam(s). ACR Breast Density Category c: The breasts are heterogeneously dense, which may obscure small masses. FINDINGS: Spot compression tomosynthesis views demonstrate a persistent  irregular mass with subtle associated architectural distortion in the LEFT inner breast at posterior depth. This is not stable in comparison to priors. Targeted ultrasound was performed of the LEFT breast. At 8 o'clock 8 cm from the nipple, there is an irregular hypoechoic mass with irregular margins and a faint echogenic rind. This measures 8 x 5 by 6 mm. This corresponds to the mass of mammographic concern. Targeted ultrasound was performed of the LEFT axilla. No suspicious axillary lymph nodes are visualized. IMPRESSION: 1. There is an 8 mm mass at the site of screening mammographic concern which is highly suspicious for malignancy. Recommend ultrasound-guided biopsy for definitive characterization. 2. No suspicious LEFT axillary adenopathy. RECOMMENDATION: LEFT breast ultrasound-guided biopsy x1 I have discussed the findings and recommendations with the patient. The biopsy procedure was discussed with the patient and questions were answered. Patient expressed their understanding of the biopsy recommendation. Patient will be scheduled for biopsy at her earliest convenience by the schedulers. Ordering provider will be notified. If applicable, a reminder letter will be sent to the patient regarding the next appointment. BI-RADS CATEGORY  4: Suspicious. Electronically Signed   By: Meda Klinefelter M.D.   On: 02/24/2024 12:13   [place] LIMITED ULTRASOUND INCLUDING AXILLA LEFT BREAST  Result Date: 02/24/2024 CLINICAL DATA:  LEFT breast mass callback EXAM: DIGITAL DIAGNOSTIC UNILATERAL LEFT MAMMOGRAM WITH TOMOSYNTHESIS AND CAD; ULTRASOUND LEFT BREAST LIMITED TECHNIQUE: Left digital diagnostic mammography and breast tomosynthesis was performed. The images were evaluated with computer-aided detection. ; Targeted ultrasound examination of the left breast was performed. COMPARISON:  Previous exam(s). ACR Breast Density Category c: The breasts are heterogeneously dense, which may obscure small masses. FINDINGS: Spot  compression tomosynthesis views demonstrate a persistent irregular mass with subtle associated architectural distortion in the LEFT inner breast at posterior depth. This is not stable in comparison to priors. Targeted ultrasound was performed of the LEFT breast. At  8 o'clock 8 cm from the nipple, there is an irregular hypoechoic mass with irregular margins and a faint echogenic rind. This measures 8 x 5 by 6 mm. This corresponds to the mass of mammographic concern. Targeted ultrasound was performed of the LEFT axilla. No suspicious axillary lymph nodes are visualized. IMPRESSION: 1. There is an 8 mm mass at the site of screening mammographic concern which is highly suspicious for malignancy. Recommend ultrasound-guided biopsy for definitive characterization. 2. No suspicious LEFT axillary adenopathy. RECOMMENDATION: LEFT breast ultrasound-guided biopsy x1 I have discussed the findings and recommendations with the patient. The biopsy procedure was discussed with the patient and questions were answered. Patient expressed their understanding of the biopsy recommendation. Patient will be scheduled for biopsy at her earliest convenience by the schedulers. Ordering provider will be notified. If applicable, a reminder letter will be sent to the patient regarding the next appointment. BI-RADS CATEGORY  4: Suspicious. Electronically Signed   By: Meda Klinefelter M.D.   On: 02/24/2024 12:13   MM 3D SCREENING MAMMOGRAM BILATERAL BREAST Result Date: 02/17/2024 CLINICAL DATA:  Screening. EXAM: DIGITAL SCREENING BILATERAL MAMMOGRAM WITH TOMOSYNTHESIS AND CAD TECHNIQUE: Bilateral screening digital craniocaudal and mediolateral oblique mammograms were obtained. Bilateral screening digital breast tomosynthesis was performed. The images were evaluated with computer-aided detection. COMPARISON:  Previous exam(s). ACR Breast Density Category c: The breasts are heterogeneously dense, which may obscure small masses. FINDINGS: In the  left breast, a possible mass warrants further evaluation. In the right breast, no findings suspicious for malignancy. IMPRESSION: Further evaluation is suggested for a possible mass in the left breast. RECOMMENDATION: Diagnostic mammogram and possibly ultrasound of the left breast. (Code:[id number]-L-[id number]) The patient will be contacted regarding the findings, and additional imaging will be scheduled. BI-RADS CATEGORY  0: Incomplete: Need additional imaging evaluation. Electronically Signed   By: Edwin Cap M.D.   On: 02/17/2024 14:20

## 2024-03-10 ENCOUNTER — Other Ambulatory Visit: Payer: Self-pay | Admitting: General Surgery

## 2024-03-10 DIAGNOSIS — C50312 Malignant neoplasm of lower-inner quadrant of left female breast: Secondary | ICD-10-CM

## 2024-03-11 ENCOUNTER — Ambulatory Visit
Admission: RE | Admit: 2024-03-11 | Discharge: 2024-03-11 | Disposition: A | Discharge status: Home / Self Care | Source: Ambulatory Visit | Attending: General Surgery | Admitting: General Surgery

## 2024-03-11 ENCOUNTER — Inpatient Hospital Stay: Admission: RE | Admit: 2024-03-11 | Source: Ambulatory Visit

## 2024-03-11 DIAGNOSIS — C50312 Malignant neoplasm of lower-inner quadrant of left female breast: Secondary | ICD-10-CM | POA: Insufficient documentation

## 2024-03-11 DIAGNOSIS — Z170 Estrogen receptor positive status [ER+]: Secondary | ICD-10-CM

## 2024-03-11 HISTORY — PX: BREAST BIOPSY: SHX20

## 2024-03-11 MED ORDER — LIDOCAINE HCL 1 % IJ SOLN
5.0000 mL | Freq: Once | INTRAMUSCULAR | Status: AC
  Administered 2024-03-11: 5 mL

## 2024-03-12 ENCOUNTER — Other Ambulatory Visit: Payer: Self-pay

## 2024-03-12 ENCOUNTER — Encounter
Admission: RE | Admit: 2024-03-12 | Discharge: 2024-03-12 | Disposition: A | Discharge status: Home / Self Care | Source: Ambulatory Visit | Attending: General Surgery | Admitting: General Surgery

## 2024-03-12 DIAGNOSIS — Z01812 Encounter for preprocedural laboratory examination: Secondary | ICD-10-CM

## 2024-03-12 DIAGNOSIS — E119 Type 2 diabetes mellitus without complications: Secondary | ICD-10-CM

## 2024-03-12 HISTORY — DX: Unstable angina: I20.0

## 2024-03-12 HISTORY — DX: Malignant (primary) neoplasm, unspecified: C80.1

## 2024-03-12 HISTORY — DX: Type 2 diabetes mellitus without complications: E11.9

## 2024-03-12 HISTORY — DX: Other complications of anesthesia, initial encounter: T88.59XA

## 2024-03-12 NOTE — Patient Instructions (Addendum)
 Your procedure is scheduled on: 03/17/24 - Wednesday Report to the Registration Desk on the 1st floor of the [facility]. To find out your arrival time, please call [phone] between 1PM - 3PM on: 03/16/24 - Monday If your arrival time is 6:00 am, do not arrive before that time as the [facility] entrance doors do not open until 6:00 am.  REMEMBER: Instructions that are not followed completely may result in serious medical risk, up to and including death; or upon the discretion of your surgeon and anesthesiologist your surgery may need to be rescheduled.  Do not eat food or drink any liquids after midnight the night before surgery.  No gum chewing or hard candies.  One week prior to surgery: Stop Anti-inflammatories (NSAIDS) such as Advil, Aleve, Ibuprofen, Motrin, Naproxen, Naprosyn and Aspirin based products such as Excedrin, Goody's Powder, BC Powder. You may take Tylenol if needed for pain up until the day of surgery.  Stop ANY OVER THE COUNTER supplements until after surgery : Coenzyme Q10 , Omega-3 Fatty Acids ,  HOLD aspirin and clopidogrel (PLAVIX) beginning 03/12/24.  HOLD Metformin beginning 03/15/24.  HOLD Farxiga beginning 03/14/24.  HOLD losartan-hydrochlorothiazide on the day    ON THE DAY OF SURGERY ONLY TAKE THESE MEDICATIONS WITH SIPS OF WATER:  isosorbide mononitrate (IMDUR)  metoprolol succinate (TOPROL-XL)  pantoprazole (PROTONIX)     No Alcohol for 24 hours before or after surgery.  No Smoking including e-cigarettes for 24 hours before surgery.  No chewable tobacco products for at least 6 hours before surgery.  No nicotine patches on the day of surgery.  Do not use any "recreational" drugs for at least a week (preferably 2 weeks) before your surgery.  Please be advised that the combination of cocaine and anesthesia may have negative outcomes, up to and including death. If you test positive for cocaine, your surgery will be cancelled.  On the  morning of surgery brush your teeth with toothpaste and water, you may rinse your mouth with mouthwash if you wish. Do not swallow any toothpaste or mouthwash.  Use CHG Soap or wipes as directed on instruction sheet.  Do not wear jewelry, make-up, hairpins, clips or nail polish.  For welded (permanent) jewelry: bracelets, anklets, waist bands, etc.  Please have this removed prior to surgery.  If it is not removed, there is a chance that hospital personnel will need to cut it off on the day of surgery.  Do not wear lotions, powders, or perfumes.   Do not shave body hair from the neck down 48 hours before surgery.  Contact lenses, hearing aids and dentures may not be worn into surgery.  Do not bring valuables to the hospital. [facility] is not responsible for any missing/lost belongings or valuables.   Notify your doctor if there is any change in your medical condition (cold, fever, infection).  Wear comfortable clothing (specific to your surgery type) to the hospital.  After surgery, you can help prevent lung complications by doing breathing exercises.  Take deep breaths and cough every 1-2 hours. Your doctor may order a device called an Incentive Spirometer to help you take deep breaths.  When coughing or sneezing, hold a pillow firmly against your incision with both hands. This is called "splinting." Doing this helps protect your incision. It also decreases belly discomfort.  If you are being admitted to the hospital overnight, leave your suitcase in the car. After surgery it may be brought to your room.  In case  of increased patient census, it may be necessary for you, the patient, to continue your postoperative care in the Same Day Surgery department.  If you are being discharged the day of surgery, you will not be allowed to drive home. You will need a responsible individual to drive you home and stay with you for 24 hours after surgery.   If you are taking public  transportation, you will need to have a responsible individual with you.  Please call the Pre-admissions Testing Dept. at [phone] if you have any questions about these instructions.  Surgery Visitation Policy:  Patients having surgery or a procedure may have two visitors.  Children under the age of 55 must have an adult with them who is not the patient.  Inpatient Visitation:    Visiting hours are 7 a.m. to 8 p.m. Up to four visitors are allowed at one time in a patient room. The visitors may rotate out with other people during the day.  One visitor age 45 or older may stay with the patient overnight and must be in the room by 8 p.m.    Preparing for Surgery with CHLORHEXIDINE GLUCONATE (CHG) Soap  Chlorhexidine Gluconate (CHG) Soap  o An antiseptic cleaner that kills germs and bonds with the skin to continue killing germs even after washing  o Used for showering the night before surgery and morning of surgery  Before surgery, you can play an important role by reducing the number of germs on your skin.  CHG (Chlorhexidine gluconate) soap is an antiseptic cleanser which kills germs and bonds with the skin to continue killing germs even after washing.  Please do not use if you have an allergy to CHG or antibacterial soaps. If your skin becomes reddened/irritated stop using the CHG.  1. Shower the NIGHT BEFORE SURGERY and the MORNING OF SURGERY with CHG soap.  2. If you choose to wash your hair, wash your hair first as usual with your normal shampoo.  3. After shampooing, rinse your hair and body thoroughly to remove the shampoo.  4. Use CHG as you would any other liquid soap. You can apply CHG directly to the skin and wash gently with a scrungie or a clean washcloth.  5. Apply the CHG soap to your body only from the neck down. Do not use on open wounds or open sores. Avoid contact with your eyes, ears, mouth, and genitals (private parts). Wash face and genitals (private  parts) with your normal soap.  6. Wash thoroughly, paying special attention to the area where your surgery will be performed.  7. Thoroughly rinse your body with warm water.  8. Do not shower/wash with your normal soap after using and rinsing off the CHG soap.  9. Pat yourself dry with a clean towel.  10. Wear clean pajamas to bed the night before surgery.  12. Place clean sheets on your bed the night of your first shower and do not sleep with pets.  13. Shower again with the CHG soap on the day of surgery prior to arriving at the hospital.  14. Do not apply any deodorants/lotions/powders.  15. Please wear clean clothes to the hospital.

## 2024-03-15 ENCOUNTER — Encounter: Payer: Self-pay | Admitting: General Surgery

## 2024-03-15 NOTE — Progress Notes (Signed)
 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 Clinical nursing staff has updated patient's PMH/PSHx, current medication list, and drug allergies/intolerances to ensure comprehensive history available to assist in medical decision making as it pertains to the aforementioned surgical procedure and anticipated anesthetic course. Extensive review of available clinical information personally performed. [place] [facility] and PSHx updated with any diagnoses/procedures that  may have been inadvertently omitted during his intake with the pre-admission testing department's nursing staff.  Clinical Discussion:  Zoe [name] is a 58 y.o. female who is submitted for pre-surgical anesthesia review and clearance prior to her undergoing the above procedure. Patient has never been a smoker in the past.  Pertinent PMH includes: CAD, NSTEMI,, spontaneous coronary artery dissection, unstable angina, HTN, HLD, T2DM, GERD (on daily PPI), asthma, LEFT breast cancer.  Patient is followed by cardiology Welton Flakes, MD). She was last seen in the cardiology clinic on 10/31/2023; notes reviewed. At the time of her clinic visit, patient doing well overall from a cardiovascular perspective. Patient denied any chest pain, shortness of breath, PND,  orthopnea, palpitations, significant peripheral edema, weakness, fatigue, vertiginous symptoms, or presyncope/syncope. Patient with a past medical history significant for cardiovascular diagnoses. Documented physical exam was grossly benign, providing no evidence of acute exacerbation and/or decompensation of the patient's known cardiovascular conditions.  Coronary CTA was performed on 03/15/2019 that demonstrated an Agatston coronary artery calcium score of 0. Study demonstrates normal coronary origin with RIGHT dominance.  Patient suffered an NSTEMI on 03/16/2019.  Troponins were trended: 2.46 --> 1.98 --> 2.42 --> 1.53 --> 1.16 ng.mL.  Patient was taken for diagnostic LEFT heart catheterization which revealed a single 80% stenosis of the ostial OM1-OM1.  Findings were discussed with interventional cardiology and the decision was made to defer intervention as finding likely represented spontaneous OM1 dissection given the fact that her coronary calcium score was 0 the day prior.  Medical management was recommended.  Given patient's history of coronary artery disease and NSTEMI, she remains on daily DAPT therapy using ASA + clopidogrel.  Patient reported to be compliant with therapy with no evidence or reports of GI/GU related bleeding.  Blood pressure well controlled at 126/90 mmHg on currently prescribed nitrate (isosorbide mononitrate, ARB (losartan), and diuretic (HCTZ) therapies.  Patient is on rosuvastatin for her HLD diagnosis and ASCVD prevention. T2DM reasonably controlled on currently prescribed regimen; last HgbA1c was 7.2% when checked on 09/08/2023. In the setting of known cardiovascular diagnoses and concurrent T2DM, patient is taking an SGLT2i (dapagliflozin) for added cardiovascular and renovascular protection.  Patient does not have an OSAH diagnosis. Patient is able to complete all of her  ADL/IADLs without cardiovascular limitation.  Per the DASI, patient is able to achieve at least 4 METS  of physical activity without experiencing any significant degree of angina/anginal equivalent symptoms. No changes were made  to her medication regimen during her visit with cardiology. Patient scheduled to follow-up with outpatient cardiology in 5 months or sooner if needed.  Zoe [name] recently had an abnormal mammogram that revealed a mass in her LEFT breast.  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  Immunohistochemical testing demonstrated that tumor was ER/PR (+) and HER2/neu (-) compatible with stage IA disease (cT1b cN0 M0).  Patient was referred to general surgery for consultation and discussions regarding surgical excision.  Following consult with general surgery, patient has elected to pursue partial resection via lumpectomy.  Patient has been scheduled for elective LEFT BREAST LUMPECTOMY WITH SENTINEL NODE BIOPSY on 03/17/2024 with Dr. Carolan Shiver, MD.   Given patient's past medical history significant for cardiovascular diagnoses, presurgical cardiac clearance was sought by the PAT team. Per cardiology, "this patient is optimized for surgery and may proceed with the planned procedural course with a LOW risk of significant perioperative cardiovascular complications".  Again, this patient is on daily DAPT therapy.  She has been started on recommendations from cardiology for holding her DAPT medications for 5 days prior to her procedure with plans to restart as soon as postoperative bleeding risk felt to be minimized by her primary attending surgeon.  The patient is aware that her last doses of her clopidogrel and low-dose ASA should be on 03/11/2024.  Patient reports previous perioperative complications with anesthesia in the past.  Patient with a past medical history significant for (+) delayed/prolonged emergence from anesthesia.  In review her EMR, it is noted that patient underwent a  general anesthetic course here at [facility] (ASA III) in 10/2019 without documented complications.      03/08/2024    3:26 PM 02/26/2024    3:21 PM 02/05/2024    2:49 PM  Vitals with BMI  Height  5\' 4"    Weight 165 lbs 162 lbs   BMI 28.31 27.79   Systolic 120 124 161  Diastolic 74 80 92  Pulse 70 69 77   Providers/Specialists:  NOTE: Primary physician provider listed below. Patient may have been seen by APP or partner within same practice.   PROVIDER ROLE / SPECIALTY LAST Beverely Pace, MD General Surgery (Surgeon) 03/09/2024  Marisue Ivan, NP Primary Care Provider 02/26/2024  Adrian Blackwater, MD Cardiology 10/30/2023  Rickard Patience, MD Medical Oncology 03/08/2024   Allergies:   Allergies  Allergen Reactions   Ace Inhibitors Cough    Patient cannot recall the specific ACE inhibitor that caused her cough.   Neosporin Wound Cleanser [Benzalkonium Chloride] Rash and Other (See Comments)    Swelling / redness   Current Home Medications:   No current facility-administered medications for this encounter.    aspirin 81 MG tablet   Cholecalciferol (VITAMIN D-3 PO)   clopidogrel (PLAVIX) 75 MG tablet   Coenzyme Q10 (CO Q 10 PO)   FARXIGA 10 MG TABS tablet   ibuprofen (ADVIL,MOTRIN) 600 MG tablet   isosorbide mononitrate (IMDUR) 30 MG 24 hr tablet   losartan-hydrochlorothiazide (HYZAAR) 50-12.5 MG tablet   metFORMIN (GLUCOPHAGE) 500 MG tablet   metoprolol succinate (TOPROL-XL) 50 MG 24 hr tablet   Omega-3 Fatty Acids (FISH OIL) 1200 MG CAPS   pantoprazole (PROTONIX) 40 MG tablet   rosuvastatin (CRESTOR) 40 MG tablet   History:   Past Medical History:  Diagnosis Date   Allergic rhinitis    Asthma    Complication  of anesthesia    a.) delayed/prolonged emergence   Coronary artery disease    Dysplastic nevi 08/30/2020   Right lateral distal deltoid, left lower back post waistline. 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   T2DM (type 2 diabetes mellitus) (HCC)    Unstable angina [facility])    Past Surgical History:  Procedure Laterality Date   BREAST BIOPSY Left 03/03/2024   [place] LT BREAST Bx venus marker,   8:00 8 cmfn : - INVASIVE DUCTAL CARCINOMA   BREAST BIOPSY Left 03/11/2024   [place] LT RADIO FREQUENCY TAG LOC US GUIDE 03/11/2024 [facility]   CARDIAC CATHETERIZATION     COLONOSCOPY  2001   COLONOSCOPY WITH PROPOFOL N/A 11/05/2019   Procedure: COLONOSCOPY WITH PROPOFOL;  Surgeon: Pasty Spillers, MD;  Location: [facility] ENDOSCOPY;  Service: Endoscopy;  Laterality: N/A;   CORONARY ANGIOPLASTY     DILATATION & CURETTAGE/HYSTEROSCOPY WITH MYOSURE N/A 02/29/2016   Procedure: DILATATION & CURETTAGE/HYSTEROSCOPY WITH MYOSURE;  Surgeon: Elenora Fender Ward, MD;  Location: [facility] ORS;  Service: Gynecology;  Laterality: N/A;   DILATION AND CURETTAGE OF UTERUS     EAR CYST EXCISION Left 2015   IN OFFICE   ESOPHAGOGASTRODUODENOSCOPY     INTRAUTERINE DEVICE (IUD) INSERTION N/A 02/29/2016   Procedure: INTRAUTERINE DEVICE (IUD) INSERTION;  Surgeon: Elenora Fender Ward, MD;  Location: [facility] ORS;  Service: Gynecology;  Laterality: N/A;   LEFT HEART CATH AND CORONARY ANGIOGRAPHY Left 03/16/2019   Procedure: LEFT HEART CATH AND CORONARY ANGIOGRAPHY;  Surgeon: Laurier Nancy, MD;  Location: [facility] INVASIVE CV LAB;  Service: Cardiovascular;  Laterality: Left;   Family  History  Problem Relation Age of Onset   Hyperlipidemia Mother    Hypertension Mother    Heart disease Father        died from either MI or stroke   Diabetes Brother        TYPE 2   Heart disease Paternal Grandfather    Diabetes Other        TYPE 2   Cancer Neg Hx    Breast cancer Neg Hx    Social History   Tobacco Use   Smoking status: Never   Smokeless tobacco: Never  Substance Use Topics   Alcohol use: Yes    Alcohol/week: 3.0 - 5.0 standard drinks of alcohol    Types: 3 - 5 Cans of beer per week    Comment: occasionally   Pertinent Clinical Results:  LABS:  Lab Results  Component Value Date   WBC 5.6 03/08/2024   HGB 13.5 03/08/2024   HCT 39.9 03/08/2024   MCV 81.1 03/08/2024   PLT 217 03/08/2024   Lab Results  Component Value Date   NA 137 03/08/2024   CL 101 03/08/2024   K 3.5 03/08/2024   CO2 24 03/08/2024   BUN 13 03/08/2024   CREATININE 0.63 03/08/2024   GFRNONAA >60  03/08/2024   CALCIUM 9.5 03/08/2024   ALBUMIN 4.6 03/08/2024   GLUCOSE 111 (H) 03/08/2024    ECG: Date: 03/16/2024  Time ECG obtained: 1010 AM Rate: 64 bpm Rhythm: normal sinus Axis (leads I and aVF): normal Intervals: PR 156 ms. QRS 76 ms. QTc 404 ms. ST segment and T wave changes: No evidence of acute T wave abnormalities or significant ST segment elevation or depression.  Evidence of a possible, age undetermined, prior infarct:  No Comparison: No prior ECG tracings on file for review/comparison.    IMAGING / PROCEDURES: LEFT HEART CATHETERIZATION AND CORONARY ANGIOGRAPHY performed on 03/16/2019 Normal left ventricular systolic function with an EF of 60% Single-vessel CAD with an 80% ostial OM1-OM1 lesion LM, LAD, LCx, and RCA all normal Decision made to treat medically as supplied small felt to be due to spontaneous dissection since calcium score was 0 yesterday in the office.   CT CORONARY MORPH W/CTA COR W/SCORE W/CA W/CM &/OR WO/CM performed on 03/15/2019 Coronary  calcium score 0 No evidence of atherosclerotic calcifications  Impression and Plan:  Zoe [name] has been referred for pre-anesthesia review and clearance prior to her undergoing the planned anesthetic and procedural courses. Available labs, pertinent testing, and imaging results were personally reviewed by me in preparation for upcoming operative/procedural course. [facility] medical record has been updated following extensive record review and patient interview with PAT staff.   This patient has been appropriately cleared by cardiology with an overall LOW risk of experiencing significant perioperative cardiovascular complications. Based on clinical review performed today (03/16/24), barring any significant acute changes in the patient's overall condition, it is anticipated that she will be able to proceed with the planned surgical intervention. Any acute changes in clinical condition may necessitate her procedure being postponed and/or cancelled. Patient will meet with anesthesia team (MD and/or CRNA) on the day of her procedure for preoperative evaluation/assessment. Questions regarding anesthetic course will be fielded at that time.   Pre-surgical instructions were reviewed with the patient during his PAT appointment, and questions were fielded to satisfaction by PAT clinical staff. She has been instructed on which medications that she will need to hold prior to surgery, as well as the ones that have been deemed safe/appropriate to take on the day of her procedure. As part of the general education provided by PAT, patient made aware both verbally and in writing, that she would need to abstain from the use of any illegal substances during her perioperative course. She was advised that failure to follow the provided instructions could necessitate case cancellation or result in serious perioperative complications up to and including death. Patient encouraged to contact PAT and/or her surgeon's office  to discuss any questions or concerns that may arise prior to surgery; verbalized understanding.   Quentin Mulling, MSN, APRN, FNP-C, CEN [facility]  Perioperative Services Nurse Practitioner Phone: [phone] Fax: [phone] 03/16/24 11:35 AM  NOTE: This note has been prepared using Dragon dictation software. Despite my best ability to proofread, there is always the potential that unintentional transcriptional errors may still occur from this process.

## 2024-03-16 ENCOUNTER — Encounter
Admission: RE | Admit: 2024-03-16 | Discharge: 2024-03-16 | Disposition: A | Discharge status: Home / Self Care | Source: Ambulatory Visit | Attending: General Surgery | Admitting: General Surgery

## 2024-03-16 ENCOUNTER — Encounter: Payer: Self-pay | Admitting: *Deleted

## 2024-03-16 DIAGNOSIS — Z01812 Encounter for preprocedural laboratory examination: Secondary | ICD-10-CM

## 2024-03-16 DIAGNOSIS — Z01810 Encounter for preprocedural cardiovascular examination: Secondary | ICD-10-CM | POA: Insufficient documentation

## 2024-03-16 NOTE — Progress Notes (Signed)
 Ms. Schiano called to clarify her appointments for tomorrow.  Appt details given.

## 2024-03-17 ENCOUNTER — Ambulatory Visit: Admitting: Occupational Therapy

## 2024-03-17 ENCOUNTER — Ambulatory Visit
Admission: RE | Admit: 2024-03-17 | Discharge: 2024-03-17 | Disposition: A | Discharge status: Home / Self Care | Source: Ambulatory Visit | Attending: General Surgery | Admitting: General Surgery

## 2024-03-17 ENCOUNTER — Encounter
Admission: RE | Disposition: A | Discharge status: Home / Self Care | Payer: Self-pay | Source: Home / Self Care | Attending: General Surgery

## 2024-03-17 ENCOUNTER — Other Ambulatory Visit: Payer: Self-pay

## 2024-03-17 ENCOUNTER — Encounter: Payer: Self-pay | Admitting: General Surgery

## 2024-03-17 ENCOUNTER — Encounter
Admission: RE | Admit: 2024-03-17 | Discharge: 2024-03-17 | Disposition: A | Discharge status: Home / Self Care | Source: Ambulatory Visit | Attending: General Surgery | Admitting: General Surgery

## 2024-03-17 ENCOUNTER — Ambulatory Visit: Payer: Self-pay | Admitting: Urgent Care

## 2024-03-17 ENCOUNTER — Ambulatory Visit
Admission: RE | Admit: 2024-03-17 | Discharge: 2024-03-17 | Disposition: A | Discharge status: Home / Self Care | Attending: General Surgery | Admitting: General Surgery

## 2024-03-17 DIAGNOSIS — K219 Gastro-esophageal reflux disease without esophagitis: Secondary | ICD-10-CM | POA: Diagnosis not present

## 2024-03-17 DIAGNOSIS — Z803 Family history of malignant neoplasm of breast: Secondary | ICD-10-CM | POA: Diagnosis not present

## 2024-03-17 DIAGNOSIS — I252 Old myocardial infarction: Secondary | ICD-10-CM | POA: Insufficient documentation

## 2024-03-17 DIAGNOSIS — E7800 Pure hypercholesterolemia, unspecified: Secondary | ICD-10-CM | POA: Insufficient documentation

## 2024-03-17 DIAGNOSIS — Z170 Estrogen receptor positive status [ER+]: Secondary | ICD-10-CM | POA: Insufficient documentation

## 2024-03-17 DIAGNOSIS — Z1732 Human epidermal growth factor receptor 2 negative status: Secondary | ICD-10-CM | POA: Diagnosis not present

## 2024-03-17 DIAGNOSIS — I2510 Atherosclerotic heart disease of native coronary artery without angina pectoris: Secondary | ICD-10-CM | POA: Insufficient documentation

## 2024-03-17 DIAGNOSIS — Z79899 Other long term (current) drug therapy: Secondary | ICD-10-CM | POA: Diagnosis not present

## 2024-03-17 DIAGNOSIS — Z01812 Encounter for preprocedural laboratory examination: Secondary | ICD-10-CM

## 2024-03-17 DIAGNOSIS — I10 Essential (primary) hypertension: Secondary | ICD-10-CM | POA: Diagnosis not present

## 2024-03-17 DIAGNOSIS — Z1721 Progesterone receptor positive status: Secondary | ICD-10-CM | POA: Diagnosis not present

## 2024-03-17 DIAGNOSIS — J45909 Unspecified asthma, uncomplicated: Secondary | ICD-10-CM | POA: Diagnosis not present

## 2024-03-17 DIAGNOSIS — C50312 Malignant neoplasm of lower-inner quadrant of left female breast: Secondary | ICD-10-CM | POA: Insufficient documentation

## 2024-03-17 DIAGNOSIS — E119 Type 2 diabetes mellitus without complications: Secondary | ICD-10-CM | POA: Diagnosis not present

## 2024-03-17 HISTORY — DX: Long term (current) use of antithrombotics/antiplatelets: Z79.02

## 2024-03-17 HISTORY — DX: Long term (current) use of aspirin: Z79.82

## 2024-03-17 HISTORY — PX: AXILLARY SENTINEL NODE BIOPSY: SHX5738

## 2024-03-17 HISTORY — PX: BREAST LUMPECTOMY: SHX2

## 2024-03-17 LAB — POCT PREGNANCY, URINE: Preg Test, Ur: NEGATIVE

## 2024-03-17 LAB — GLUCOSE, CAPILLARY
Glucose-Capillary: 137 mg/dL — ABNORMAL HIGH (ref 70–99)
Glucose-Capillary: 147 mg/dL — ABNORMAL HIGH (ref 70–99)

## 2024-03-17 SURGERY — BREAST LUMPECTOMY
Anesthesia: General | Site: Breast | Laterality: Left

## 2024-03-17 MED ORDER — MIDAZOLAM HCL 2 MG/2ML IJ SOLN
INTRAMUSCULAR | Status: AC
  Filled 2024-03-17: qty 2

## 2024-03-17 MED ORDER — MIDAZOLAM HCL 2 MG/2ML IJ SOLN
INTRAMUSCULAR | Status: DC | PRN
  Administered 2024-03-17: 2 mg via INTRAVENOUS

## 2024-03-17 MED ORDER — CHLORHEXIDINE GLUCONATE 0.12 % MT SOLN
OROMUCOSAL | Status: AC
  Filled 2024-03-17: qty 15

## 2024-03-17 MED ORDER — STERILE WATER FOR IRRIGATION IR SOLN
Status: DC | PRN
  Administered 2024-03-17: 500 mL

## 2024-03-17 MED ORDER — FENTANYL CITRATE (PF) 100 MCG/2ML IJ SOLN: 25.0000 ug | INTRAMUSCULAR | Status: DC | PRN

## 2024-03-17 MED ORDER — SODIUM CHLORIDE 0.9% FLUSH: 3.0000 mL | INTRAVENOUS | Status: DC | PRN

## 2024-03-17 MED ORDER — CHLORHEXIDINE GLUCONATE 0.12 % MT SOLN
15.0000 mL | Freq: Once | OROMUCOSAL | Status: AC
  Administered 2024-03-17: 15 mL via OROMUCOSAL

## 2024-03-17 MED ORDER — PROPOFOL 10 MG/ML IV BOLUS
INTRAVENOUS | Status: DC | PRN
  Administered 2024-03-17: 200 mg via INTRAVENOUS

## 2024-03-17 MED ORDER — ACETAMINOPHEN 10 MG/ML IV SOLN
INTRAVENOUS | Status: AC
  Filled 2024-03-17: qty 100

## 2024-03-17 MED ORDER — BUPIVACAINE-EPINEPHRINE (PF) 0.5% -1:200000 IJ SOLN
INTRAMUSCULAR | Status: AC
  Filled 2024-03-17: qty 30

## 2024-03-17 MED ORDER — METHYLENE BLUE (ANTIDOTE) 1 % IV SOLN
INTRAVENOUS | Status: AC
  Filled 2024-03-17: qty 10

## 2024-03-17 MED ORDER — TECHNETIUM TC 99M TILMANOCEPT KIT
1.1300 | PACK | Freq: Once | INTRAVENOUS | Status: AC | PRN
  Administered 2024-03-17: 1.13 via INTRADERMAL

## 2024-03-17 MED ORDER — CEFAZOLIN SODIUM-DEXTROSE 2-4 GM/100ML-% IV SOLN
2.0000 g | INTRAVENOUS | Status: DC
Start: 2024-03-17 — End: 2024-03-17

## 2024-03-17 MED ORDER — DEXMEDETOMIDINE HCL IN NACL 200 MCG/50ML IV SOLN
INTRAVENOUS | Status: DC | PRN
  Administered 2024-03-17: 12 ug via INTRAVENOUS

## 2024-03-17 MED ORDER — GLYCOPYRROLATE 0.2 MG/ML IJ SOLN
INTRAMUSCULAR | Status: DC | PRN
  Administered 2024-03-17: .2 mg via INTRAVENOUS

## 2024-03-17 MED ORDER — ACETAMINOPHEN 10 MG/ML IV SOLN
INTRAVENOUS | Status: DC | PRN
  Administered 2024-03-17: 1000 mg via INTRAVENOUS

## 2024-03-17 MED ORDER — PHENYLEPHRINE HCL-NACL 20-0.9 MG/250ML-% IV SOLN
INTRAVENOUS | Status: DC | PRN
  Administered 2024-03-17: 20 ug/min via INTRAVENOUS

## 2024-03-17 MED ORDER — DROPERIDOL 2.5 MG/ML IJ SOLN: 0.6250 mg | Freq: Once | INTRAMUSCULAR | Status: DC | PRN

## 2024-03-17 MED ORDER — BUPIVACAINE-EPINEPHRINE (PF) 0.5% -1:200000 IJ SOLN
INTRAMUSCULAR | Status: DC | PRN
  Administered 2024-03-17: 30 mL

## 2024-03-17 MED ORDER — PROPOFOL 1000 MG/100ML IV EMUL
INTRAVENOUS | Status: AC
  Filled 2024-03-17: qty 100

## 2024-03-17 MED ORDER — FENTANYL CITRATE (PF) 100 MCG/2ML IJ SOLN
INTRAMUSCULAR | Status: AC
  Filled 2024-03-17: qty 2

## 2024-03-17 MED ORDER — PHENYLEPHRINE 80 MCG/ML (10ML) SYRINGE FOR IV PUSH (FOR BLOOD PRESSURE SUPPORT)
PREFILLED_SYRINGE | INTRAVENOUS | Status: DC | PRN
  Administered 2024-03-17: 80 ug via INTRAVENOUS

## 2024-03-17 MED ORDER — CEFAZOLIN SODIUM-DEXTROSE 2-4 GM/100ML-% IV SOLN
INTRAVENOUS | Status: AC
  Filled 2024-03-17: qty 100

## 2024-03-17 MED ORDER — ORAL CARE MOUTH RINSE: 15.0000 mL | Freq: Once | OROMUCOSAL | Status: AC

## 2024-03-17 MED ORDER — SODIUM CHLORIDE 0.9% FLUSH: 3.0000 mL | Freq: Two times a day (BID) | INTRAVENOUS | Status: DC

## 2024-03-17 MED ORDER — PHENYLEPHRINE HCL-NACL 20-0.9 MG/250ML-% IV SOLN
INTRAVENOUS | Status: AC
  Filled 2024-03-17: qty 250

## 2024-03-17 MED ORDER — LACTATED RINGERS IV SOLN
INTRAVENOUS | Status: DC | PRN
Start: 2024-03-17 — End: 2024-03-17

## 2024-03-17 MED ORDER — FENTANYL CITRATE (PF) 100 MCG/2ML IJ SOLN
INTRAMUSCULAR | Status: DC | PRN
  Administered 2024-03-17 (×2): 25 ug via INTRAVENOUS

## 2024-03-17 MED ORDER — HYDROCODONE-ACETAMINOPHEN 5-325 MG PO TABS: 1.0000 | ORAL_TABLET | Freq: Four times a day (QID) | ORAL | 0 refills | Status: AC | PRN

## 2024-03-17 MED ORDER — DEXAMETHASONE SODIUM PHOSPHATE 10 MG/ML IJ SOLN
INTRAMUSCULAR | Status: DC | PRN
  Administered 2024-03-17: 10 mg via INTRAVENOUS

## 2024-03-17 MED ORDER — LIDOCAINE HCL (CARDIAC) PF 100 MG/5ML IV SOSY
PREFILLED_SYRINGE | INTRAVENOUS | Status: DC | PRN
  Administered 2024-03-17: 100 mg via INTRAVENOUS

## 2024-03-17 MED ORDER — ONDANSETRON HCL 4 MG/2ML IJ SOLN
INTRAMUSCULAR | Status: DC | PRN
Start: 2024-03-17 — End: 2024-03-17
  Administered 2024-03-17 (×2): 4 mg via INTRAVENOUS

## 2024-03-17 SURGICAL SUPPLY — 33 items
CHLORAPREP W/TINT 26 (MISCELLANEOUS) IMPLANT
COVER PROBE GAMMA FINDER SLV (MISCELLANEOUS) ×1 IMPLANT
DERMABOND ADVANCED .7 DNX12 (GAUZE/BANDAGES/DRESSINGS) ×1 IMPLANT
DEVICE DUBIN SPECIMEN MAMMOGRA (MISCELLANEOUS) ×1 IMPLANT
DRAPE LAPAROTOMY TRNSV 106X77 (MISCELLANEOUS) ×1 IMPLANT
ELECT CAUTERY BLADE TIP 2.5 (TIP) ×1 IMPLANT
ELECT REM PT RETURN 9FT ADLT (ELECTROSURGICAL) ×1 IMPLANT
ELECTRODE CAUTERY BLDE TIP 2.5 (TIP) ×1 IMPLANT
ELECTRODE REM PT RTRN 9FT ADLT (ELECTROSURGICAL) ×1 IMPLANT
GLOVE BIO SURGEON STRL SZ 6.5 (GLOVE) ×1 IMPLANT
GLOVE BIOGEL PI IND STRL 6.5 (GLOVE) ×1 IMPLANT
GOWN STRL REUS W/ TWL LRG LVL3 (GOWN DISPOSABLE) ×3 IMPLANT
KIT MARKER MARGIN INK (KITS) IMPLANT
KIT TURNOVER KIT A (KITS) ×1 IMPLANT
LABEL OR SOLS (LABEL) ×1 IMPLANT
MANIFOLD NEPTUNE II (INSTRUMENTS) ×1 IMPLANT
MARKER MARGIN CORRECT CLIP (MARKER) IMPLANT
NDL HYPO 22X1.5 SAFETY MO (MISCELLANEOUS) ×1 IMPLANT
NDL SAFETY ECLIPSE 18X1.5 (NEEDLE) IMPLANT
NEEDLE HYPO 22X1.5 SAFETY MO (MISCELLANEOUS) ×1 IMPLANT
PACK BASIN MINOR ARMC (MISCELLANEOUS) ×1 IMPLANT
RETRACTOR RING XSMALL (MISCELLANEOUS) IMPLANT
RTRCTR WOUND ALEXIS 13CM XS SH (MISCELLANEOUS) IMPLANT
SUT MNCRL 4-0 27 PS-2 XMFL (SUTURE) ×1 IMPLANT
SUT SILK 2 0 SH (SUTURE) IMPLANT
SUT VIC AB 2-0 SH 27XBRD (SUTURE) IMPLANT
SUT VIC AB 3-0 SH 27X BRD (SUTURE) ×1 IMPLANT
SUTURE MNCRL 4-0 27XMF (SUTURE) ×1 IMPLANT
SYR 10ML LL (SYRINGE) ×1 IMPLANT
TRAP FLUID SMOKE EVACUATOR (MISCELLANEOUS) ×1 IMPLANT
TRAP NEPTUNE SPECIMEN COLLECT (MISCELLANEOUS) ×1 IMPLANT
WATER STERILE IRR 1000ML POUR (IV SOLUTION) ×1 IMPLANT
WATER STERILE IRR 500ML POUR (IV SOLUTION) ×1 IMPLANT

## 2024-03-17 NOTE — Anesthesia Procedure Notes (Addendum)
 Procedure Name: LMA Insertion Date/Time: 03/17/2024 10:56 AM  Performed by: Mohammed Kindle, CRNAPre-anesthesia Checklist: Patient identified, Emergency Drugs available, Suction available and Patient being monitored Patient Re-evaluated:Patient Re-evaluated prior to induction Oxygen Delivery Method: Circle system utilized Preoxygenation: Pre-oxygenation with 100% oxygen Induction Type: IV induction LMA: LMA inserted LMA Size: 4.0 Placement Confirmation: positive ETCO2 and breath sounds checked- equal and bilateral Tube secured with: Tape Dental Injury: Teeth and Oropharynx as per pre-operative assessment

## 2024-03-17 NOTE — Op Note (Signed)
 Preoperative diagnosis: Left breast carcinoma.  Postoperative diagnosis: Same.   Procedure: Left radiofrequency tag-localized partial mastectomy.                       Left Axillary Sentinel Lymph node biopsy  Anesthesia: GETA  Surgeon: Dr. Hazle Quant  Wound Classification: Clean  Indications: Patient is a 58 y.o. female with a nonpalpable left breast mass noted on mammography with core biopsy demonstrating invasive mammary carcinoma requires radiofrequency tag-localized partial mastectomy for treatment with sentinel lymph node biopsy.   Findings: 1. Specimen mammography shows marker and tag on specimen 2. Pathology call refers gross examination of margins was 1 cm from closest margin 3. No other palpable mass or lymph node identified.   Description of procedure: Preoperative radiofrequency tag localization was performed by radiology. In the nuclear medicine suite, the subareolar region was injected with Tc-99 sulfur colloid. Localization studies were reviewed. The patient was taken to the operating room and placed supine on the operating table, and after general anesthesia the left chest and axilla were prepped and draped in the usual sterile fashion. A time-out was completed verifying correct patient, procedure, site, positioning, and implant(s) and/or special equipment prior to beginning this procedure.  By comparing the localization studies and interrogation with [facility] device, the probable trajectory and location of the mass was visualized. A circumareolar skin incision was planned in such a way as to minimize the amount of dissection to reach the mass.  The skin incision was made. Flaps were raised and the location of the tag was confirmed with [facility] device confirmed. A 2-0 silk figure-of-eight stay suture was placed and used for retraction. Dissection was then taken down circumferentially, taking care to include the entire localizing tag and a wide margin of grossly normal  tissue. The specimen and entire localizing tag were removed. The specimen was oriented and sent to radiology with the localization studies. Confirmation was received that the entire target lesion had been resected. The wound was irrigated. Hemostasis was checked. The wound was closed with interrupted sutures of 3-0 Vicryl and a subcuticular suture of Monocryl 3-0. No attempt was made to close the dead space.   A hand-held gamma probe was used to identify the location of the hottest spot in the axilla. An incision was made around the caudal axillary hairline. Dissection was carried down until subdermal facias was advanced. The probe was placed and again, the point of maximal count was found. Dissection continue until nodule was identified. The probe was placed in contact with the node. The node was excised in its entirety.  An additional hot spot was detected and the node was excised in similar fashion. No additional hot spots were identified. No clinically abnormal nodes were palpated. The procedure was terminated. Hemostasis was achieved and the wound closed in layers with deep interrupted 3-0 Vicryl and skin was closed with subcuticular suture of Monocryl 3-0.  The patient tolerated the procedure well and was taken to the postanesthesia care unit in stable condition.   The breast wound was then approached for closure.  Eliminate dead space a tissue transfer technique was utilized.  The breast and pectoralis fascia was elevated off the underlying muscle and the serratus muscle circumferentially for a distance of about 6 centimeters (36 sq cm).  The fascial layer was then approximated with interrupted 2-0 Vicryl sutures.  The superficial layer of the breast parenchyma was then approximated in a similar fashion.  This was done in  a radial direction.  The skin flaps were then elevated circumferentially to remove a ripple noted superiorly and medially. The skin was closed with 4-0 Monocryl. Dermabond was  applied.  Sentinel Node Biopsy Synoptic Operative Report  Operation performed with curative intent:Yes  Tracer(s) used to identify sentinel nodes in the upfront surgery (non-neoadjuvant) setting (select all that apply):Radioactive Tracer  Tracer(s) used to identify sentinel nodes in the neoadjuvant setting (select all that apply):N/A  All nodes (colored or non-colored) present at the end of a dye-filled lymphatic channel were removed:N/A  All significantly radioactive nodes were removed:Yes  All palpable suspicious nodes were removed:N/A  Biopsy-proven positive nodes marked with clips prior to chemotherapy were identified and removed:N/A  Specimen: Left Breast mass                     Left Axillary Sentinel Lymph nodes #1, #2  Complications: None  Estimated Blood Loss: 10 mL

## 2024-03-17 NOTE — Transfer of Care (Signed)
 Immediate Anesthesia Transfer of Care Note  Patient: Zoe [name]  Procedure(s) Performed: BREAST LUMPECTOMY (Left: Breast) BIOPSY, LYMPH NODE, SENTINEL, AXILLARY (Left: Breast)  Patient Location: PACU  Anesthesia Type:General  Level of Consciousness: drowsy and patient cooperative  Airway & Oxygen Therapy: Patient Spontanous Breathing and Patient connected to face mask oxygen  Post-op Assessment: Report given to RN and Post -op Vital signs reviewed and stable  Post vital signs: Reviewed and stable  Last Vitals:  Vitals Value Taken Time  BP    Temp    Pulse 70 03/17/24 1237  Resp 16 03/17/24 1237  SpO2 97 % 03/17/24 1237  Vitals shown include unfiled device data.  Last Pain:  Vitals:   03/17/24 0924  PainSc: 0-No pain         Complications: No notable events documented.

## 2024-03-17 NOTE — Discharge Instructions (Signed)

## 2024-03-17 NOTE — Interval H&P Note (Signed)
 History and Physical Interval Note:  03/17/2024 10:24 AM  Zoe [name]  has presented today for surgery, with the diagnosis of C50.312, Z17.0 ,alignant neoplasm of lower-inner quadrant of lt breast infemale, estrogen receptor positive.  The various methods of treatment have been discussed with the patient and family. After consideration of risks, benefits and other options for treatment, the patient has consented to  Procedure(s) with comments: BREAST LUMPECTOMY (Left) - w/ RF tag BIOPSY, LYMPH NODE, SENTINEL, AXILLARY (Left) as a surgical intervention.  The patient's history has been reviewed, patient examined, no change in status, stable for surgery.  I have reviewed the patient's chart and labs.  Questions were answered to the patient's satisfaction.     Carolan Shiver

## 2024-03-17 NOTE — Anesthesia Preprocedure Evaluation (Signed)
 Anesthesia Evaluation  Patient identified by MRN, date of birth, ID band Patient awake    Reviewed: Allergy & Precautions, H&P , NPO status , Patient's Chart, lab work & pertinent test results, reviewed documented beta blocker date and time   History of Anesthesia Complications Negative for: history of anesthetic complications  Airway Mallampati: II   Neck ROM: full    Dental  (+) Poor Dentition, Dental Advidsory Given   Pulmonary neg shortness of breath, asthma , neg recent URI   Pulmonary exam normal        Cardiovascular Exercise Tolerance: Good hypertension, On Medications (-) angina + CAD and + Past MI  (-) Cardiac Stents and (-) CABG Normal cardiovascular exam(-) dysrhythmias (-) Valvular Problems/Murmurs Rhythm:regular Rate:Normal  Followed by [facility] and doing well sp mi in March.     Neuro/Psych negative neurological ROS  negative psych ROS   GI/Hepatic Neg liver ROS,GERD  Medicated,,  Endo/Other  negative endocrine ROSdiabetes, Well Controlled, Type 2    Renal/GU negative Renal ROS  negative genitourinary   Musculoskeletal   Abdominal   Peds  Hematology negative hematology ROS (+)   Anesthesia Other Findings Past Medical History: No date: Allergic rhinitis No date: Asthma No date: Coronary artery disease No date: GERD (gastroesophageal reflux disease) 07/18/10; 08/25/15: History of mammogram     Comment:  neg; neg 07/2013; 08/25/15: History of Papanicolaou smear of cervix     Comment:  -/-; neg No date: Hypercholesteremia No date: Hypertension No date: IBS (irritable bowel syndrome) 02/2019: Myocardial infarction [facility]) Past Surgical History: No date: CARDIAC CATHETERIZATION 2001: COLONOSCOPY No date: CORONARY ANGIOPLASTY 02/29/2016: DILATATION & CURETTAGE/HYSTEROSCOPY WITH MYOSURE; N/A     Comment:  Procedure: DILATATION & CURETTAGE/HYSTEROSCOPY WITH               MYOSURE;  Surgeon: Elenora Fender Ward, MD;   Location: [facility]               ORS;  Service: Gynecology;  Laterality: N/A; No date: DILATION AND CURETTAGE OF UTERUS 2015: EAR CYST EXCISION; Left     Comment:  IN OFFICE No date: ESOPHAGOGASTRODUODENOSCOPY 02/29/2016: INTRAUTERINE DEVICE (IUD) INSERTION; N/A     Comment:  Procedure: INTRAUTERINE DEVICE (IUD) INSERTION;                Surgeon: Elenora Fender Ward, MD;  Location: [facility] ORS;                Service: Gynecology;  Laterality: N/A; 03/16/2019: LEFT HEART CATH AND CORONARY ANGIOGRAPHY; Left     Comment:  Procedure: LEFT HEART CATH AND CORONARY ANGIOGRAPHY;                Surgeon: Laurier Nancy, MD;  Location: [facility] INVASIVE CV              LAB;  Service: Cardiovascular;  Laterality: Left; BMI    Body Mass Index: 30.38 kg/m     Reproductive/Obstetrics negative OB ROS                             Anesthesia Physical Anesthesia Plan  ASA: 3  Anesthesia Plan: General   Post-op Pain Management:    Induction: Intravenous  PONV Risk Score and Plan: 3 and Ondansetron, Dexamethasone, Treatment may vary due to age or medical condition and Midazolam  Airway Management Planned: LMA  Additional Equipment:   Intra-op Plan:   Post-operative Plan: Extubation in OR  Informed  Consent: I have reviewed the patients History and Physical, chart, labs and discussed the procedure including the risks, benefits and alternatives for the proposed anesthesia with the patient or authorized representative who has indicated his/her understanding and acceptance.     Dental Advisory Given  Plan Discussed with: CRNA  Anesthesia Plan Comments:         Anesthesia Quick Evaluation

## 2024-03-18 ENCOUNTER — Other Ambulatory Visit: Payer: Self-pay | Admitting: Pathology

## 2024-03-18 ENCOUNTER — Encounter: Payer: Self-pay | Admitting: General Surgery

## 2024-03-18 LAB — SURGICAL PATHOLOGY

## 2024-03-18 NOTE — Anesthesia Postprocedure Evaluation (Signed)
 Anesthesia Post Note  Patient: Zoe [name]  Procedure(s) Performed: BREAST LUMPECTOMY (Left: Breast) BIOPSY, LYMPH NODE, SENTINEL, AXILLARY (Left: Breast)  Patient location during evaluation: PACU Anesthesia Type: General Level of consciousness: awake and alert Pain management: pain level controlled Vital Signs Assessment: post-procedure vital signs reviewed and stable Respiratory status: spontaneous breathing, nonlabored ventilation, respiratory function stable and patient connected to nasal cannula oxygen Cardiovascular status: blood pressure returned to baseline and stable Postop Assessment: no apparent nausea or vomiting Anesthetic complications: no   No notable events documented.   Last Vitals:  Vitals:   03/17/24 1300 03/17/24 1330  BP: 115/78 123/76  Pulse: 73 86  Resp: 18 15  Temp: 36.6 C 36.6 C  SpO2: 92% 95%    Last Pain:  Vitals:   03/17/24 1330  TempSrc: Temporal  PainSc: 0-No pain                 Lenard Simmer

## 2024-03-19 ENCOUNTER — Encounter: Payer: Self-pay | Admitting: *Deleted

## 2024-03-19 NOTE — Progress Notes (Signed)
 Oncotype Dx order [id number] submitted online

## 2024-03-29 ENCOUNTER — Other Ambulatory Visit

## 2024-03-29 NOTE — Progress Notes (Signed)
 Tumor Board Documentation  Dakoda Bassette was presented by Dr. Wilhelmenia Harada at our Tumor Board on 03/29/2024, which included representatives from medical oncology, radiation oncology, surgical, pathology, radiology, navigation, research.  Amelia currently presents as a current patient with history of the following treatments: surgical intervention(s).  Additionally, we reviewed previous medical and familial history, history of present illness, and recent lab results along with all available histopathologic and imaging studies. The tumor board considered available treatment options and made the following recommendations: Adjuvant radiation, Hormonal therapy    The following procedures/referrals were also placed: No orders of the defined types were placed in this encounter.      Staging used: AJCC Staging: T: T1c N: N0       National site-specific guidelines   were discussed with respect to the case.  Tumor board is a meeting of clinicians from various specialty areas who evaluate and discuss patients for whom a multidisciplinary approach is being considered. Final determinations in the plan of care are those of the provider(s). The responsibility for follow up of recommendations given during tumor board is that of the provider.   Today's extended care, comprehensive team conference, Tanner was not present for the discussion and was not examined.   Multidisciplinary Tumor Board is a multidisciplinary case peer review process.  Decisions discussed in the Multidisciplinary Tumor Board reflect the opinions of the specialists present at the conference without having examined the patient.  Ultimately, treatment and diagnostic decisions rest with the primary provider(s) and the patient.

## 2024-03-31 ENCOUNTER — Encounter

## 2024-04-01 ENCOUNTER — Encounter: Payer: Self-pay | Admitting: Oncology

## 2024-04-05 ENCOUNTER — Other Ambulatory Visit: Payer: Self-pay | Admitting: Cardiovascular Disease

## 2024-04-05 DIAGNOSIS — E785 Hyperlipidemia, unspecified: Secondary | ICD-10-CM

## 2024-04-07 ENCOUNTER — Ambulatory Visit: Attending: Oncology | Admitting: Occupational Therapy

## 2024-04-07 ENCOUNTER — Inpatient Hospital Stay: Attending: Oncology | Admitting: Oncology

## 2024-04-07 ENCOUNTER — Ambulatory Visit
Admission: RE | Admit: 2024-04-07 | Discharge: 2024-04-07 | Disposition: A | Discharge status: Home / Self Care | Source: Ambulatory Visit | Attending: Radiation Oncology | Admitting: Radiation Oncology

## 2024-04-07 ENCOUNTER — Encounter: Payer: Self-pay | Admitting: Oncology

## 2024-04-07 ENCOUNTER — Encounter: Payer: Self-pay | Admitting: Occupational Therapy

## 2024-04-07 VITALS — BP 145/87 | HR 61 | Temp 97.7°F | Resp 18 | Wt 161.8 lb

## 2024-04-07 DIAGNOSIS — Z7952 Long term (current) use of systemic steroids: Secondary | ICD-10-CM | POA: Insufficient documentation

## 2024-04-07 DIAGNOSIS — C50812 Malignant neoplasm of overlapping sites of left female breast: Secondary | ICD-10-CM | POA: Insufficient documentation

## 2024-04-07 DIAGNOSIS — E119 Type 2 diabetes mellitus without complications: Secondary | ICD-10-CM | POA: Diagnosis not present

## 2024-04-07 DIAGNOSIS — J45909 Unspecified asthma, uncomplicated: Secondary | ICD-10-CM | POA: Diagnosis not present

## 2024-04-07 DIAGNOSIS — E7800 Pure hypercholesterolemia, unspecified: Secondary | ICD-10-CM | POA: Insufficient documentation

## 2024-04-07 DIAGNOSIS — I2510 Atherosclerotic heart disease of native coronary artery without angina pectoris: Secondary | ICD-10-CM | POA: Diagnosis not present

## 2024-04-07 DIAGNOSIS — C50912 Malignant neoplasm of unspecified site of left female breast: Secondary | ICD-10-CM | POA: Diagnosis not present

## 2024-04-07 DIAGNOSIS — Z79899 Other long term (current) drug therapy: Secondary | ICD-10-CM | POA: Diagnosis not present

## 2024-04-07 DIAGNOSIS — Z170 Estrogen receptor positive status [ER+]: Secondary | ICD-10-CM | POA: Insufficient documentation

## 2024-04-07 DIAGNOSIS — Z9189 Other specified personal risk factors, not elsewhere classified: Secondary | ICD-10-CM | POA: Insufficient documentation

## 2024-04-07 DIAGNOSIS — I252 Old myocardial infarction: Secondary | ICD-10-CM | POA: Insufficient documentation

## 2024-04-07 DIAGNOSIS — K219 Gastro-esophageal reflux disease without esophagitis: Secondary | ICD-10-CM | POA: Diagnosis not present

## 2024-04-07 DIAGNOSIS — Z7189 Other specified counseling: Secondary | ICD-10-CM

## 2024-04-07 DIAGNOSIS — Z7984 Long term (current) use of oral hypoglycemic drugs: Secondary | ICD-10-CM | POA: Diagnosis not present

## 2024-04-07 DIAGNOSIS — C50512 Malignant neoplasm of lower-outer quadrant of left female breast: Secondary | ICD-10-CM | POA: Diagnosis present

## 2024-04-07 DIAGNOSIS — Z7902 Long term (current) use of antithrombotics/antiplatelets: Secondary | ICD-10-CM | POA: Diagnosis not present

## 2024-04-07 DIAGNOSIS — I10 Essential (primary) hypertension: Secondary | ICD-10-CM | POA: Insufficient documentation

## 2024-04-07 MED ORDER — DEXAMETHASONE 4 MG PO TABS: ORAL_TABLET | ORAL | 1 refills | Status: DC

## 2024-04-07 MED ORDER — ONDANSETRON HCL 8 MG PO TABS: 8.0000 mg | ORAL_TABLET | Freq: Three times a day (TID) | ORAL | 3 refills | Status: DC | PRN

## 2024-04-07 MED ORDER — PROCHLORPERAZINE MALEATE 10 MG PO TABS: 10.0000 mg | ORAL_TABLET | Freq: Four times a day (QID) | ORAL | 3 refills | Status: DC | PRN

## 2024-04-07 NOTE — Assessment & Plan Note (Signed)
 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  Recommend adjuvant chemotherapy.  Given her history of cardiovascular disease including CAD, NSTEMI, I recommend non anthracycline regimen with TC Q3 weeks x 4 cycles.  I explained to the patient the risks and benefits of chemotherapy including all but not limited to infusion reaction, hair loss, hearing loss, mouth sore, nausea, vomiting, low blood counts, bleeding, neuropathy, kidney failure and risk of life threatening infection and even death, etc.   Patient voices understanding and willing to proceed chemotherapy.   # Chemotherapy education;  Dexamethasone  Antiemetics-Zofran  and Compazine ;  sent to pharmacy Supportive care measures are necessary for patient well-being and will be provided as necessary. We spent sufficient time to discuss many aspect of care, questions were answered to patient's satisfaction.

## 2024-04-07 NOTE — Assessment & Plan Note (Signed)
 Follow up with cardiology

## 2024-04-07 NOTE — Therapy (Signed)
 OUTPATIENT OCCUPATIONAL THERAPY BREAST CANCER POSTOP EVALUATION   Patient Name: Zoe [name] MRN: [id number] DOB:18-Oct-1966, 58 y.o., female Today's Date: 04/07/2024  END OF SESSION:  OT End of Session - 04/07/24 1329     Visit Number 1    Number of Visits 3    Date for OT Re-Evaluation 06/30/24    OT Start Time 0955    OT Stop Time 1025    OT Time Calculation (min) 30 min    Activity Tolerance Patient tolerated treatment well    Behavior During Therapy [facility] for tasks assessed/performed             Past Medical History:  Diagnosis Date   Allergic rhinitis    Asthma    Complication of anesthesia    a.) delayed/prolonged emergence   Coronary artery disease    Dysplastic nevi 08/30/2020   Right lateral distal deltoid, left lower back post waistline. 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   T2DM (type 2 diabetes mellitus) (HCC)    Unstable angina (HCC)    Past Surgical History:  Procedure Laterality Date   AXILLARY SENTINEL NODE BIOPSY Left 03/17/2024   Procedure: BIOPSY, LYMPH NODE, SENTINEL, AXILLARY;  Surgeon: Eldred Grego, MD;  Location: [facility] ORS;  Service: General;  Laterality: Left;   BREAST BIOPSY Left 03/03/2024   US  LT BREAST Bx venus marker,   8:00 8 cmfn : - INVASIVE DUCTAL CARCINOMA   BREAST BIOPSY Left 03/11/2024   US  LT RADIO FREQUENCY TAG LOC US  GUIDE 03/11/2024 [facility]    BREAST LUMPECTOMY Left 03/17/2024   Procedure: BREAST LUMPECTOMY;  Surgeon: Eldred Grego, MD;  Location: [facility] ORS;  Service: General;  Laterality: Left;  w/ RF tag   CARDIAC CATHETERIZATION     COLONOSCOPY  2001   COLONOSCOPY WITH PROPOFOL  N/A 11/05/2019   Procedure: COLONOSCOPY WITH PROPOFOL ;  Surgeon: Irby Mannan, MD;  Location: [facility];  Service: Endoscopy;  Laterality: N/A;   CORONARY ANGIOPLASTY     DILATATION & CURETTAGE/HYSTEROSCOPY WITH MYOSURE N/A 02/29/2016   Procedure: DILATATION & CURETTAGE/HYSTEROSCOPY WITH MYOSURE;  Surgeon: Margarie Shay Ward, MD;  Location: [facility] OR[facility];  Service: Gynecology;  Laterality: N/A;   DILATION AND CURETTAGE OF UTERUS     EAR CYST EXCISION Left 2015   IN OFFICE   ESOPHAGOGASTRODUODENOSCOPY     INTRAUTERINE DEVICE (IUD) INSERTION N/A 02/29/2016   Procedure: INTRAUTERINE DEVICE (IUD) INSERTION;  Surgeon: Margarie Shay Ward, MD;  Location: [facility] ORS;  Service: Gynecology;  Laterality: N/A;   LEFT HEART CATH AND CORONARY ANGIOGRAPHY Left 03/16/2019   Procedure: LEFT HEART CATH AND CORONARY ANGIOGRAPHY;  Surgeon: Cherrie Cornwall, MD;  Location: [facility];  Service: Cardiovascular;  Laterality: Left;   Patient Active Problem List   Diagnosis Date Noted   Goals of care, counseling/discussion 04/07/2024   Invasive ductal carcinoma of breast, female, left (HCC) 03/08/2024   Mixed  hyperlipidemia 05/29/2023   Type 2 diabetes mellitus with hyperglycemia, without long-term current use of insulin (HCC) 05/29/2023   Encounter for screening colonoscopy    Unstable angina (HCC) 03/16/2019   NSTEMI (non-ST elevated myocardial infarction) (HCC) 03/16/2019   IBS (irritable bowel syndrome)    Hypertension    GERD (gastroesophageal reflux disease)    Pure hypercholesterolemia 04/11/2015   Coronary artery disease involving native coronary artery of native heart without angina pectoris 04/11/2015   Allergic rhinitis, seasonal 04/11/2015     PCP: Scoggins NP  REFERRING PROVIDER: DR Wilhelmenia Harada  REFERRING DIAG: L breast Cancer  THERAPY DIAG:  Invasive ductal carcinoma of breast, female, left (HCC)  At risk for lymphedema  Rationale for Evaluation and Treatment: Rehabilitation  ONSET DATE: 03/17/24  SUBJECTIVE:                                                                                                                                                                                           SUBJECTIVE STATEMENT: Patient reports she is here today after being refer by one of her medical team for her newly diagnosed left breast cancer. She had lumpectomy on 03/17/24 by  Dr Charmel Cooter  PERTINENT HISTORY:  Patient was diagnosed with left  breast cancer - Had L lumpectomy on 03/17/24 and plan to have chemo and then radiation.  PATIENT GOALS:   reduce lymphedema risk and learn post op HEP.   PAIN:  Are you having pain? No  PRECAUTIONS: Active CA Lymphedema risk     HAND DOMINANCE: left  WEIGHT BEARING RESTRICTIONS: No  FALLS:  Has patient fallen in last 6 months? 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  She has a 22-month-old grandbaby.  Likes to walk.  She does her own housework.   OBJECTIVE:  COGNITION: Overall cognitive status: Within functional limits for tasks assessed    POSTURE:  Forward head and rounded shoulders posture  UPPER EXTREMITY AROM/PROM:  Bilateral upper extremities shoulder range of motion within normal limits.  Slight pull less than 1/10. CERVICAL AROM: All within normal limits:    Percent limited  Flexion   Extension   Right lateral flexion   Left lateral flexion   Right rotation   Left rotation     UPPER EXTREMITY STRENGTH: Strength at 90 degrees flexion abduction and external/internal rotation at side within normal limits 5/5  LYMPHEDEMA ASSESSMENTS:  Patient report surgeon  said there was some postop swelling at this incision on the bottom of breast.  LANDMARK RIGHT   eval  10 cm  proximal to olecranon process   Olecranon process   10 cm proximal to ulnar styloid process   Just proximal to ulnar styloid process   Across hand at thumb web space   At base of 2nd digit   (Blank rows = not tested)  LANDMARK LEFT   eval  10 cm proximal to olecranon process   Olecranon process   10 cm proximal to ulnar styloid process   Just proximal to ulnar styloid process   Across hand at thumb web space   At base of 2nd digit   (Blank rows = not tested)  L-DEX LYMPHEDEMA SCREENING:  The patient was assessed using the L-Dex machine today to produce a lymphedema index baseline score. The patient will be reassessed on a regular basis (typically every 3 months) to obtain new L-Dex scores. If the score is > 6.5 points away from his/her baseline score indicating onset of subclinical lymphedema, it will be recommended to wear a compression garment for 4 weeks, 12 hours per day and then be reassessed. If the score continues to be > 6.5 points from baseline at reassessment, we will initiate lymphedema treatment. Assessing in this manner has a 95% rate of preventing clinically significant lymphedema.   L-DEX FLOWSHEETS - 04/07/24 1300       L-DEX LYMPHEDEMA SCREENING   Measurement Type Unilateral    L-DEX MEASUREMENT EXTREMITY Upper Extremity    POSITION  Standing    DOMINANT SIDE Left    At Risk Side Left    BASELINE SCORE (UNILATERAL) 1.2            Reviewed with patient active assisted range of motion for shoulder flexion and abduction as well as external rotation to be done throughout radiation.  To maintain flexibility and not compensate with upper trap or shoulder to overcome any tightness or stiffness.   PATIENT EDUCATION:  Education details: Lymphedema risk reduction and post op shoulder/posture HEP Person educated: Patient Education method: Explanation,  Demonstration, Handout Education comprehension: Patient verbalized understanding and returned demonstration .   ASSESSMENT:  CLINICAL IMPRESSION: Her multidisciplinary medical team has met to assess and determine a recommended treatment plan. She had left lumpectomy by Dr. Dortha Gauss on 03/17/2024 done.  2 lymph nodes removed.  Patient known to have chemo first and then radiation.  Patient's left shoulder active range of motion is within normal limits as well as external rotation.  With a slight pull.  Patient L-Dex score was done today and within normal limits or range.  Would recommend for her to repeat after chemo and then after radiation Ultec screen.  At that time it can be done by the breast navigator except if any rehab needs comes up in between.  The breast navigator can after that to L-Dex screens every 3 months for 2 years to detect subclinical lymphedema.  Pt will benefit from skilled therapeutic intervention to improve on the following deficits: Decreased knowledge of precautions and lymphedema education, impaired UE functional use, pain, decreased ROM, postural dysfunction.   OT treatment/interventions: ADL/self-care home management, pt/family education, therapeutic exercise,manual therapy  REHAB POTENTIAL: Good  CLINICAL DECISION MAKING: Stable/uncomplicated  EVALUATION COMPLEXITY: Low   GOALS: Goals reviewed with patient? YES  LONG TERM GOALS: (STG=LTG)    Name Target Date Goal status  1 Pt will be able to verbalize understanding of pertinent lymphedema risk reduction practices relevant to her dx specifically related to skin care.  Baseline:  No knowledge 04/07/24 Achieved at eval  2 Pt will be  able to return demo and/or verbalize understanding of the post op HEP related to regaining shoulder ROM. Baseline:  No knowledge 04/07/24 Achieved at eval       4 Pt will demo she has regained full shoulder ROM and function post operatively compared to baselines.  Baseline: See  objective measurements taken today. 04/07/24 Met today    PLAN:  OT FREQUENCY/DURATION: EVAL and 1 follow up appointment if needed   T Occupational Therapy Information for After Breast Cancer Surgery/Treatment:  Lymphedema is a swelling condition that you may be at risk for in your arm if you have lymph nodes removed from the armpit area.  After a sentinel node biopsy, the risk is approximately 5-9% and is higher after an axillary node dissection.  There is treatment available for this condition and it is not life-threatening.  Contact your physician or occupational therapist with concerns. You may begin the 4 shoulder/posture exercises (see additional sheet) when permitted by your physician (typically a week after surgery).  If you have drains, you may need to wait until those are removed before beginning range of motion exercises.  A general recommendation is to not lift your arms above shoulder height until drains are removed.  These exercises should be done to your tolerance and gently.  This is not a "no pain/no gain" type of recovery so listen to your body and stretch into the range of motion that you can tolerate, stopping if you have pain.  If you are having immediate reconstruction, ask your plastic surgeon about doing exercises as he or she may want you to wait. .  While undergoing any medical procedure or treatment, try to avoid blood pressure being taken or needle sticks from occurring on the arm on the side of cancer.   This recommendation begins after surgery and continues for the rest of your life.  This may help reduce your risk of getting lymphedema (swelling in your arm). An excellent resource for those seeking information on lymphedema is the National Lymphedema Network's web site. It can be accessed at www.lymphnet.org If you notice swelling in your hand, arm or breast at any time following surgery (even if it is many years from now), please contact your doctor or occupational  therapist to discuss this.  Lymphedema can be treated at any time but it is easier for you if it is treated early on.  If you feel like your shoulder motion is not returning to normal in a reasonable amount of time, please contact your surgeon or occupational therapist.  [facility] [phone]. [address], [place], [place] [address]       Heloise Lobo, OTR/L,CLT 04/07/2024, 1:32 PM

## 2024-04-07 NOTE — Progress Notes (Signed)
 START ON PATHWAY REGIMEN - Breast     A cycle is every 21 days:     Cyclophosphamide      Docetaxel   **Always confirm dose/schedule in your pharmacy ordering system**  Patient Characteristics: Postoperative without Neoadjuvant Therapy, M0 (Pathologic Staging), Invasive Disease, Adjuvant Therapy, HER2 Negative, ER Positive, Node Negative, pT1a, pN38mi or pT1b-c, pN0/N29mi, Oncotype High Risk (? 26) Therapeutic Status: Postoperative without Neoadjuvant Therapy, M0 (Pathologic Staging) AJCC Grade: G3 AJCC N Category: pN0 AJCC M Category: cM0 ER Status: Positive (+) AJCC 8 Stage Grouping: IA HER2 Status: Negative (-) Oncotype Dx Recurrence Score: 28 AJCC T Category: pT1c PR Status: Positive (+) Has this patient completed genomic testing<= Yes - Oncotype DX(R) Intent of Therapy: Curative Intent, Discussed with Patient

## 2024-04-07 NOTE — Assessment & Plan Note (Signed)
Curative intent. Discussed with patient.  

## 2024-04-07 NOTE — Progress Notes (Signed)
 Hematology/Oncology Progress note [phone] [phone]        REFERRING PROVIDER: Timmy Forbes, MD    CHIEF COMPLAINTS/PURPOSE OF CONSULTATION:  Left breast cancer  ASSESSMENT & PLAN:   Cancer Staging  Invasive ductal carcinoma of breast, female, left (HCC) Staging form: Breast, AJCC 8th Edition - Clinical stage from 03/08/2024: Stage IA (cT1b, cN0, cM0, G2, ER+, PR+, HER2-) - Signed by Timmy Forbes, MD on 03/08/2024 - Pathologic stage from 04/07/2024: Stage IA (pT1c, pN0, cM0, G3, ER+, PR+, HER2-, Oncotype DX score: 28) - Signed by Timmy Forbes, MD on 04/07/2024   Invasive ductal carcinoma of breast, female, left (HCC) Left breast invasive ductal carcinoma, pT1b cN0 ER 99%+/PR 30%+, HER2 low (1+) Pathology and radiology counseling: Oncotype DX RS 28, >15% chemotherapy benefit.  Recommend adjuvant chemotherapy.  Given her history of cardiovascular disease including CAD, NSTEMI, I recommend non anthracycline regimen with TC Q3 weeks x 4 cycles.  I explained to the patient the risks and benefits of chemotherapy including all but not limited to infusion reaction, hair loss, hearing loss, mouth sore, nausea, vomiting, low blood counts, bleeding, neuropathy, kidney failure and risk of life threatening infection and even death, etc.   Patient voices understanding and willing to proceed chemotherapy.   # Chemotherapy education;  Dexamethasone  Antiemetics-Zofran  and Compazine ;  sent to pharmacy Supportive care measures are necessary for patient well-being and will be provided as necessary. We spent sufficient time to discuss many aspect of care, questions were answered to patient's satisfaction.   Goals of care, counseling/discussion Curative intent. Discussed with patient.   Coronary artery disease involving native coronary artery of native heart without angina pectoris Follow up with cardiology.    Orders Placed This Encounter  Procedures   Consent Attestation for  Oncology Treatment    The patient is informed of risks, benefits, side-effects of the prescribed oncology treatment. Potential short term and long term side effects and response rates discussed. After a long discussion, the patient made informed decision to proceed.:   Yes   CBC with Differential (Cancer Center Only)    Standing Status:   Future    Expected Date:   04/21/2024    Expiration Date:   04/21/2025   CMP (Cancer Center only)    Standing Status:   Future    Expected Date:   04/21/2024    Expiration Date:   04/21/2025   CBC with Differential (Cancer Center Only)    Standing Status:   Future    Expected Date:   05/12/2024    Expiration Date:   05/12/2025   CMP (Cancer Center only)    Standing Status:   Future    Expected Date:   05/12/2024    Expiration Date:   05/12/2025   CBC with Differential (Cancer Center Only)    Standing Status:   Future    Expected Date:   06/02/2024    Expiration Date:   06/02/2025   CMP (Cancer Center only)    Standing Status:   Future    Expected Date:   06/02/2024    Expiration Date:   06/02/2025   CBC with Differential (Cancer Center Only)    Standing Status:   Future    Expected Date:   06/23/2024    Expiration Date:   06/23/2025   CMP (Cancer Center only)    Standing Status:   Future    Expected Date:   06/23/2024    Expiration Date:   06/23/2025   Follow  up 2 weeks to start chemotherapy.  All questions were answered. The patient knows to call the clinic with any problems, questions or concerns. We spent sufficient time to discuss many aspect of care, questions were answered to patient's satisfaction.   Timmy Forbes, MD, PhD [facility] Hematology Oncology 04/07/2024    HISTORY OF PRESENTING ILLNESS:  Zoe [name] 58 y.o. female presents to establish care for left breast cancer I have reviewed her chart and materials related to her cancer extensively and collaborated history with the patient. Summary of oncologic history is as follows: Oncology History   Invasive ductal carcinoma of breast, female, left (HCC)  02/17/2024 Mammogram   Bilateral screening mammogram showed  FINDINGS: In the left breast, a possible mass warrants further evaluation. In the right breast, no findings suspicious for malignancy.   IMPRESSION: Further evaluation is suggested for a possible mass in the left breast.    02/24/2024 Mammogram   Unilateral left breast diagnostic mammogram  1. There is an 8 mm mass at the site of screening mammographic concern which is highly suspicious for malignancy. Recommend ultrasound-guided biopsy for definitive characterization. 2. No suspicious LEFT axillary adenopathy.   03/08/2024 Initial Diagnosis   Invasive ductal carcinoma of breast, female, left   Patient underwent left breast mass biopsy Pathology showed  1. Breast, left, needle core biopsy, 8 o'clock, 8cmfn :       - INVASIVE DUCTAL CARCINOMA, GRADE 2, EXTENDING TO 0.6 CM IN MAXIMUM EXTENT, AND       INVOLVING TWO OF TWO BIOPSY FRAGMENTS.  SEE COMMENT.       - TUBULE FORMATION: SCORE 3       - NUCLEAR PLEOMORPHISM: SCORE 2       - MITOTIC COUNT: SCORE 1       - TOTAL SCORE: 6       - OVERALL GRADE:  2       - LYMPHOVASCULAR INVASION: NOT IDENTIFIED       - CANCER LENGTH: 6 MM       - CALCIFICATIONS: NOT IDENTIFIED       - LYMPHOVASCULAR INVASION: NOT IDENTIFIED.   ER + 99% PR + 30%, Her2  negative (1+)    Menarche at age of 41 First live birth at age of 89 OCP use: >5 years, then switched to IUD History of hysterectomy: no Menopausal status: unsure History of HRT use: No History of chest radiation: no  Number of previous breast biopsies:  no  Denies family history of cancer, except that her father's cousin has breast cancer.    03/08/2024 Cancer Staging   Staging form: Breast, AJCC 8th Edition - Clinical stage from 03/08/2024: Stage IA (cT1b, cN0, cM0, G2, ER+, PR+, HER2-) - Signed by Timmy Forbes, MD on 03/08/2024 Stage prefix: Initial diagnosis Histologic  grading system: 3 grade system   03/17/2024 Surgery   Patient underwent left breast lumpectomy and SLNB  1. Breast, lumpectomy, left breast mass :       - INVASIVE MAMMARY CARCINOMA OF NO SPECIAL TYPE (DUCTAL).       - DUCTAL CARCINOMA IN SITU (DCIS).       - SEE CANCER SUMMARY BELOW.       - PRIOR BIOPSY SITE CHANGE WITH CLIP.       - SCOUT TAG IS PRESENT.        2. Lymph node, sentinel, biopsy, left axillary sentinel lymph node #1 :       - ONE LYMPH NODE NEGATIVE  FOR MALIGNANCY (0/1).        3. Lymph node, sentinel, biopsy, left axillary sentinel lymph node #2 :       - ONE LYMPH NODE NEGATIVE FOR MALIGNANCY (0/1).    TUMOR Histologic Type: Invasive carcinoma of no special type (ductal) Histologic Grade (Nottingham Histologic Score) Glandular (Acinar)/Tubular Differentiation: 3 Nuclear Pleomorphism: 2 Mitotic Rate: 3 Overall Grade: 3 Tumor Size: Greatest dimension of largest invasive focus: 12 mm Ductal Carcinoma In Situ (DCIS): Present, intermediate to high-grade Tumor Extent: Not applicable Lymphatic and/or Vascular Invasion: Not identified Treatment Effect in the Breast: No known presurgical therapy  MARGINS Margin Status for Invasive Carcinoma: All margins negative for invasive carcinoma Distance from closest margin: 10 mm Specify closest margin: Anterior Margin Status for DCIS: All margins negative for DCIS Distance from DCIS to closest margin: 0.1 mm Specify closest margin: Anterior                      REGIONAL LYMPH NODES Regional Lymph Node Status: All regional lymph nodes negative for tumor Total Number of Lymph Nodes Examined (sentinel and non-sentinel): 2 Number of Sentinel Nodes Examined: 2  PATHOLOGIC STAGE CLASSIFICATION (pTNM, AJCC 8th Edition):  Modified Classification: Not applicable  pT Category: pT1c  T Suffix: Not applicable  pN Category: pN0  N Suffix: (sn)  pM Category: Not applicable     04/07/2024 Cancer Staging   Staging form: Breast,  AJCC 8th Edition - Pathologic stage from 04/07/2024: Stage IA (pT1c, pN0, cM0, G3, ER+, PR+, HER2-, Oncotype DX score: 28) - Signed by Timmy Forbes, MD on 04/07/2024 Stage prefix: Initial diagnosis Multigene prognostic tests performed: Oncotype DX Recurrence score range: Greater than or equal to 11 Histologic grading system: 3 grade system    She underwent left breast lumpectomy and SLNB.  Today she presents to discuss pathology result and adjuvant plan.  She has no concerns of her surgical site.    MEDICAL HISTORY:  Past Medical History:  Diagnosis Date   Allergic rhinitis    Asthma    Complication of anesthesia    a.) delayed/prolonged emergence   Coronary artery disease    Dysplastic nevi 08/30/2020   Right lateral distal deltoid, left lower back post waistline. 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   T2DM (type 2 diabetes mellitus) (HCC)    Unstable angina (HCC)     SURGICAL HISTORY: Past Surgical History:  Procedure Laterality Date   AXILLARY SENTINEL NODE BIOPSY Left 03/17/2024   Procedure: BIOPSY, LYMPH NODE, SENTINEL, AXILLARY;  Surgeon: Eldred Grego, MD;  Location: [facility] OR[facility];  Service: General;  Laterality: Left;   BREAST BIOPSY Left 03/03/2024   US  LT BREAST Bx venus marker,   8:00 8 cmfn : - INVASIVE DUCTAL CARCINOMA   BREAST BIOPSY Left 03/11/2024   US  LT RADIO FREQUENCY TAG LOC US  GUIDE  03/11/2024 [facility]   BREAST  LUMPECTOMY Left 03/17/2024   Procedure: BREAST LUMPECTOMY;  Surgeon: Eldred Grego, MD;  Location: [facility];  Service: General;  Laterality: Left;  w/ RF tag   CARDIAC CATHETERIZATION     COLONOSCOPY  2001   COLONOSCOPY WITH PROPOFOL  N/A 11/05/2019   Procedure: COLONOSCOPY WITH PROPOFOL ;  Surgeon: Irby Mannan, MD;  Location: [facility];  Service: Endoscopy;  Laterality: N/A;   CORONARY ANGIOPLASTY     DILATATION & CURETTAGE/HYSTEROSCOPY WITH MYOSURE N/A 02/29/2016   Procedure: DILATATION & CURETTAGE/HYSTEROSCOPY WITH MYOSURE;  Surgeon: Margarie Shay Ward, MD;  Location: [facility];  Service: Gynecology;  Laterality: N/A;   DILATION AND CURETTAGE OF UTERUS     EAR CYST EXCISION Left 2015   IN OFFICE   ESOPHAGOGASTRODUODENOSCOPY     INTRAUTERINE DEVICE (IUD) INSERTION N/A 02/29/2016   Procedure: INTRAUTERINE DEVICE (IUD) INSERTION;  Surgeon: Margarie Shay Ward, MD;  Location: [facility];  Service: Gynecology;  Laterality: N/A;   LEFT HEART CATH AND CORONARY ANGIOGRAPHY Left 03/16/2019   Procedure: LEFT HEART CATH AND CORONARY ANGIOGRAPHY;  Surgeon: Cherrie Cornwall, MD;  Location: [facility];  Service: Cardiovascular;  Laterality: Left;    SOCIAL HISTORY: Social History   Socioeconomic History   Marital status: Married    Spouse name: Szuch,Bobby T (Spouse)   Number of children: 2   Years of education: 18   Highest education level: Not on file  Occupational History   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  Other Topics Concern   Not on file  Social  History Narrative   Not on file   Social Drivers of Health   Financial Resource Strain: Low Risk  (03/09/2024)   Received from [facility]   Overall Financial Resource Strain (CARDIA)    Difficulty of Paying Living Expenses: Not hard at all  Food Insecurity: No Food Insecurity (03/09/2024)   Received from [facility]   Hunger Vital Sign    Worried About Running Out of Food in the Last Year: Never true    Ran Out of Food in the Last Year: Never true  Transportation Needs: No Transportation Needs (03/09/2024)   Received from [facility] - Transportation    In the past 12 months, has lack of transportation kept you from medical appointments or from getting medications?: No    Lack of Transportation (Non-Medical): No  Physical Activity: Not on file  Stress: Not on file  Social Connections: Not on file  Intimate Partner Violence: Not At Risk (03/08/2024)   Humiliation, Afraid, Rape, and Kick questionnaire    Fear of Current or Ex-Partner: No    Emotionally Abused: No    Physically Abused: No    Sexually Abused: No    FAMILY HISTORY: Family History  Problem Relation Age of Onset   Hyperlipidemia Mother    Hypertension Mother    Heart disease Father        died from either MI or stroke  Diabetes Brother        TYPE 2   Heart disease Paternal Grandfather    Diabetes Other        TYPE 2   Cancer Neg Hx    Breast cancer Neg Hx     ALLERGIES:  is allergic to ace inhibitors and neosporin wound cleanser [benzalkonium chloride].  MEDICATIONS:  Current Outpatient Medications  Medication Sig Dispense Refill   aspirin  81 MG tablet Take 81 mg by mouth daily.     Cholecalciferol (VITAMIN D-3 PO) Take 2,000 Units by mouth daily.     clopidogrel  (PLAVIX ) 75 MG tablet TAKE 1 TABLET BY MOUTH EVERY DAY 90 tablet 1   Coenzyme Q10 (CO Q 10 PO) Take 1 capsule by mouth daily.     FARXIGA 10 MG TABS tablet TAKE 1 TABLET BY MOUTH  EVERY DAY IN THE MORNING 30 tablet 11   ibuprofen  (ADVIL ,MOTRIN ) 600 MG tablet Take 1 tablet (600 mg total) by mouth every 6 (six) hours as needed. 30 tablet 0   isosorbide  mononitrate (IMDUR ) 30 MG 24 hr tablet TAKE 1 TABLET BY MOUTH EVERY DAY 90 tablet 2   losartan-hydrochlorothiazide  (HYZAAR) 50-12.5 MG tablet TAKE 1 TABLET BY MOUTH EVERY DAY 90 tablet 0   metFORMIN  (GLUCOPHAGE ) 500 MG tablet TAKE 1 TABLET BY MOUTH 2 TIMES DAILY WITH A MEAL. 180 tablet 1   metoprolol  succinate (TOPROL -XL) 50 MG 24 hr tablet TAKE 1 TABLET BY MOUTH EVERY DAY 90 tablet 2   Omega-3 Fatty Acids (FISH OIL) 1200 MG CAPS Take 1,200 mg by mouth daily.     pantoprazole  (PROTONIX ) 40 MG tablet TAKE 1 TABLET BY MOUTH EVERY DAY 90 tablet 1   rosuvastatin  (CRESTOR ) 40 MG tablet TAKE 1 TABLET BY MOUTH EVERY DAY 90 tablet 1   No current facility-administered medications for this visit.    Review of Systems  Constitutional:  Negative for appetite change, chills, fatigue and fever.  HENT:   Negative for hearing loss and voice change.   Eyes:  Negative for eye problems.  Respiratory:  Negative for chest tightness and cough.   Cardiovascular:  Negative for chest pain.  Gastrointestinal:  Negative for abdominal distention, abdominal pain and blood in stool.  Endocrine: Negative for hot flashes.  Genitourinary:  Negative for difficulty urinating and frequency.   Musculoskeletal:  Negative for arthralgias.  Skin:  Negative for itching and rash.  Neurological:  Negative for extremity weakness.  Hematological:  Negative for adenopathy.  Psychiatric/Behavioral:  Negative for confusion.      PHYSICAL EXAMINATION: ECOG PERFORMANCE STATUS: 0 - Asymptomatic  Vitals:   04/07/24 0828  BP: (!) 145/87  Pulse: 61  Resp: 18  Temp: 97.7 F (36.5 C)  SpO2: 98%   Filed Weights   04/07/24 0828  Weight: 161 lb 12.8 oz (73.4 kg)    Physical Exam Constitutional:      General: She is not in acute distress.    Appearance:  She is not diaphoretic.  HENT:     Head: Normocephalic and atraumatic.     Nose: Nose normal.     Mouth/Throat:     Pharynx: No oropharyngeal exudate.  Eyes:     General: No scleral icterus.    Pupils: Pupils are equal, round, and reactive to light.  Cardiovascular:     Rate and Rhythm: Normal rate and regular rhythm.     Heart sounds: No murmur heard. Pulmonary:     Effort: Pulmonary effort is normal. No respiratory distress.  Breath sounds: Normal breath sounds. No wheezing.  Abdominal:     General: There is no distension.     Palpations: Abdomen is soft.     Tenderness: There is no abdominal tenderness.  Musculoskeletal:        General: Normal range of motion.     Cervical back: Normal range of motion and neck supple.  Skin:    General: Skin is warm and dry.     Findings: No erythema.  Neurological:     Mental Status: She is alert and oriented to person, place, and time. Mental status is at baseline.     Motor: No abnormal muscle tone.  Psychiatric:        Mood and Affect: Mood and affect normal.    Breast exam was performed in seated and lying down position. Patient is status post  lest breast mass biopsy with focal tissue swelling and bruising.   No palpable breast mass in right breast   No palpable axillary adenopathy bilaterally.   LABORATORY DATA:  I have reviewed the data as listed    Latest Ref Rng & Units 03/08/2024    4:16 PM 09/08/2023    8:36 AM 03/18/2019    2:53 AM  CBC  WBC 4.0 - 10.5 K/uL 5.6  5.0  6.7   Hemoglobin 12.0 - 15.0 g/dL 45.4  09.8  11.9   Hematocrit 36.0 - 46.0 % 39.9  44.9  36.6   Platelets 150 - 400 K/uL 217  199  202       Latest Ref Rng & Units 03/08/2024    4:16 PM 02/13/2024    8:31 AM 09/08/2023    8:36 AM  CMP  Glucose 70 - 99 mg/dL 147  829  562   BUN 6 - 20 mg/dL 13  16  19    Creatinine 0.44 - 1.00 mg/dL 1.30  8.65  7.84   Sodium 135 - 145 mmol/L 137  143  143   Potassium 3.5 - 5.1 mmol/L 3.5  4.3  4.6   Chloride 98 - 111  mmol/L 101  105  106   CO2 22 - 32 mmol/L 24  22  22    Calcium  8.9 - 10.3 mg/dL 9.5  9.6  9.7   Total Protein 6.5 - 8.1 g/dL 7.3  6.7  6.7   Total Bilirubin 0.0 - 1.2 mg/dL 0.6  0.4  0.4   Alkaline Phos 38 - 126 U/L 53  65  61   AST 15 - 41 U/L 27  19  17    ALT 0 - 44 U/L 40  28  24      RADIOGRAPHIC STUDIES: I have personally reviewed the radiological images as listed and agreed with the findings in the report. MM Breast Surgical Specimen Result Date: 03/17/2024 CLINICAL DATA:  Status post [facility] localized LEFT breast lumpectomy. Status breast ultrasound-guided biopsy carcinoma (venus) EXAM: SPECIMEN RADIOGRAPH OF THE LEFT BREAST COMPARISON:  Previous exam(s). FINDINGS: Status post excision of the LEFT breast. The [facility] reflector and VENUS shaped clip are present within the specimen. IMPRESSION: Specimen radiograph of the LEFT breast. Electronically Signed   By: Clancy Crimes M.D.   On: 03/17/2024 11:43   NM Sentinel Node Inj-No Rpt (Breast) Result Date: 03/17/2024 Lymphoseek was injected by the Nuclear Medicine Technologist for sentinel lymph node localization.   MM 3D DIAGNOSTIC MAMMOGRAM UNILATERAL LEFT BREAST Result Date: 03/11/2024 CLINICAL DATA:  Status post SAVI scout localization of biopsy-proven malignancy in the  LEFT breast (Venus clip) EXAM: DIAGNOSTIC LEFT MAMMOGRAM POST ULTRASOUND SAVI SCOUT LOCALIZATION COMPARISON:  Previous exam(s). ACR Breast Density Category c: The breasts are heterogeneously dense, which may obscure small masses. FINDINGS: Mammographic images were obtained following ultrasound SAVI scout localization of the left breast. The SAVI scout is expected position in the left breast 8 o'clock position, immediately adjacent to the Venus shaped biopsy marking clip. IMPRESSION: Appropriate positioning of the SAVI scout at the site of malignancy in the LEFT breast 8 o'clock position. Final Assessment: Post Procedure Mammograms for Marker Placement BI-RADS  CATEGORY  35M: Post-Procedure Mammogram for Marker Placement Electronically Signed   By: Sande Cromer M.D.   On: 03/11/2024 16:14   US  LT RADIO FREQUENCY TAG LOC US  GUIDE Result Date: 03/11/2024 CLINICAL DATA:  SAVI scout localization prior to LEFT breast lumpectomy for invasive ductal carcinoma (Venus clip). EXAM: NEEDLE LOCALIZATION OF THE LEFT BREAST WITH ULTRASOUND GUIDANCE COMPARISON:  Previous exam(s). FINDINGS: Patient presents for needle localization prior to left breast lumpectomy. I met with the patient and we discussed the procedure of needle localization including benefits and alternatives. We discussed the high likelihood of a successful procedure. We discussed the risks of the procedure, including infection, bleeding, tissue injury, and further surgery. Informed, written consent was given. The usual time-out protocol was performed immediately prior to the procedure. Using ultrasound guidance, sterile technique, 1% lidocaine  and a 7.5 cm SAVI SCOUT needle, the irregular hypoechoic mass in the left breast 8 o'clock position 8 cm from nipple was localized using an inferior approach. IMPRESSION: Radar reflector localization of the left breast. No apparent complications. Electronically Signed   By: Sande Cromer M.D.   On: 03/11/2024 16:10

## 2024-04-07 NOTE — Consult Note (Signed)
 NEW PATIENT EVALUATION  Name: Zoe [name]  MRN: [id number]  Date:   04/07/2024     DOB: 1966-11-27   This 58 y.o. 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  REFERRING PHYSICIAN: Scoggins, Hospital doctor, NP  CHIEF COMPLAINT:  Chief Complaint  Patient presents with   Breast Cancer    DIAGNOSIS: The encounter diagnosis was Invasive ductal carcinoma of breast, female, left (HCC).   PREVIOUS INVESTIGATIONS:  Mammogram and ultrasound reviewed Pathology reports reviewed Clinical notes reviewed  HPI: Patient is a 58 year old female who presented with an abnormal mammogram of her left breast.  There was an 8 mm mass at the site of screening mammography concerning and highly suspicious for malignancy.  No suspicious left axillary adenopathy was noted on ultrasound.  She underwent targeted biopsy which was positive for invasive mammary carcinoma ER positive.  She underwent wide local excision showing overall grade three 1.2 cm invasive mammary carcinoma.  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  Margins were clear for invasive component of 1 cm 0.1 mm for DCIS.  2 sentinel lymph nodes were examined both negative for malignancy.  Tumor was ER/PR positive HER2/neu not overexpressed.  She had an Oncotype DX performed with a recurrence risk of 28 and she will receive systemic chemotherapy.  She is seen today for radiation oncology evaluation she is doing well specifically denies breast tenderness cough or bone pain.  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    PAST SURGICAL HISTORY:  Past Surgical History:  Procedure Laterality Date   AXILLARY SENTINEL NODE BIOPSY Left 03/17/2024   Procedure: BIOPSY, LYMPH NODE, SENTINEL, AXILLARY;  Surgeon: Eldred Grego, MD;  Location: [facility];  Service: General;  Laterality: Left;   BREAST BIOPSY Left 03/03/2024   US  LT BREAST Bx venus marker,   8:00 8 cmfn : - INVASIVE DUCTAL CARCINOMA   BREAST BIOPSY Left 03/11/2024   US  LT RADIO FREQUENCY TAG LOC US  GUIDE 03/11/2024 [facility]   BREAST LUMPECTOMY Left 03/17/2024   Procedure: BREAST LUMPECTOMY;  Surgeon: Eldred Grego, MD;  Location: [facility];  Service: General;  Laterality: Left;  w/ RF tag   CARDIAC CATHETERIZATION     COLONOSCOPY  2001   COLONOSCOPY WITH PROPOFOL  N/A 11/05/2019   Procedure: COLONOSCOPY WITH PROPOFOL ;  Surgeon: Irby Mannan, MD;  Location: [facility];  Service: Endoscopy;  Laterality: N/A;   CORONARY ANGIOPLASTY     DILATATION & CURETTAGE/HYSTEROSCOPY WITH MYOSURE N/A 02/29/2016   Procedure: DILATATION & CURETTAGE/HYSTEROSCOPY WITH MYOSURE;  Surgeon: Margarie Shay Ward, MD;  Location: [facility];  Service: Gynecology;  Laterality: N/A;   DILATION AND CURETTAGE OF UTERUS     EAR CYST EXCISION Left 2015   IN OFFICE   ESOPHAGOGASTRODUODENOSCOPY     INTRAUTERINE DEVICE (IUD) INSERTION N/A 02/29/2016   Procedure: INTRAUTERINE DEVICE (IUD) INSERTION;  Surgeon: Margarie Shay Ward, MD;  Location: [facility];  Service: Gynecology;  Laterality: N/A;   LEFT HEART CATH AND CORONARY ANGIOGRAPHY Left 03/16/2019   Procedure: LEFT HEART CATH AND  CORONARY ANGIOGRAPHY;  Surgeon: Cherrie Cornwall, MD;  Location: [facility];  Service: Cardiovascular;  Laterality: Left;    FAMILY HISTORY: family history includes Diabetes in her brother and another family member; Heart disease in her father and paternal grandfather; Hyperlipidemia in her mother; Hypertension in her mother.  SOCIAL HISTORY:  reports that she has never smoked. She has never used smokeless tobacco. She reports current alcohol use of about 3.0 - 5.0 standard drinks of alcohol per week. She reports that she does not use drugs.  ALLERGIES: Ace inhibitors and Neosporin wound cleanser [benzalkonium chloride]  MEDICATIONS:  Current Outpatient Medications  Medication Sig Dispense Refill   aspirin  81 MG tablet Take 81 mg by mouth daily.     Cholecalciferol (VITAMIN D-3 PO) Take 2,000 Units by mouth daily.     clopidogrel  (PLAVIX ) 75 MG tablet TAKE 1 TABLET BY MOUTH EVERY DAY 90 tablet 1   Coenzyme Q10 (CO Q 10 PO) Take 1 capsule by mouth daily.     dexamethasone  (DECADRON ) 4 MG tablet Take 2 tabs by mouth 2 times daily starting day before chemo. Then take 2 tabs daily for 2 days starting day after chemo. Take with food. 30 tablet 1   FARXIGA 10 MG TABS tablet TAKE 1 TABLET BY MOUTH EVERY DAY IN THE MORNING 30 tablet 11   ibuprofen  (ADVIL ,MOTRIN ) 600 MG tablet Take 1 tablet (600 mg total) by mouth every 6 (six) hours as needed. 30 tablet 0   isosorbide  mononitrate (IMDUR ) 30 MG 24 hr tablet TAKE 1 TABLET BY MOUTH EVERY DAY 90 tablet 2   losartan-hydrochlorothiazide  (HYZAAR) 50-12.5 MG tablet TAKE 1 TABLET BY MOUTH EVERY DAY 90 tablet 0   metFORMIN  (GLUCOPHAGE ) 500 MG tablet TAKE 1 TABLET BY MOUTH 2 TIMES DAILY WITH A MEAL. 180 tablet 1   metoprolol  succinate (TOPROL -XL) 50 MG 24 hr tablet TAKE 1 TABLET BY MOUTH EVERY DAY 90 tablet 2   Omega-3 Fatty Acids (FISH OIL) 1200 MG CAPS Take 1,200 mg by mouth daily.     ondansetron  (ZOFRAN ) 8 MG tablet Take 1 tablet (8 mg total) by  mouth every 8 (eight) hours as needed for nausea or vomiting. Start on the third day after chemotherapy. 30 tablet 3   pantoprazole  (PROTONIX ) 40 MG tablet TAKE 1 TABLET BY MOUTH EVERY DAY 90 tablet 1   prochlorperazine  (COMPAZINE ) 10 MG tablet Take 1 tablet (10 mg total) by mouth every 6 (six) hours as needed for nausea or vomiting. 30 tablet 3   rosuvastatin  (CRESTOR ) 40 MG tablet TAKE 1 TABLET BY MOUTH EVERY DAY 90 tablet 1   No current facility-administered medications for this encounter.    ECOG PERFORMANCE STATUS:  0 - Asymptomatic  REVIEW OF SYSTEMS: Patient denies any weight loss, fatigue, weakness, fever, chills or night sweats. Patient denies any loss of vision, blurred vision. Patient denies any ringing  of the ears or hearing loss. No irregular heartbeat. Patient denies heart murmur or history of fainting. Patient denies any chest pain or pain radiating to her upper extremities. Patient denies any shortness of breath, difficulty breathing at night, cough or hemoptysis. Patient denies any swelling in the lower legs. Patient denies any nausea vomiting, vomiting of blood, or coffee ground material in the vomitus. Patient denies any stomach pain. Patient states has had normal bowel  movements no significant constipation or diarrhea. Patient denies any dysuria, hematuria or significant nocturia. Patient denies any problems walking, swelling in the joints or loss of balance. Patient denies any skin changes, loss of hair or loss of weight. Patient denies any excessive worrying or anxiety or significant depression. Patient denies any problems with insomnia. Patient denies excessive thirst, polyuria, polydipsia. Patient denies any swollen glands, patient denies easy bruising or easy bleeding. Patient denies any recent infections, allergies or URI. Patient "s visual fields have not changed significantly in recent time.   PHYSICAL EXAM: There were no vitals taken for this visit. She status post wide  local excision of the left breast.  Incision is well-healed.  No dominant masses noted in either breast.  No axillary or supraclavicular adenopathy is identified.  Well-developed well-nourished patient in NAD. HEENT reveals PERLA, EOMI, discs not visualized.  Oral cavity is clear. No oral mucosal lesions are identified. Neck is clear without evidence of cervical or supraclavicular adenopathy. Lungs are clear to A&P. Cardiac examination is essentially unremarkable with regular rate and rhythm without murmur rub or thrill. Abdomen is benign with no organomegaly or masses noted. Motor sensory and DTR levels are equal and symmetric in the upper and lower extremities. Cranial nerves II through XII are grossly intact. Proprioception is intact. No peripheral adenopathy or edema is identified. No motor or sensory levels are noted. Crude visual fields are within normal range.  LABORATORY DATA: Pathology reports reviewed    RADIOLOGY RESULTS: Mammogram and ultrasound reviewed compatible with above-stated findings   IMPRESSION: Stage Ia ER/PR positive base of mammary carcinoma left breast status post wide local excision and sentinel node biopsy with Oncotype DX of 28 will receive systemic chemotherapy followed by DIBH whole breast radiation to her left breast  PLAN: At this time I have recommended DIBH whole breast radiation and hypofractionated regimen over 3 weeks.  Would also boost her scar another 1600 centigrade using photon beam therapy based on the close DCIS margin.  Risks and benefits of treatment including skin reaction fatigue alteration of blood counts possible inclusion of superficial lung all were reviewed in detail with the patient.  Will also ask her to do breath-hold to spare cardiac involvement.  We will see her again in follow-up after completion of chemotherapy.  Patient also would benefit be benefit from endocrine therapy after completion of radiation.  Patient comprehends my recommendations  well.  I would like to take this opportunity to thank you for allowing me to participate in the care of your patient.Glenis Langdon, MD

## 2024-04-07 NOTE — Progress Notes (Signed)
 Pharmacist Chemotherapy Monitoring - Initial Assessment    Anticipated start date: 04/21/24   The following has been reviewed per standard work regarding the patient's treatment regimen: The patient's diagnosis, treatment plan and drug doses, and organ/hematologic function Lab orders and baseline tests specific to treatment regimen  The treatment plan start date, drug sequencing, and pre-medications Prior authorization status  Patient's documented medication list, including drug-drug interaction screen and prescriptions for anti-emetics and supportive care specific to the treatment regimen The drug concentrations, fluid compatibility, administration routes, and timing of the medications to be used The patient's access for treatment and lifetime cumulative dose history, if applicable  The patient's medication allergies and previous infusion related reactions, if applicable   Changes made to treatment plan:  Given her history of cardiovascular disease including CAD, NSTEMI, non anthracycline regimen with TC Q3 weeks x 4 cycles   Follow up needed:     Emaline Handsome, RPH, 04/07/2024  2:29 PM

## 2024-04-08 ENCOUNTER — Other Ambulatory Visit: Payer: Self-pay

## 2024-04-13 ENCOUNTER — Inpatient Hospital Stay

## 2024-04-13 DIAGNOSIS — C50912 Malignant neoplasm of unspecified site of left female breast: Secondary | ICD-10-CM

## 2024-04-13 NOTE — Progress Notes (Signed)
 [facility] CSW Progress Note  Clinical Social Work introduced self to patient during Patient Education with Octavio Ben, Charity fundraiser.  Provided information regarding CSW role, including counseling, advanced care planning and support group.  Answered questions as needed.  Kennth Peal, LCSW Clinical Social Worker [facility]

## 2024-04-14 ENCOUNTER — Inpatient Hospital Stay: Admitting: Licensed Clinical Social Worker

## 2024-04-14 NOTE — Progress Notes (Signed)
 [facility] Psychosocial Distress Screening Clinical Social Work  Clinical Social Work was referred by distress screening protocol.  The patient scored a 6 on the Psychosocial Distress Thermometer which indicates moderate distress. Clinical Social Worker contacted patient by phone to assess for distress and other psychosocial needs. Patient is concerned about her health insurance, including a supplemental cancer policy.  Patient to email required documents and CSW will attempt to assist in obtaining.    Distress Screen:    04/13/2024    1:26 PM  ONCBCN DISTRESS SCREENING  Distress experienced in past week (1-10) 6  Practical problem type Insurance  Emotional problem type Adjusting to illness    CSW and patient discussed common feeling and emotions when being diagnosed with cancer, and the importance of support during treatment.  CSW informed patient of the support team and support services at [facility].  CSW provided contact information and encouraged patient to call with any questions or concerns.  Clinical Social Worker follow up needed: Yes.    If yes, follow up plan:  Freya Jesus Kyce Ging, LCSW

## 2024-04-16 ENCOUNTER — Encounter: Payer: Self-pay | Admitting: Oncology

## 2024-04-21 ENCOUNTER — Inpatient Hospital Stay: Attending: Oncology | Admitting: Hospice and Palliative Medicine

## 2024-04-21 ENCOUNTER — Encounter: Payer: Self-pay | Admitting: *Deleted

## 2024-04-21 ENCOUNTER — Encounter: Payer: Self-pay | Admitting: Oncology

## 2024-04-21 ENCOUNTER — Inpatient Hospital Stay

## 2024-04-21 ENCOUNTER — Inpatient Hospital Stay: Admitting: Oncology

## 2024-04-21 VITALS — BP 121/72 | HR 66 | Resp 16

## 2024-04-21 VITALS — BP 139/95 | HR 77 | Temp 97.7°F | Resp 16 | Wt 159.0 lb

## 2024-04-21 DIAGNOSIS — Z5111 Encounter for antineoplastic chemotherapy: Secondary | ICD-10-CM | POA: Insufficient documentation

## 2024-04-21 DIAGNOSIS — E1165 Type 2 diabetes mellitus with hyperglycemia: Secondary | ICD-10-CM | POA: Diagnosis not present

## 2024-04-21 DIAGNOSIS — Z79633 Long term (current) use of mitotic inhibitor: Secondary | ICD-10-CM | POA: Insufficient documentation

## 2024-04-21 DIAGNOSIS — Z794 Long term (current) use of insulin: Secondary | ICD-10-CM | POA: Diagnosis not present

## 2024-04-21 DIAGNOSIS — Z170 Estrogen receptor positive status [ER+]: Secondary | ICD-10-CM | POA: Insufficient documentation

## 2024-04-21 DIAGNOSIS — Z5189 Encounter for other specified aftercare: Secondary | ICD-10-CM | POA: Diagnosis not present

## 2024-04-21 DIAGNOSIS — C50912 Malignant neoplasm of unspecified site of left female breast: Secondary | ICD-10-CM

## 2024-04-21 DIAGNOSIS — Z79630 Long term (current) use of alkylating agent: Secondary | ICD-10-CM | POA: Insufficient documentation

## 2024-04-21 DIAGNOSIS — C50812 Malignant neoplasm of overlapping sites of left female breast: Secondary | ICD-10-CM | POA: Insufficient documentation

## 2024-04-21 LAB — CMP (CANCER CENTER ONLY)
ALT: 27 U/L (ref 0–44)
AST: 19 U/L (ref 15–41)
Albumin: 4.3 g/dL (ref 3.5–5.0)
Alkaline Phosphatase: 60 U/L (ref 38–126)
Anion gap: 14 (ref 5–15)
BUN: 17 mg/dL (ref 6–20)
CO2: 19 mmol/L — ABNORMAL LOW (ref 22–32)
Calcium: 9.7 mg/dL (ref 8.9–10.3)
Chloride: 106 mmol/L (ref 98–111)
Creatinine: 0.67 mg/dL (ref 0.44–1.00)
GFR, Estimated: >60 mL/min (ref >60)
Glucose, Bld: 240 mg/dL — ABNORMAL HIGH (ref 70–99)
Potassium: 3.8 mmol/L (ref 3.5–5.1)
Sodium: 139 mmol/L (ref 135–145)
Total Bilirubin: 0.9 mg/dL (ref 0.0–1.2)
Total Protein: 7.3 g/dL (ref 6.5–8.1)

## 2024-04-21 LAB — CBC WITH DIFFERENTIAL (CANCER CENTER ONLY)
Abs Immature Granulocytes: 0.04 10*3/uL (ref 0.00–0.07)
Basophils Absolute: 0 10*3/uL (ref 0.0–0.1)
Basophils Relative: 0 %
Eosinophils Absolute: 0 10*3/uL (ref 0.0–0.5)
Eosinophils Relative: 0 %
HCT: 40.4 % (ref 36.0–46.0)
Hemoglobin: 13.5 g/dL (ref 12.0–15.0)
Immature Granulocytes: 0 %
Lymphocytes Relative: 6 %
Lymphs Abs: 0.6 10*3/uL — ABNORMAL LOW (ref 0.7–4.0)
MCH: 27.2 pg (ref 26.0–34.0)
MCHC: 33.4 g/dL (ref 30.0–36.0)
MCV: 81.5 fL (ref 80.0–100.0)
Monocytes Absolute: 0.2 10*3/uL (ref 0.1–1.0)
Monocytes Relative: 2 %
Neutro Abs: 9.3 10*3/uL — ABNORMAL HIGH (ref 1.7–7.7)
Neutrophils Relative %: 92 %
Platelet Count: 218 10*3/uL (ref 150–400)
RBC: 4.96 MIL/uL (ref 3.87–5.11)
RDW: 13.7 % (ref 11.5–15.5)
WBC Count: 10.2 10*3/uL (ref 4.0–10.5)
nRBC: 0 % (ref 0.0–0.2)

## 2024-04-21 MED ORDER — CYCLOPHOSPHAMIDE CHEMO INJECTION 1 GM
600.0000 mg/m2 | Freq: Once | INTRAMUSCULAR | Status: AC
  Administered 2024-04-21: 1000 mg via INTRAVENOUS
  Filled 2024-04-21: qty 50

## 2024-04-21 MED ORDER — SODIUM CHLORIDE 0.9 % IV SOLN
75.0000 mg/m2 | Freq: Once | INTRAVENOUS | Status: AC
  Administered 2024-04-21: 137 mg via INTRAVENOUS
  Filled 2024-04-21: qty 13.7

## 2024-04-21 MED ORDER — DEXAMETHASONE SODIUM PHOSPHATE 10 MG/ML IJ SOLN
10.0000 mg | Freq: Once | INTRAMUSCULAR | Status: AC
  Administered 2024-04-21: 10 mg via INTRAVENOUS
  Filled 2024-04-21: qty 1

## 2024-04-21 MED ORDER — SODIUM CHLORIDE 0.9 % IV SOLN
INTRAVENOUS | Status: DC
  Filled 2024-04-21: qty 250

## 2024-04-21 MED ORDER — PALONOSETRON HCL INJECTION 0.25 MG/5ML
0.2500 mg | Freq: Once | INTRAVENOUS | Status: AC
Start: 2024-04-21 — End: 2024-04-21
  Administered 2024-04-21: 0.25 mg via INTRAVENOUS
  Filled 2024-04-21: qty 5

## 2024-04-21 NOTE — Patient Instructions (Signed)
 [facility] - A DEPT OF [facility]  Discharge Instructions: Thank you for choosing [facility] to provide your oncology and hematology care.  If you have a lab appointment with the Cancer Center, please go directly to the Cancer Center and check in at the registration area.  Wear comfortable clothing and clothing appropriate for easy access to any Portacath or PICC line.   We strive to give you quality time with your provider. You may need to reschedule your appointment if you arrive late (15 or more minutes).  Arriving late affects you and other patients whose appointments are after yours.  Also, if you miss three or more appointments without notifying the office, you may be dismissed from the clinic at the provider's discretion.      For prescription refill requests, have your pharmacy contact our office and allow 72 hours for refills to be completed.    Today you received the following chemotherapy and/or immunotherapy agents Taxotere & Cytoxan      To help prevent nausea and vomiting after your treatment, we encourage you to take your nausea medication as directed.  BELOW ARE SYMPTOMS THAT SHOULD BE REPORTED IMMEDIATELY: *FEVER GREATER THAN 100.4 F (38 C) OR HIGHER *CHILLS OR SWEATING *NAUSEA AND VOMITING THAT IS NOT CONTROLLED WITH YOUR NAUSEA MEDICATION *UNUSUAL SHORTNESS OF BREATH *UNUSUAL BRUISING OR BLEEDING *URINARY PROBLEMS (pain or burning when urinating, or frequent urination) *BOWEL PROBLEMS (unusual diarrhea, constipation, pain near the anus) TENDERNESS IN MOUTH AND THROAT WITH OR WITHOUT PRESENCE OF ULCERS (sore throat, sores in mouth, or a toothache) UNUSUAL RASH, SWELLING OR PAIN  UNUSUAL VAGINAL DISCHARGE OR ITCHING   Items with * indicate a potential emergency and should be followed up as soon as possible or go to the Emergency Department if any problems should occur.  Please show the CHEMOTHERAPY ALERT CARD or  IMMUNOTHERAPY ALERT CARD at check-in to the Emergency Department and triage nurse.  Should you have questions after your visit or need to cancel or reschedule your appointment, please contact [facility] - A DEPT OF [facility]  [phone] and follow the prompts.  Office hours are 8:00 a.m. to 4:30 p.m. Monday - Friday. Please note that voicemails left after 4:00 p.m. may not be returned until the following business day.  We are closed weekends and major holidays. You have access to a nurse at all times for urgent questions. Please call the main number to the clinic [phone] and follow the prompts.  For any non-urgent questions, you may also contact your provider using MyChart. We now offer e-Visits for anyone 83 and older to request care online for non-urgent symptoms. For details visit [email or web address].   Also download the MyChart app! Go to the app store, search "MyChart", open the app, select [facility], and log in with your MyChart username and password.

## 2024-04-21 NOTE — Assessment & Plan Note (Signed)
 Treatment plan as listed above.

## 2024-04-21 NOTE — Progress Notes (Signed)
 Multidisciplinary Oncology Council Documentation  Zoe [name] was presented by our [facility] on 04/21/2024, which included representatives from:  Palliative Care Dietitian  Physical/Occupational Therapist Nurse Navigator Genetics Social work Survivorship RN Financial Navigator Research RN   Zoe [name] currently presents with history of breast ca  We reviewed previous medical and familial history, history of present illness, and recent lab results along with all available histopathologic and imaging studies. The MOC considered available treatment options and made the following recommendations/referrals:  SW, genetics, nutrition, rehab screening  The MOC is a meeting of clinicians from various specialty areas who evaluate and discuss patients for whom a multidisciplinary approach is being considered. Final determinations in the plan of care are those of the provider(s).   Today's extended care, comprehensive team conference, Zoe [name] was not present for the discussion and was not examined.

## 2024-04-21 NOTE — Assessment & Plan Note (Addendum)
 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  Recommend adjuvant chemotherapy.  Given her history of cardiovascular disease including CAD, NSTEMI, I recommend non anthracycline regimen with TC Q3 weeks x 4 cycles.  Labs are reviewed and discussed with patient. Proceed with cycle 1 TC with D3 GCSF.  Side effects of GCSF was discussed. Recommend claritin 10mg  daily x 4 days.  We discussed about instruction of dexamethasone , antiemetics.

## 2024-04-21 NOTE — Assessment & Plan Note (Signed)
 Recommend diabetic diet and monitor blood glucose.  Anticipate higher glucose level due to steroid use.  Follow up with PCP for management. On Metformin  and Farxiga

## 2024-04-21 NOTE — Progress Notes (Signed)
 Nutrition Assessment   Reason for Assessment:   New chemo start   ASSESSMENT:  58 year old female with invasive left breast cancer.  ER/PR positive, HER2 negative.  Oncotype score 28.  Past medical history of DM, CAD, GERD, HTN, NSTEMI.  Starting docetaxel and cytoxan today for 4 cycles.   Met with patient during infusion.  Reports that her appetite is normal.  Blood glucose elevated today likely due to taking steroid this am and history of DM.     Medications: compazine , CoQ10, ondansetron , metformin , protonix , omega 3 fatty acids, decadron , Vit D   Labs: glucose 240   Anthropometrics:   Height: 64 inches Weight: 159 lb today 4/2 165 lb  168 lb on 2/20 BMI: 27  5% weight loss in the last 3 months, not significant   NUTRITION DIAGNOSIS:  Food and nutrition related knowledge deficit related to new diagnosis of cancer as evidenced by possible nutrition impacts symptoms from treatment    INTERVENTION:  Discussed possible nutrition impact symptoms from treatment.  Provided patient with handout on Nutrition During Cancer Treatment Contact information provided   MONITORING, EVALUATION, GOAL: weight trends, intake   Next Visit: Wednesday, May 28 during infusion  Kolbee Stallman B. Zollie Hipp, CSO, LDN Registered Dietitian [phone]

## 2024-04-21 NOTE — Progress Notes (Signed)
 Hematology/Oncology Progress note Telephone:([phone]) [phone] Fax:(336) [phone]      CHIEF COMPLAINTS/PURPOSE OF CONSULTATION:  Left breast cancer  ASSESSMENT & PLAN:   Cancer Staging  Invasive ductal carcinoma of breast, female, left (HCC) Staging form: Breast, AJCC 8th Edition - Clinical stage from 03/08/2024: Stage IA (cT1b, cN0, cM0, G2, ER+, PR+, HER2-) - Signed by Timmy Forbes, MD on 03/08/2024 - Pathologic stage from 04/07/2024: Stage IA (pT1c, pN0, cM0, G3, ER+, PR+, HER2-, Oncotype DX score: 28) - Signed by Timmy Forbes, MD on 04/07/2024   Invasive ductal carcinoma of breast, female, left (HCC) Left breast invasive ductal carcinoma, pT1b cN0 ER 99%+/PR 30%+, HER2 low (1+) Pathology and radiology counseling: Oncotype DX RS 28, >15% chemotherapy benefit.  Recommend adjuvant chemotherapy.  Given her history of cardiovascular disease including CAD, NSTEMI, I recommend non anthracycline regimen with TC Q3 weeks x 4 cycles.  Labs are reviewed and discussed with patient. Proceed with cycle 1 TC with D3 GCSF.  Side effects of GCSF was discussed. Recommend claritin 10mg  daily x 4 days.  We discussed about instruction of dexamethasone , antiemetics.   Encounter for antineoplastic chemotherapy Treatment plan as listed above.   Type 2 diabetes mellitus with hyperglycemia, without long-term current use of insulin (HCC) Recommend diabetic diet and monitor blood glucose.  Anticipate higher glucose level due to steroid use.  Follow up with PCP for management. On Metformin  and Farxiga    Orders Placed This Encounter  Procedures   CBC with Differential (Cancer Center Only)    Standing Status:   Future    Expected Date:   04/28/2024    Expiration Date:   04/21/2025   CMP (Cancer Center only)    Standing Status:   Future    Expected Date:   04/28/2024    Expiration Date:   04/21/2025   Follow up 1 week All questions were answered. The patient knows to call the clinic with any problems, questions or  concerns. We spent sufficient time to discuss many aspect of care, questions were answered to patient's satisfaction.   Timmy Forbes, MD, PhD [facility] Hematology Oncology 04/21/2024    HISTORY OF PRESENTING ILLNESS:  Zoe [name] 58 y.o. female presents to establish care for left breast cancer I have reviewed her chart and materials related to her cancer extensively and collaborated history with the patient. Summary of oncologic history is as follows: Oncology History  Invasive ductal carcinoma of breast, female, left (HCC)  02/17/2024 Mammogram   Bilateral screening mammogram showed  FINDINGS: In the left breast, a possible mass warrants further evaluation. In the right breast, no findings suspicious for malignancy.   IMPRESSION: Further evaluation is suggested for a possible mass in the left breast.    02/24/2024 Mammogram   Unilateral left breast diagnostic mammogram  1. There is an 8 mm mass at the site of screening mammographic concern which is highly suspicious for malignancy. Recommend ultrasound-guided biopsy for definitive characterization. 2. No suspicious LEFT axillary adenopathy.   03/08/2024 Initial Diagnosis   Invasive ductal carcinoma of breast, female, left   Patient underwent left breast mass biopsy Pathology showed  1. Breast, left, needle core biopsy, 8 o'clock, 8cmfn :       - INVASIVE DUCTAL CARCINOMA, GRADE 2, EXTENDING TO 0.6 CM IN MAXIMUM EXTENT, AND       INVOLVING TWO OF TWO BIOPSY FRAGMENTS.  SEE COMMENT.       - TUBULE FORMATION: SCORE 3       - NUCLEAR  PLEOMORPHISM: SCORE 2       - MITOTIC COUNT: SCORE 1       - TOTAL SCORE: 6       - OVERALL GRADE:  2       - LYMPHOVASCULAR INVASION: NOT IDENTIFIED       - CANCER LENGTH: 6 MM       - CALCIFICATIONS: NOT IDENTIFIED       - LYMPHOVASCULAR INVASION: NOT IDENTIFIED.   ER + 99% PR + 30%, Her2  negative (1+)    Menarche at age of 67 First live birth at age of 4 OCP use: >5 years, then  switched to IUD History of hysterectomy: no Menopausal status: unsure History of HRT use: No History of chest radiation: no  Number of previous breast biopsies:  no  Denies family history of cancer, except that her father's cousin has breast cancer.    03/08/2024 Cancer Staging   Staging form: Breast, AJCC 8th Edition - Clinical stage from 03/08/2024: Stage IA (cT1b, cN0, cM0, G2, ER+, PR+, HER2-) - Signed by Timmy Forbes, MD on 03/08/2024 Stage prefix: Initial diagnosis Histologic grading system: 3 grade system   03/17/2024 Surgery   Patient underwent left breast lumpectomy and SLNB  1. Breast, lumpectomy, left breast mass :       - INVASIVE MAMMARY CARCINOMA OF NO SPECIAL TYPE (DUCTAL).       - DUCTAL CARCINOMA IN SITU (DCIS).       - SEE CANCER SUMMARY BELOW.       - PRIOR BIOPSY SITE CHANGE WITH CLIP.       - SCOUT TAG IS PRESENT.        2. Lymph node, sentinel, biopsy, left axillary sentinel lymph node #1 :       - ONE LYMPH NODE NEGATIVE FOR MALIGNANCY (0/1).        3. Lymph node, sentinel, biopsy, left axillary sentinel lymph node #2 :       - ONE LYMPH NODE NEGATIVE FOR MALIGNANCY (0/1).    TUMOR Histologic Type: Invasive carcinoma of no special type (ductal) Histologic Grade (Nottingham Histologic Score) Glandular (Acinar)/Tubular Differentiation: 3 Nuclear Pleomorphism: 2 Mitotic Rate: 3 Overall Grade: 3 Tumor Size: Greatest dimension of largest invasive focus: 12 mm Ductal Carcinoma In Situ (DCIS): Present, intermediate to high-grade Tumor Extent: Not applicable Lymphatic and/or Vascular Invasion: Not identified Treatment Effect in the Breast: No known presurgical therapy  MARGINS Margin Status for Invasive Carcinoma: All margins negative for invasive carcinoma Distance from closest margin: 10 mm Specify closest margin: Anterior Margin Status for DCIS: All margins negative for DCIS Distance from DCIS to closest margin: 0.1 mm Specify closest margin: Anterior                       REGIONAL LYMPH NODES Regional Lymph Node Status: All regional lymph nodes negative for tumor Total Number of Lymph Nodes Examined (sentinel and non-sentinel): 2 Number of Sentinel Nodes Examined: 2  PATHOLOGIC STAGE CLASSIFICATION (pTNM, AJCC 8th Edition):  Modified Classification: Not applicable  pT Category: pT1c  T Suffix: Not applicable  pN Category: pN0  N Suffix: (sn)  pM Category: Not applicable     04/07/2024 Cancer Staging   Staging form: Breast, AJCC 8th Edition - Pathologic stage from 04/07/2024: Stage IA (pT1c, pN0, cM0, G3, ER+, PR+, HER2-, Oncotype DX score: 28) - Signed by Timmy Forbes, MD on 04/07/2024 Stage prefix: Initial diagnosis Multigene prognostic tests performed: Oncotype DX Recurrence  score range: Greater than or equal to 11 Histologic grading system: 3 grade system   04/21/2024 -  Chemotherapy   Patient is on Treatment Plan : BREAST TC q21d      She underwent left breast lumpectomy and SLNB.  Today she presents for chemo evaluation.    MEDICAL HISTORY:  Past Medical History:  Diagnosis Date   Allergic rhinitis    Asthma    Complication of anesthesia    a.) delayed/prolonged emergence   Coronary artery disease    Dysplastic nevi 08/30/2020   Right lateral distal deltoid, left lower back post waistline. 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   T2DM (type 2 diabetes  mellitus) (HCC)    Unstable angina (HCC)     SURGICAL HISTORY: Past Surgical History:  Procedure Laterality Date   AXILLARY SENTINEL NODE BIOPSY Left 03/17/2024   Procedure: BIOPSY, LYMPH NODE, SENTINEL, AXILLARY;  Surgeon: Eldred Grego, MD;  Location: [facility];  Service: General;  Laterality: Left;   BREAST BIOPSY Left 03/03/2024   US  LT BREAST Bx venus marker,   8:00 8 cmfn : - INVASIVE DUCTAL CARCINOMA   BREAST BIOPSY Left 03/11/2024   US  LT RADIO FREQUENCY TAG LOC US  GUIDE 03/11/2024 [facility]   BREAST LUMPECTOMY Left 03/17/2024   Procedure: BREAST LUMPECTOMY;  Surgeon: Eldred Grego, MD;  Location: [facility];  Service: General;  Laterality: Left;  w/ RF tag   CARDIAC CATHETERIZATION     COLONOSCOPY  2001   COLONOSCOPY WITH PROPOFOL  N/A 11/05/2019   Procedure: COLONOSCOPY WITH PROPOFOL ;  Surgeon: Irby Mannan, MD;  Location: [facility];  Service: Endoscopy;  Laterality: N/A;   CORONARY ANGIOPLASTY     DILATATION & CURETTAGE/HYSTEROSCOPY WITH MYOSURE N/A 02/29/2016   Procedure: DILATATION & CURETTAGE/HYSTEROSCOPY WITH MYOSURE;  Surgeon: Margarie Shay Ward, MD;  Location: [facility];  Service: Gynecology;  Laterality: N/A;   DILATION AND CURETTAGE OF UTERUS     EAR CYST EXCISION Left 2015   IN OFFICE   ESOPHAGOGASTRODUODENOSCOPY     INTRAUTERINE DEVICE (IUD) INSERTION N/A 02/29/2016   Procedure: INTRAUTERINE DEVICE (IUD) INSERTION;  Surgeon: Margarie Shay Ward, MD;  Location: [facility];  Service: Gynecology;  Laterality: N/A;   LEFT HEART CATH AND CORONARY ANGIOGRAPHY Left 03/16/2019   Procedure: LEFT HEART CATH AND CORONARY ANGIOGRAPHY;  Surgeon: Cherrie Cornwall, MD;  Location: [facility];  Service: Cardiovascular;  Laterality: Left;    SOCIAL HISTORY: Social History   Socioeconomic History   Marital status: Married    Spouse name: Delude,Bobby T (Spouse)   Number of children: 2   Years of education: 18   Highest education level: Not on file   Occupational History   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  Other Topics Concern   Not on file  Social History Narrative   Not on file   Social Drivers of Health   Financial Resource Strain: Low Risk  (03/09/2024)   Received from [facility]   Overall Financial Resource Strain (CARDIA)    Difficulty of Paying Living Expenses: Not hard at all  Food Insecurity: No Food Insecurity (03/09/2024)   Received from [facility]   Hunger Vital Sign    Worried About Running Out of Food in the Last Year: Never true    Ran Out of Food in the Last Year: Never true  Transportation Needs: No Transportation Needs (03/09/2024)   Received from [facility] - Transportation    In the past 12 months, has lack of transportation kept you from medical appointments or from getting medications?: No    Lack of Transportation (Non-Medical): No  Physical Activity: Not on file  Stress: Not on file  Social Connections: Not on file  Intimate Partner Violence: Not At Risk (03/08/2024)   Humiliation, Afraid, Rape, and Kick questionnaire    Fear of Current or Ex-Partner: No    Emotionally Abused: No    Physically Abused: No    Sexually Abused: No    FAMILY HISTORY: Family History  Problem Relation Age of Onset   Hyperlipidemia Mother    Hypertension Mother    Heart disease Father        died from either MI or stroke   Diabetes Brother        TYPE 2   Heart disease Paternal Grandfather    Diabetes Other        TYPE 2   Cancer Neg Hx    Breast cancer Neg Hx      ALLERGIES:  is allergic to ace inhibitors and neosporin wound cleanser [benzalkonium chloride].  MEDICATIONS:  Current Outpatient Medications  Medication Sig Dispense Refill   aspirin  81 MG tablet Take 81 mg by mouth daily.     Cholecalciferol (VITAMIN D-3 PO) Take 2,000 Units by mouth daily.     clopidogrel  (PLAVIX ) 75 MG tablet TAKE 1 TABLET BY MOUTH EVERY DAY 90 tablet 1   Coenzyme Q10 (CO Q 10 PO) Take 1 capsule by mouth daily.     dexamethasone  (DECADRON ) 4 MG tablet Take 2 tabs by mouth 2 times daily starting day before chemo. Then take 2 tabs daily for 2 days starting day after chemo. Take with food. 30 tablet 1   FARXIGA 10 MG TABS tablet TAKE 1 TABLET BY MOUTH EVERY DAY IN THE MORNING 30 tablet 11   ibuprofen  (ADVIL ,MOTRIN ) 600 MG tablet Take 1 tablet (600 mg total) by mouth every 6 (six) hours as needed. 30 tablet 0   isosorbide  mononitrate (IMDUR ) 30 MG 24 hr tablet TAKE 1 TABLET BY MOUTH EVERY DAY 90 tablet 2   losartan-hydrochlorothiazide  (HYZAAR) 50-12.5 MG tablet TAKE 1 TABLET BY MOUTH EVERY DAY 90 tablet 0   metFORMIN  (GLUCOPHAGE ) 500 MG tablet TAKE 1 TABLET BY MOUTH 2 TIMES DAILY WITH A MEAL. 180 tablet 1   metoprolol  succinate (TOPROL -XL) 50 MG 24 hr  tablet TAKE 1 TABLET BY MOUTH EVERY DAY 90 tablet 2   Omega-3 Fatty Acids (FISH OIL) 1200 MG CAPS Take 1,200 mg by mouth daily.     ondansetron  (ZOFRAN ) 8 MG tablet Take 1 tablet (8 mg total) by mouth every 8 (eight) hours as needed for nausea or vomiting. Start on the third day after chemotherapy. 30 tablet 3   pantoprazole  (PROTONIX ) 40 MG tablet TAKE 1 TABLET BY MOUTH EVERY DAY 90 tablet 1   prochlorperazine  (COMPAZINE ) 10 MG tablet Take 1 tablet (10 mg total) by mouth every 6 (six) hours as needed for nausea or vomiting. 30 tablet 3   rosuvastatin  (CRESTOR ) 40 MG tablet TAKE 1 TABLET BY MOUTH EVERY DAY 90 tablet 1   No current facility-administered medications for this visit.   Facility-Administered Medications Ordered  in Other Visits  Medication Dose Route Frequency Provider Last Rate Last Admin   0.9 %  sodium chloride  infusion   Intravenous Continuous Timmy Forbes, MD   Stopped at 04/21/24 1249    Review of Systems  Constitutional:  Negative for appetite change, chills, fatigue and fever.  HENT:   Negative for hearing loss and voice change.   Eyes:  Negative for eye problems.  Respiratory:  Negative for chest tightness and cough.   Cardiovascular:  Negative for chest pain.  Gastrointestinal:  Negative for abdominal distention, abdominal pain and blood in stool.  Endocrine: Negative for hot flashes.  Genitourinary:  Negative for difficulty urinating and frequency.   Musculoskeletal:  Negative for arthralgias.  Skin:  Negative for itching and rash.  Neurological:  Negative for extremity weakness.  Hematological:  Negative for adenopathy.  Psychiatric/Behavioral:  Negative for confusion.      PHYSICAL EXAMINATION: ECOG PERFORMANCE STATUS: 0 - Asymptomatic  Vitals:   04/21/24 0852  BP: (!) 139/95  Pulse: 77  Resp: 16  Temp: 97.7 F (36.5 C)  SpO2: 96%   Filed Weights   04/21/24 0852  Weight: 159 lb (72.1 kg)    Physical Exam Constitutional:      General: She is not in acute distress.    Appearance: She is not diaphoretic.  HENT:     Head: Normocephalic and atraumatic.     Nose: Nose normal.     Mouth/Throat:     Pharynx: No oropharyngeal exudate.  Eyes:     General: No scleral icterus.    Pupils: Pupils are equal, round, and reactive to light.  Cardiovascular:     Rate and Rhythm: Normal rate and regular rhythm.     Heart sounds: No murmur heard. Pulmonary:     Effort: Pulmonary effort is normal. No respiratory distress.     Breath sounds: Normal breath sounds. No wheezing.  Abdominal:     General: There is no distension.     Palpations: Abdomen is soft.     Tenderness: There is no abdominal tenderness.  Musculoskeletal:        General: Normal range of motion.     Cervical  back: Normal range of motion and neck supple.  Skin:    General: Skin is warm and dry.     Findings: No erythema.  Neurological:     Mental Status: She is alert and oriented to person, place, and time. Mental status is at baseline.     Motor: No abnormal muscle tone.  Psychiatric:        Mood and Affect: Mood and affect normal.    Breast exam was performed in seated and  lying down position. Patient is status post  lest breast mass biopsy with focal tissue swelling and bruising.   No palpable breast mass in right breast   No palpable axillary adenopathy bilaterally.   LABORATORY DATA:  I have reviewed the data as listed    Latest Ref Rng & Units 04/21/2024    8:24 AM 03/08/2024    4:16 PM 09/08/2023    8:36 AM  CBC  WBC 4.0 - 10.5 K/uL 10.2  5.6  5.0   Hemoglobin 12.0 - 15.0 g/dL 40.9  81.1  91.4   Hematocrit 36.0 - 46.0 % 40.4  39.9  44.9   Platelets 150 - 400 K/uL 218  217  199       Latest Ref Rng & Units 04/21/2024    8:24 AM 03/08/2024    4:16 PM 02/13/2024    8:31 AM  CMP  Glucose 70 - 99 mg/dL 782  956  213   BUN 6 - 20 mg/dL 17  13  16    Creatinine 0.44 - 1.00 mg/dL 0.86  5.78  4.69   Sodium 135 - 145 mmol/L 139  137  143   Potassium 3.5 - 5.1 mmol/L 3.8  3.5  4.3   Chloride 98 - 111 mmol/L 106  101  105   CO2 22 - 32 mmol/L 19  24  22    Calcium  8.9 - 10.3 mg/dL 9.7  9.5  9.6   Total Protein 6.5 - 8.1 g/dL 7.3  7.3  6.7   Total Bilirubin 0.0 - 1.2 mg/dL 0.9  0.6  0.4   Alkaline Phos 38 - 126 U/L 60  53  65   AST 15 - 41 U/L 19  27  19    ALT 0 - 44 U/L 27  40  28      RADIOGRAPHIC STUDIES: I have personally reviewed the radiological images as listed and agreed with the findings in the report. No results found.

## 2024-04-22 ENCOUNTER — Telehealth: Payer: Self-pay

## 2024-04-22 ENCOUNTER — Encounter: Payer: Self-pay | Admitting: Oncology

## 2024-04-22 LAB — FOLLICLE STIMULATING HORMONE: FSH: 70.9 m[IU]/mL

## 2024-04-22 LAB — ESTRADIOL: Estradiol: 14.9 pg/mL

## 2024-04-22 NOTE — Telephone Encounter (Signed)
Telephone call to patient for follow up after receiving first infusion.   Patient states infusion went great.  States eating good and drinking plenty of fluids.   Denies any nausea or vomiting.  Encouraged patient to call for any concerns or questions. 

## 2024-04-23 ENCOUNTER — Inpatient Hospital Stay

## 2024-04-23 DIAGNOSIS — Z5111 Encounter for antineoplastic chemotherapy: Secondary | ICD-10-CM | POA: Diagnosis not present

## 2024-04-23 DIAGNOSIS — C50912 Malignant neoplasm of unspecified site of left female breast: Secondary | ICD-10-CM

## 2024-04-23 MED ORDER — PEGFILGRASTIM-JMDB 6 MG/0.6ML ~~LOC~~ SOSY
6.0000 mg | PREFILLED_SYRINGE | Freq: Once | SUBCUTANEOUS | Status: AC
  Administered 2024-04-23: 6 mg via SUBCUTANEOUS
  Filled 2024-04-23: qty 0.6

## 2024-04-28 ENCOUNTER — Inpatient Hospital Stay: Admitting: Oncology

## 2024-04-28 ENCOUNTER — Other Ambulatory Visit: Payer: Self-pay | Admitting: Licensed Clinical Social Worker

## 2024-04-28 ENCOUNTER — Inpatient Hospital Stay

## 2024-04-28 ENCOUNTER — Inpatient Hospital Stay (HOSPITAL_BASED_OUTPATIENT_CLINIC_OR_DEPARTMENT_OTHER): Admitting: Licensed Clinical Social Worker

## 2024-04-28 ENCOUNTER — Encounter: Payer: Self-pay | Admitting: Licensed Clinical Social Worker

## 2024-04-28 ENCOUNTER — Encounter: Payer: Self-pay | Admitting: Oncology

## 2024-04-28 VITALS — BP 114/73 | HR 101 | Temp 97.6°F | Wt 152.6 lb

## 2024-04-28 DIAGNOSIS — E1165 Type 2 diabetes mellitus with hyperglycemia: Secondary | ICD-10-CM

## 2024-04-28 DIAGNOSIS — C50912 Malignant neoplasm of unspecified site of left female breast: Secondary | ICD-10-CM | POA: Diagnosis not present

## 2024-04-28 DIAGNOSIS — Z5111 Encounter for antineoplastic chemotherapy: Secondary | ICD-10-CM | POA: Diagnosis not present

## 2024-04-28 LAB — CMP (CANCER CENTER ONLY)
ALT: 18 U/L (ref 0–44)
AST: 26 U/L (ref 15–41)
Albumin: 4.1 g/dL (ref 3.5–5.0)
Alkaline Phosphatase: 92 U/L (ref 38–126)
Anion gap: 14 (ref 5–15)
BUN: 20 mg/dL (ref 6–20)
CO2: 21 mmol/L — ABNORMAL LOW (ref 22–32)
Calcium: 9.4 mg/dL (ref 8.9–10.3)
Chloride: 100 mmol/L (ref 98–111)
Creatinine: 0.7 mg/dL (ref 0.44–1.00)
GFR, Estimated: >60 mL/min (ref >60)
Glucose, Bld: 211 mg/dL — ABNORMAL HIGH (ref 70–99)
Potassium: 3.5 mmol/L (ref 3.5–5.1)
Sodium: 135 mmol/L (ref 135–145)
Total Bilirubin: 1.1 mg/dL (ref 0.0–1.2)
Total Protein: 7.2 g/dL (ref 6.5–8.1)

## 2024-04-28 LAB — CBC WITH DIFFERENTIAL (CANCER CENTER ONLY)
Abs Immature Granulocytes: 2.51 10*3/uL — ABNORMAL HIGH (ref 0.00–0.07)
Basophils Absolute: 0 10*3/uL (ref 0.0–0.1)
Basophils Relative: 0 %
Eosinophils Absolute: 0 10*3/uL (ref 0.0–0.5)
Eosinophils Relative: 0 %
HCT: 44.4 % (ref 36.0–46.0)
Hemoglobin: 14.8 g/dL (ref 12.0–15.0)
Immature Granulocytes: 12 %
Lymphocytes Relative: 11 %
Lymphs Abs: 2.1 10*3/uL (ref 0.7–4.0)
MCH: 26.6 pg (ref 26.0–34.0)
MCHC: 33.3 g/dL (ref 30.0–36.0)
MCV: 79.7 fL — ABNORMAL LOW (ref 80.0–100.0)
Monocytes Absolute: 4.3 10*3/uL — ABNORMAL HIGH (ref 0.1–1.0)
Monocytes Relative: 21 %
Neutro Abs: 11.4 10*3/uL — ABNORMAL HIGH (ref 1.7–7.7)
Neutrophils Relative %: 56 %
Platelet Count: 169 10*3/uL (ref 150–400)
RBC: 5.57 MIL/uL — ABNORMAL HIGH (ref 3.87–5.11)
RDW: 13.2 % (ref 11.5–15.5)
Smear Review: NORMAL
WBC Count: 20.4 10*3/uL — ABNORMAL HIGH (ref 4.0–10.5)
nRBC: 0.2 % (ref 0.0–0.2)

## 2024-04-28 NOTE — Assessment & Plan Note (Addendum)
 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  Recommend adjuvant chemotherapy.  Given her history of cardiovascular disease including CAD, NSTEMI, I recommend non anthracycline regimen with TC Q3 weeks x 4 cycles.  Labs are reviewed and discussed with patient. S/p cycle 1 TC with D3 GCSF.  She tolerates well.

## 2024-04-28 NOTE — Assessment & Plan Note (Signed)
 Recommend diabetic diet and monitor blood glucose.  higher glucose level due to steroid use. Recommend to decrease the Dexamethasone  dosage to 4mg  daily for 2 days after chemotherapy.  Follow up with PCP for management. On Metformin  and Farxiga

## 2024-04-28 NOTE — Progress Notes (Signed)
 Hematology/Oncology Progress note [phone] [phone]      CHIEF COMPLAINTS/PURPOSE OF CONSULTATION:  Left breast cancer  ASSESSMENT & PLAN:   Cancer Staging  Invasive ductal carcinoma of breast, female, left (HCC) Staging form: Breast, AJCC 8th Edition - Clinical stage from 03/08/2024: Stage IA (cT1b, cN0, cM0, G2, ER+, PR+, HER2-) - Signed by Timmy Forbes, MD on 03/08/2024 - Pathologic stage from 04/07/2024: Stage IA (pT1c, pN0, cM0, G3, ER+, PR+, HER2-, Oncotype DX score: 28) - Signed by Timmy Forbes, MD on 04/07/2024   Invasive ductal carcinoma of breast, female, left (HCC) Left breast invasive ductal carcinoma, pT1b cN0 ER 99%+/PR 30%+, HER2 low (1+) Pathology and radiology counseling: Oncotype DX RS 28, >15% chemotherapy benefit.  Recommend adjuvant chemotherapy.  Given her history of cardiovascular disease including CAD, NSTEMI, I recommend non anthracycline regimen with TC Q3 weeks x 4 cycles.  Labs are reviewed and discussed with patient. S/p cycle 1 TC with D3 GCSF.  She tolerates well.    Type 2 diabetes mellitus with hyperglycemia, without long-term current use of insulin (HCC) Recommend diabetic diet and monitor blood glucose.  higher glucose level due to steroid use. Recommend to decrease the Dexamethasone  dosage to 4mg  daily for 2 days after chemotherapy.  Follow up with PCP for management. On Metformin  and Farxiga     No orders of the defined types were placed in this encounter.  Follow up 2 weeks  All questions were answered. The patient knows to call the clinic with any problems, questions or concerns. We spent sufficient time to discuss many aspect of care, questions were answered to patient's satisfaction.   Timmy Forbes, MD, PhD [facility] Hematology Oncology 04/28/2024    HISTORY OF PRESENTING ILLNESS:  Zoe [name] 58 y.o. female presents to establish care for left breast cancer I have reviewed her chart and materials related to her  cancer extensively and collaborated history with the patient. Summary of oncologic history is as follows: Oncology History  Invasive ductal carcinoma of breast, female, left (HCC)  02/17/2024 Mammogram   Bilateral screening mammogram showed  FINDINGS: In the left breast, a possible mass warrants further evaluation. In the right breast, no findings suspicious for malignancy.   IMPRESSION: Further evaluation is suggested for a possible mass in the left breast.    02/24/2024 Mammogram   Unilateral left breast diagnostic mammogram  1. There is an 8 mm mass at the site of screening mammographic concern which is highly suspicious for malignancy. Recommend ultrasound-guided biopsy for definitive characterization. 2. No suspicious LEFT axillary adenopathy.   03/08/2024 Initial Diagnosis   Invasive ductal carcinoma of breast, female, left   Patient underwent left breast mass biopsy Pathology showed  1. Breast, left, needle core biopsy, 8 o'clock, 8cmfn :       - INVASIVE DUCTAL CARCINOMA, GRADE 2, EXTENDING TO 0.6 CM IN MAXIMUM EXTENT, AND       INVOLVING TWO OF TWO BIOPSY FRAGMENTS.  SEE COMMENT.       - TUBULE FORMATION: SCORE 3       - NUCLEAR PLEOMORPHISM: SCORE 2       - MITOTIC COUNT: SCORE 1       - TOTAL SCORE: 6       - OVERALL GRADE:  2       - LYMPHOVASCULAR INVASION: NOT IDENTIFIED       - CANCER LENGTH: 6 MM       - CALCIFICATIONS: NOT IDENTIFIED       -  LYMPHOVASCULAR INVASION: NOT IDENTIFIED.   ER + 99% PR + 30%, Her2  negative (1+)    Menarche at age of 61 First live birth at age of 33 OCP use: >5 years, then switched to IUD History of hysterectomy: no Menopausal status: unsure History of HRT use: No History of chest radiation: no  Number of previous breast biopsies:  no  Denies family history of cancer, except that her father's cousin has breast cancer.    03/08/2024 Cancer Staging   Staging form: Breast, AJCC 8th Edition - Clinical stage from 03/08/2024: Stage  IA (cT1b, cN0, cM0, G2, ER+, PR+, HER2-) - Signed by Timmy Forbes, MD on 03/08/2024 Stage prefix: Initial diagnosis Histologic grading system: 3 grade system   03/17/2024 Surgery   Patient underwent left breast lumpectomy and SLNB  1. Breast, lumpectomy, left breast mass :       - INVASIVE MAMMARY CARCINOMA OF NO SPECIAL TYPE (DUCTAL).       - DUCTAL CARCINOMA IN SITU (DCIS).       - SEE CANCER SUMMARY BELOW.       - PRIOR BIOPSY SITE CHANGE WITH CLIP.       - SCOUT TAG IS PRESENT.        2. Lymph node, sentinel, biopsy, left axillary sentinel lymph node #1 :       - ONE LYMPH NODE NEGATIVE FOR MALIGNANCY (0/1).        3. Lymph node, sentinel, biopsy, left axillary sentinel lymph node #2 :       - ONE LYMPH NODE NEGATIVE FOR MALIGNANCY (0/1).    TUMOR Histologic Type: Invasive carcinoma of no special type (ductal) Histologic Grade (Nottingham Histologic Score) Glandular (Acinar)/Tubular Differentiation: 3 Nuclear Pleomorphism: 2 Mitotic Rate: 3 Overall Grade: 3 Tumor Size: Greatest dimension of largest invasive focus: 12 mm Ductal Carcinoma In Situ (DCIS): Present, intermediate to high-grade Tumor Extent: Not applicable Lymphatic and/or Vascular Invasion: Not identified Treatment Effect in the Breast: No known presurgical therapy  MARGINS Margin Status for Invasive Carcinoma: All margins negative for invasive carcinoma Distance from closest margin: 10 mm Specify closest margin: Anterior Margin Status for DCIS: All margins negative for DCIS Distance from DCIS to closest margin: 0.1 mm Specify closest margin: Anterior                      REGIONAL LYMPH NODES Regional Lymph Node Status: All regional lymph nodes negative for tumor Total Number of Lymph Nodes Examined (sentinel and non-sentinel): 2 Number of Sentinel Nodes Examined: 2  PATHOLOGIC STAGE CLASSIFICATION (pTNM, AJCC 8th Edition):  Modified Classification: Not applicable  pT Category: pT1c  T Suffix: Not applicable   pN Category: pN0  N Suffix: (sn)  pM Category: Not applicable     04/07/2024 Cancer Staging   Staging form: Breast, AJCC 8th Edition - Pathologic stage from 04/07/2024: Stage IA (pT1c, pN0, cM0, G3, ER+, PR+, HER2-, Oncotype DX score: 28) - Signed by Timmy Forbes, MD on 04/07/2024 Stage prefix: Initial diagnosis Multigene prognostic tests performed: Oncotype DX Recurrence score range: Greater than or equal to 11 Histologic grading system: 3 grade system   04/21/2024 -  Chemotherapy   Patient is on Treatment Plan : BREAST TC q21d      She tolerated 1st cycle of TC with GCSF support.  No nausea vomiting diarrhea. Feels tired today No other new complaints.    MEDICAL HISTORY:  Past Medical History:  Diagnosis Date   Allergic rhinitis  Asthma    Complication of anesthesia    a.) delayed/prolonged emergence   Coronary artery disease    Dysplastic nevi 08/30/2020   Right lateral distal deltoid, left lower back post waistline. 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   T2DM (type 2 diabetes mellitus) (HCC)    Unstable angina (HCC)     SURGICAL HISTORY: Past Surgical History:  Procedure Laterality Date   AXILLARY SENTINEL NODE BIOPSY Left 03/17/2024   Procedure: BIOPSY, LYMPH NODE, SENTINEL, AXILLARY;  Surgeon: Eldred Grego, MD;  Location: [facility];  Service: General;  Laterality: Left;   BREAST BIOPSY Left  03/03/2024   US  LT BREAST Bx venus marker,   8:00 8 cmfn : - INVASIVE DUCTAL CARCINOMA   BREAST BIOPSY Left 03/11/2024   US  LT RADIO FREQUENCY TAG LOC US  GUIDE 03/11/2024 [facility]   BREAST LUMPECTOMY Left 03/17/2024   Procedure: BREAST LUMPECTOMY;  Surgeon: Eldred Grego, MD;  Location: [facility];  Service: General;  Laterality: Left;  w/ RF tag   CARDIAC CATHETERIZATION     COLONOSCOPY  2001   COLONOSCOPY WITH PROPOFOL  N/A 11/05/2019   Procedure: COLONOSCOPY WITH PROPOFOL ;  Surgeon: Irby Mannan, MD;  Location: [facility];  Service: Endoscopy;  Laterality: N/A;   CORONARY ANGIOPLASTY     DILATATION & CURETTAGE/HYSTEROSCOPY WITH MYOSURE N/A 02/29/2016   Procedure: DILATATION & CURETTAGE/HYSTEROSCOPY WITH MYOSURE;  Surgeon: Margarie Shay Ward, MD;  Location: [facility];  Service: Gynecology;  Laterality: N/A;   DILATION AND CURETTAGE OF UTERUS     EAR CYST EXCISION Left 2015   IN OFFICE   ESOPHAGOGASTRODUODENOSCOPY     INTRAUTERINE DEVICE (IUD) INSERTION N/A 02/29/2016   Procedure: INTRAUTERINE DEVICE (IUD) INSERTION;  Surgeon: Margarie Shay Ward, MD;  Location: [facility];  Service: Gynecology;  Laterality: N/A;   LEFT HEART CATH AND CORONARY ANGIOGRAPHY Left 03/16/2019   Procedure: LEFT HEART CATH AND CORONARY ANGIOGRAPHY;  Surgeon: Cherrie Cornwall, MD;  Location: [facility];  Service: Cardiovascular;  Laterality: Left;    SOCIAL HISTORY: Social History   Socioeconomic History   Marital status: Married    Spouse name: Riano,Bobby T (Spouse)   Number of children: 2   Years of education: 18   Highest education level: Not on file  Occupational History   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  Other Topics Concern   Not on file  Social  History Narrative   Not on file   Social Drivers of Health   Financial Resource Strain: Low Risk  (03/09/2024)   Received from [facility]   Overall Financial Resource Strain (CARDIA)    Difficulty of Paying Living Expenses: Not hard at all  Food Insecurity: No Food Insecurity (03/09/2024)   Received from [facility]   Hunger Vital Sign    Worried About Running Out of Food in the Last Year: Never true    Ran Out of Food in the Last Year: Never true  Transportation Needs: No Transportation Needs (03/09/2024)   Received from [facility] - Transportation    In the past 12 months, has lack of transportation kept you from medical appointments or from getting medications?: No    Lack of Transportation (Non-Medical): No  Physical Activity: Not on file  Stress: Not on file  Social Connections: Not on file  Intimate Partner Violence: Not At Risk (03/08/2024)   Humiliation, Afraid, Rape, and Kick questionnaire    Fear of Current or Ex-Partner: No    Emotionally Abused: No    Physically Abused: No    Sexually Abused: No    FAMILY HISTORY: Family History  Problem Relation Age of Onset   Hyperlipidemia Mother    Hypertension Mother    Heart disease Father        died from either MI or stroke   Diabetes Brother        TYPE 2   Heart disease Paternal Grandfather    Diabetes Other        TYPE 2   Cancer Neg Hx    Breast cancer Neg Hx     ALLERGIES:  is allergic to ace inhibitors and neosporin wound cleanser [benzalkonium chloride].  MEDICATIONS:  Current Outpatient Medications  Medication Sig Dispense Refill   aspirin  81 MG tablet Take 81 mg by mouth daily.     Cholecalciferol (VITAMIN D-3 PO) Take 2,000 Units by mouth daily.     clopidogrel  (PLAVIX ) 75 MG  tablet TAKE 1 TABLET BY MOUTH EVERY DAY 90 tablet 1   Coenzyme Q10 (CO Q 10 PO) Take 1 capsule by mouth daily.     dexamethasone  (DECADRON ) 4 MG tablet Take 2 tabs by mouth 2 times daily starting day before chemo. Then take 2 tabs daily for 2 days starting day after chemo. Take with food. 30 tablet 1   FARXIGA 10 MG TABS tablet TAKE 1 TABLET BY MOUTH EVERY DAY IN THE MORNING 30 tablet 11   ibuprofen  (ADVIL ,MOTRIN ) 600 MG tablet Take 1 tablet (600 mg total) by mouth every 6 (six) hours as needed. 30 tablet 0   isosorbide  mononitrate (IMDUR ) 30 MG 24 hr tablet TAKE 1 TABLET BY MOUTH EVERY DAY 90 tablet 2   losartan-hydrochlorothiazide  (HYZAAR) 50-12.5 MG tablet TAKE 1 TABLET BY MOUTH EVERY DAY 90 tablet 0   metFORMIN  (GLUCOPHAGE ) 500 MG tablet TAKE 1 TABLET BY MOUTH 2 TIMES DAILY WITH A MEAL. 180 tablet 1   metoprolol  succinate (TOPROL -XL) 50 MG 24 hr tablet TAKE 1 TABLET BY MOUTH EVERY DAY 90 tablet 2   Omega-3 Fatty Acids (FISH OIL) 1200 MG CAPS Take 1,200 mg by mouth daily.     ondansetron  (ZOFRAN ) 8 MG tablet Take 1 tablet (8 mg total) by mouth every 8 (eight) hours as needed for nausea or vomiting. Start on the third day after chemotherapy. 30 tablet 3   pantoprazole  (PROTONIX ) 40  MG tablet TAKE 1 TABLET BY MOUTH EVERY DAY 90 tablet 1   prochlorperazine  (COMPAZINE ) 10 MG tablet Take 1 tablet (10 mg total) by mouth every 6 (six) hours as needed for nausea or vomiting. 30 tablet 3   rosuvastatin  (CRESTOR ) 40 MG tablet TAKE 1 TABLET BY MOUTH EVERY DAY 90 tablet 1   No current facility-administered medications for this visit.    Review of Systems  Constitutional:  Positive for fatigue. Negative for appetite change, chills and fever.  HENT:   Negative for hearing loss and voice change.   Eyes:  Negative for eye problems.  Respiratory:  Negative for chest tightness and cough.   Cardiovascular:  Negative for chest pain.  Gastrointestinal:  Negative for abdominal distention, abdominal pain and  blood in stool.  Endocrine: Negative for hot flashes.  Genitourinary:  Negative for difficulty urinating and frequency.   Musculoskeletal:  Negative for arthralgias.  Skin:  Negative for itching and rash.  Neurological:  Negative for extremity weakness.  Hematological:  Negative for adenopathy.  Psychiatric/Behavioral:  Negative for confusion.      PHYSICAL EXAMINATION: ECOG PERFORMANCE STATUS: 0 - Asymptomatic  Vitals:   04/28/24 0843  BP: 114/73  Pulse: (!) 101  Temp: 97.6 F (36.4 C)  SpO2: 97%   Filed Weights   04/28/24 0843  Weight: 152 lb 9.6 oz (69.2 kg)    Physical Exam Constitutional:      General: She is not in acute distress.    Appearance: She is not diaphoretic.  HENT:     Head: Normocephalic and atraumatic.     Nose: Nose normal.     Mouth/Throat:     Pharynx: No oropharyngeal exudate.  Eyes:     General: No scleral icterus.    Pupils: Pupils are equal, round, and reactive to light.  Cardiovascular:     Rate and Rhythm: Normal rate and regular rhythm.     Heart sounds: No murmur heard. Pulmonary:     Effort: Pulmonary effort is normal. No respiratory distress.     Breath sounds: Normal breath sounds. No wheezing.  Abdominal:     General: There is no distension.     Palpations: Abdomen is soft.     Tenderness: There is no abdominal tenderness.  Musculoskeletal:        General: Normal range of motion.     Cervical back: Normal range of motion and neck supple.  Skin:    General: Skin is warm and dry.     Findings: No erythema.  Neurological:     Mental Status: She is alert and oriented to person, place, and time. Mental status is at baseline.     Motor: No abnormal muscle tone.  Psychiatric:        Mood and Affect: Mood and affect normal.     LABORATORY DATA:  I have reviewed the data as listed    Latest Ref Rng & Units 04/28/2024    8:32 AM 04/21/2024    8:24 AM 03/08/2024    4:16 PM  CBC  WBC 4.0 - 10.5 K/uL 20.4  10.2  5.6   Hemoglobin  12.0 - 15.0 g/dL 16.1  09.6  04.5   Hematocrit 36.0 - 46.0 % 44.4  40.4  39.9   Platelets 150 - 400 K/uL 169  218  217       Latest Ref Rng & Units 04/28/2024    8:32 AM 04/21/2024    8:24 AM 03/08/2024    4:16  PM  CMP  Glucose 70 - 99 mg/dL 914  782  956   BUN 6 - 20 mg/dL 20  17  13    Creatinine 0.44 - 1.00 mg/dL 2.13  0.86  5.78   Sodium 135 - 145 mmol/L 135  139  137   Potassium 3.5 - 5.1 mmol/L 3.5  3.8  3.5   Chloride 98 - 111 mmol/L 100  106  101   CO2 22 - 32 mmol/L 21  19  24    Calcium  8.9 - 10.3 mg/dL 9.4  9.7  9.5   Total Protein 6.5 - 8.1 g/dL 7.2  7.3  7.3   Total Bilirubin 0.0 - 1.2 mg/dL 1.1  0.9  0.6   Alkaline Phos 38 - 126 U/L 92  60  53   AST 15 - 41 U/L 26  19  27    ALT 0 - 44 U/L 18  27  40      RADIOGRAPHIC STUDIES: I have personally reviewed the radiological images as listed and agreed with the findings in the report. No results found.

## 2024-04-28 NOTE — Progress Notes (Signed)
 NO IVF today

## 2024-04-28 NOTE — Progress Notes (Signed)
 REFERRING PROVIDER: Borders, Carlene Che, NP [address] [place],  [place] [address]  PRIMARY PROVIDER:  Alica Antu, NP  PRIMARY REASON FOR VISIT:  1. Invasive ductal carcinoma of breast, female, left (HCC)      HISTORY OF PRESENT ILLNESS:   Zoe [name], a 59 y.o. female, was seen for a [facility] cancer genetics consultation due to a personal history of breast cancer.  Zoe [name] presents to clinic today to discuss the possibility of a hereditary predisposition to cancer, genetic testing, and to further clarify her future cancer risks, as well as potential cancer risks for family members.    CANCER HISTORY:  Oncology History  Invasive ductal carcinoma of breast, female, left (HCC)  02/17/2024 Mammogram   Bilateral screening mammogram showed  FINDINGS: In the left breast, a possible mass warrants further evaluation. In the right breast, no findings suspicious for malignancy.   IMPRESSION: Further evaluation is suggested for a possible mass in the left breast.    02/24/2024 Mammogram   Unilateral left breast diagnostic mammogram  1. There is an 8 mm mass at the site of screening mammographic concern which is highly suspicious for malignancy. Recommend ultrasound-guided biopsy for definitive characterization. 2. No suspicious LEFT axillary adenopathy.   03/08/2024 Initial Diagnosis   Invasive ductal carcinoma of breast, female, left   Patient underwent left breast mass biopsy Pathology showed  1. Breast, left, needle core biopsy, 8 o'clock, 8cmfn :       - INVASIVE DUCTAL CARCINOMA, GRADE 2, EXTENDING TO 0.6 CM IN MAXIMUM EXTENT, AND       INVOLVING TWO OF TWO BIOPSY FRAGMENTS.  SEE COMMENT.       - TUBULE FORMATION: SCORE 3       - NUCLEAR PLEOMORPHISM: SCORE 2       - MITOTIC COUNT: SCORE 1       - TOTAL SCORE: 6       - OVERALL GRADE:  2       - LYMPHOVASCULAR INVASION: NOT IDENTIFIED       - CANCER LENGTH: 6 MM       - CALCIFICATIONS: NOT IDENTIFIED        - LYMPHOVASCULAR INVASION: NOT IDENTIFIED.   ER + 99% PR + 30%, Her2  negative (1+)    Menarche at age of 7 First live birth at age of 89 OCP use: >5 years, then switched to IUD History of hysterectomy: no Menopausal status: unsure History of HRT use: No History of chest radiation: no  Number of previous breast biopsies:  no  Denies family history of cancer, except that her father's cousin has breast cancer.    03/08/2024 Cancer Staging   Staging form: Breast, AJCC 8th Edition - Clinical stage from 03/08/2024: Stage IA (cT1b, cN0, cM0, G2, ER+, PR+, HER2-) - Signed by Timmy Forbes, MD on 03/08/2024 Stage prefix: Initial diagnosis Histologic grading system: 3 grade system   03/17/2024 Surgery   Patient underwent left breast lumpectomy and SLNB  1. Breast, lumpectomy, left breast mass :       - INVASIVE MAMMARY CARCINOMA OF NO SPECIAL TYPE (DUCTAL).       - DUCTAL CARCINOMA IN SITU (DCIS).       - SEE CANCER SUMMARY BELOW.       - PRIOR BIOPSY SITE CHANGE WITH CLIP.       - SCOUT TAG IS PRESENT.        2. Lymph node, sentinel, biopsy, left axillary sentinel lymph  node #1 :       - ONE LYMPH NODE NEGATIVE FOR MALIGNANCY (0/1).        3. Lymph node, sentinel, biopsy, left axillary sentinel lymph node #2 :       - ONE LYMPH NODE NEGATIVE FOR MALIGNANCY (0/1).    TUMOR Histologic Type: Invasive carcinoma of no special type (ductal) Histologic Grade (Nottingham Histologic Score) Glandular (Acinar)/Tubular Differentiation: 3 Nuclear Pleomorphism: 2 Mitotic Rate: 3 Overall Grade: 3 Tumor Size: Greatest dimension of largest invasive focus: 12 mm Ductal Carcinoma In Situ (DCIS): Present, intermediate to high-grade Tumor Extent: Not applicable Lymphatic and/or Vascular Invasion: Not identified Treatment Effect in the Breast: No known presurgical therapy  MARGINS Margin Status for Invasive Carcinoma: All margins negative for invasive carcinoma Distance from closest margin: 10  mm Specify closest margin: Anterior Margin Status for DCIS: All margins negative for DCIS Distance from DCIS to closest margin: 0.1 mm Specify closest margin: Anterior                      REGIONAL LYMPH NODES Regional Lymph Node Status: All regional lymph nodes negative for tumor Total Number of Lymph Nodes Examined (sentinel and non-sentinel): 2 Number of Sentinel Nodes Examined: 2  PATHOLOGIC STAGE CLASSIFICATION (pTNM, AJCC 8th Edition):  Modified Classification: Not applicable  pT Category: pT1c  T Suffix: Not applicable  pN Category: pN0  N Suffix: (sn)  pM Category: Not applicable     04/07/2024 Cancer Staging   Staging form: Breast, AJCC 8th Edition - Pathologic stage from 04/07/2024: Stage IA (pT1c, pN0, cM0, G3, ER+, PR+, HER2-, Oncotype DX score: 28) - Signed by Timmy Forbes, MD on 04/07/2024 Stage prefix: Initial diagnosis Multigene prognostic tests performed: Oncotype DX Recurrence score range: Greater than or equal to 11 Histologic grading system: 3 grade system   04/21/2024 -  Chemotherapy   Patient is on Treatment Plan : BREAST TC q21d      In 2025, at the age of 48, Zoe [name] was diagnosed with IDC of the left breast, ER/PR+, HER2-. The treatment plan included lumpectomy completed in April and is now being treated with chemotherapy.   RISK FACTORS:  Menarche was at age 4.  First live birth at age 24.   Ovaries intact: yes.  Hysterectomy: no.  HRT use: 0 years. Colonoscopy: yes; normal.  Past Medical History:  Diagnosis Date   Allergic rhinitis    Asthma    Complication of anesthesia    a.) delayed/prolonged emergence   Coronary artery disease    Dysplastic nevi 08/30/2020   Right lateral distal deltoid, left lower back post waistline. 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   T2DM (type 2 diabetes mellitus) (HCC)    Unstable angina [facility])     Past Surgical History:  Procedure Laterality Date   AXILLARY SENTINEL NODE BIOPSY Left 03/17/2024  Procedure: BIOPSY, LYMPH NODE, SENTINEL, AXILLARY;  Surgeon: Eldred Grego, MD;  Location: [facility] ORS;  Service: General;  Laterality: Left;   BREAST BIOPSY Left 03/03/2024   US  LT BREAST Bx venus marker,   8:00 8 cmfn : - INVASIVE DUCTAL CARCINOMA   BREAST BIOPSY Left 03/11/2024   US  LT RADIO FREQUENCY TAG LOC US  GUIDE 03/11/2024 [facility]   BREAST LUMPECTOMY Left 03/17/2024   Procedure: BREAST LUMPECTOMY;  Surgeon: Eldred Grego, MD;  Location: [facility] ORS;  Service: General;  Laterality: Left;  w/ RF tag   CARDIAC CATHETERIZATION     COLONOSCOPY  2001   COLONOSCOPY WITH PROPOFOL  N/A 11/05/2019   Procedure: COLONOSCOPY WITH PROPOFOL ;  Surgeon: Irby Mannan, MD;  Location: [facility];  Service: Endoscopy;  Laterality: N/A;   CORONARY ANGIOPLASTY     DILATATION & CURETTAGE/HYSTEROSCOPY WITH MYOSURE N/A 02/29/2016   Procedure: DILATATION & CURETTAGE/HYSTEROSCOPY WITH MYOSURE;  Surgeon: Margarie Shay Ward, MD;  Location: [facility];  Service: Gynecology;  Laterality: N/A;   DILATION AND CURETTAGE OF UTERUS     EAR CYST EXCISION Left 2015   IN OFFICE   ESOPHAGOGASTRODUODENOSCOPY     INTRAUTERINE DEVICE (IUD) INSERTION N/A 02/29/2016   Procedure: INTRAUTERINE DEVICE (IUD) INSERTION;  Surgeon: Margarie Shay Ward, MD;  Location: [facility];  Service: Gynecology;  Laterality: N/A;   LEFT HEART CATH AND CORONARY ANGIOGRAPHY  Left 03/16/2019   Procedure: LEFT HEART CATH AND CORONARY ANGIOGRAPHY;  Surgeon: Cherrie Cornwall, MD;  Location: [facility];  Service: Cardiovascular;  Laterality: Left;    FAMILY HISTORY:  We obtained a detailed, 4-generation family history.  Significant diagnoses are listed below: Family History  Problem Relation Age of Onset   Hyperlipidemia Mother    Hypertension Mother    Heart disease Father        died from either MI or stroke   Diabetes Brother        TYPE 2   Heart disease Paternal Grandfather    Diabetes Other        TYPE 2   Cancer Neg Hx    Breast cancer Neg Hx    Zoe [name] has 1 son, 51 and 1 daughter, 30. She has 2 brothers and 1 sister, no cancer history.  No known cancers on maternal side of the family, but she does report limited information on that side.  Two of father's first cousins had cancer: one had uterine in her 30s, and another had lung in her 22s. No other known cancers on this side of the family.  Zoe [name] is unaware of previous family history of genetic testing for hereditary cancer risks. There is no reported Ashkenazi Jewish ancestry. There is no known consanguinity.    GENETIC COUNSELING ASSESSMENT: Zoe [name] is a 58 y.o. female with a personal history of breast cancer which is somewhat suggestive of a hereditary cancer syndrome and predisposition to cancer. We, therefore, discussed and recommended the following at today's visit.   DISCUSSION: We discussed that approximately 10% of breast cancer is hereditary. Most cases of hereditary breast cancer are associated with BRCA1/2 genes, although there are other genes associated with hereditary cancer as well. Cancers and risks are gene specific. We discussed that testing is beneficial for several reasons including knowing about cancer risks, identifying potential screening and risk-reduction options that may be appropriate, and to understand if other family members could be at risk for  cancer and allow them to undergo genetic testing.   We reviewed  the characteristics, features and inheritance patterns of hereditary cancer syndromes. We also discussed genetic testing, including the appropriate family members to test, the process of testing, insurance coverage and turn-around-time for results. We discussed the implications of a negative, positive and/or variant of uncertain significant result. We recommended Zoe [name] pursue genetic testing for the [facility] CancerNext+RNA gene panel.   Based on Zoe [name]'s personal history of cancer, she meets medical criteria for genetic testing. Despite that she meets criteria, she may still have an out of pocket cost.   PLAN: After considering the risks, benefits, and limitations, Zoe [name] provided informed consent to pursue genetic testing and the blood sample was sent to [facility] for analysis of the CancerNext+RNA panel. Results should be available within approximately 2-3 weeks' time, at which point they will be disclosed by MyChart message to Zoe [name], as will any additional recommendations warranted by these results. Zoe [name] will receive a summary of her genetic counseling visit and a copy of her results once available. This information will also be available in Epic.   Zoe [name] questions were answered to her satisfaction today. Our contact information was provided should additional questions or concerns arise. Thank you for the referral and allowing us  to share in the care of your patient.   Valri Gee, MS, [facility] Genetic Counselor [email or web address] Phone: [phone]  50 minutes were spent on the date of the encounter in service to the patient including preparation, face-to-face consultation, documentation and care coordination. Dr. Nelson Bandy was available for discussion regarding this case.   _______________________________________________________________________ For Office Staff:   Number of people involved in session: 1 Was an Intern/ student involved with case: no

## 2024-04-29 ENCOUNTER — Encounter: Payer: Self-pay | Admitting: Cardiovascular Disease

## 2024-04-29 ENCOUNTER — Ambulatory Visit: Payer: Self-pay | Admitting: Cardiovascular Disease

## 2024-04-29 VITALS — BP 103/70 | HR 86 | Ht 64.0 in | Wt 150.6 lb

## 2024-04-29 DIAGNOSIS — I10 Essential (primary) hypertension: Secondary | ICD-10-CM

## 2024-04-29 DIAGNOSIS — E1165 Type 2 diabetes mellitus with hyperglycemia: Secondary | ICD-10-CM

## 2024-04-29 DIAGNOSIS — I2510 Atherosclerotic heart disease of native coronary artery without angina pectoris: Secondary | ICD-10-CM | POA: Diagnosis not present

## 2024-04-29 DIAGNOSIS — I214 Non-ST elevation (NSTEMI) myocardial infarction: Secondary | ICD-10-CM

## 2024-04-29 NOTE — Progress Notes (Signed)
 Cardiology Office Note   Date:  04/29/2024   ID:  Zoe [name], DOB 1966/02/17, MRN [id number]  PCP:  Alica Antu, NP  Cardiologist:  Debborah Fairly, MD      History of Present Illness: Zoe [name] is a 58 y.o. female who presents for  Chief Complaint  Patient presents with   Follow-up    6 month follow up    No chest pain and SOB, had diagnosis of breast cancer and getting chemo.      Past Medical History:  Diagnosis Date   Allergic rhinitis    Asthma    Complication of anesthesia    a.) delayed/prolonged emergence   Coronary artery disease    Dysplastic nevi 08/30/2020   Right lateral distal deltoid, left lower back post waistline. 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   T2DM (type 2 diabetes mellitus) (HCC)    Unstable angina (HCC)      Past Surgical History:  Procedure Laterality Date   AXILLARY SENTINEL NODE BIOPSY Left 03/17/2024   Procedure: BIOPSY, LYMPH NODE, SENTINEL, AXILLARY;  Surgeon: Eldred Grego, MD;  Location: [facility];  Service: General;  Laterality: Left;   BREAST BIOPSY Left 03/03/2024   US  LT BREAST Bx venus marker,   8:00 8 cmfn : - INVASIVE DUCTAL CARCINOMA   BREAST BIOPSY Left 03/11/2024   US  LT RADIO FREQUENCY TAG LOC US  GUIDE 03/11/2024 [facility]   BREAST LUMPECTOMY Left 03/17/2024   Procedure: BREAST LUMPECTOMY;  Surgeon: Eldred Grego, MD;  Location: [facility];  Service: General;  Laterality: Left;  w/ RF tag   CARDIAC CATHETERIZATION     COLONOSCOPY  2001   COLONOSCOPY WITH PROPOFOL  N/A 11/05/2019   Procedure: COLONOSCOPY WITH PROPOFOL ;  Surgeon: Irby Mannan, MD;  Location: [facility];  Service: Endoscopy;  Laterality: N/A;   CORONARY ANGIOPLASTY     DILATATION & CURETTAGE/HYSTEROSCOPY WITH MYOSURE N/A 02/29/2016   Procedure: DILATATION & CURETTAGE/HYSTEROSCOPY WITH MYOSURE;  Surgeon: Margarie Shay Ward, MD;  Location: [facility];  Service: Gynecology;  Laterality: N/A;   DILATION AND CURETTAGE OF UTERUS     EAR CYST EXCISION Left 2015   IN OFFICE   ESOPHAGOGASTRODUODENOSCOPY     INTRAUTERINE DEVICE (IUD) INSERTION N/A 02/29/2016   Procedure: INTRAUTERINE DEVICE (IUD) INSERTION;  Surgeon: Margarie Shay Ward, MD;  Location: [facility];  Service: Gynecology;  Laterality: N/A;   LEFT HEART CATH AND CORONARY ANGIOGRAPHY Left 03/16/2019   Procedure: LEFT HEART CATH AND CORONARY ANGIOGRAPHY;  Surgeon: Cherrie Cornwall, MD;  Location: [facility];  Service: Cardiovascular;  Laterality: Left;     Current Outpatient Medications  Medication Sig Dispense Refill   aspirin  81 MG tablet Take 81 mg by mouth daily.     Cholecalciferol (VITAMIN D-3 PO) Take 2,000 Units by mouth daily.     Coenzyme Q10 (CO Q 10 PO) Take 1 capsule  by mouth daily.     dexamethasone  (DECADRON ) 4 MG tablet Take 2 tabs by mouth 2 times daily starting day before chemo. Then take 2 tabs daily for 2 days starting day after chemo. Take with food. 30 tablet 1   FARXIGA 10 MG TABS tablet TAKE 1 TABLET BY MOUTH EVERY DAY IN THE MORNING 30 tablet 11   ibuprofen  (ADVIL ,MOTRIN ) 600 MG tablet Take 1 tablet (600 mg total) by mouth every 6 (six) hours as needed. 30 tablet 0   isosorbide  mononitrate (IMDUR ) 30 MG 24 hr tablet TAKE 1 TABLET BY MOUTH EVERY DAY 90 tablet 2   losartan-hydrochlorothiazide  (HYZAAR) 50-12.5 MG tablet TAKE 1 TABLET BY MOUTH EVERY  DAY 90 tablet 0   metFORMIN  (GLUCOPHAGE ) 500 MG tablet TAKE 1 TABLET BY MOUTH 2 TIMES DAILY WITH A MEAL. 180 tablet 1   metoprolol  succinate (TOPROL -XL) 50 MG 24 hr tablet TAKE 1 TABLET BY MOUTH EVERY DAY 90 tablet 2   Omega-3 Fatty Acids (FISH OIL) 1200 MG CAPS Take 1,200 mg by mouth daily.     ondansetron  (ZOFRAN ) 8 MG tablet Take 1 tablet (8 mg total) by mouth every 8 (eight) hours as needed for nausea or vomiting. Start on the third day after chemotherapy. 30 tablet 3   pantoprazole  (PROTONIX ) 40 MG tablet TAKE 1 TABLET BY MOUTH EVERY DAY 90 tablet 1   prochlorperazine  (COMPAZINE ) 10 MG tablet Take 1 tablet (10 mg total) by mouth every 6 (six) hours as needed for nausea or vomiting. 30 tablet 3   rosuvastatin  (CRESTOR ) 40 MG tablet TAKE 1 TABLET BY MOUTH EVERY DAY 90 tablet 1   No current facility-administered medications for this visit.    Allergies:   Ace inhibitors and Neosporin wound cleanser [benzalkonium chloride]    Social History:   reports that she has never smoked. She has never used smokeless tobacco. She reports current alcohol use of about 3.0 - 5.0 standard drinks of alcohol per week. She reports that she does not use drugs.   Family History:  family history includes Diabetes in her brother and another family member; Heart disease in her father and paternal grandfather; Hyperlipidemia in her mother; Hypertension in her mother.    ROS:     Review of Systems  Constitutional: Negative.   HENT: Negative.    Eyes: Negative.   Respiratory: Negative.    Gastrointestinal: Negative.   Genitourinary: Negative.   Musculoskeletal: Negative.   Skin: Negative.   Neurological: Negative.   Endo/Heme/Allergies: Negative.   Psychiatric/Behavioral: Negative.    All other systems reviewed and are negative.     All other systems are reviewed and negative.    PHYSICAL EXAM: VS:  BP 103/70   Pulse 86   Ht 5\' 4"  (1.626 m)   Wt 150 lb 9.6 oz (68.3 kg)   SpO2 96%   BMI 25.85  kg/m  , BMI Body mass index is 25.85 kg/m. Last weight:  Wt Readings from Last 3 Encounters:  04/29/24 150 lb 9.6 oz (68.3 kg)  04/28/24 152 lb 9.6 oz (69.2 kg)  04/21/24 159 lb (72.1 kg)     Physical Exam Constitutional:      Appearance: Normal appearance.  Cardiovascular:     Rate and Rhythm: Normal rate and regular rhythm.     Heart sounds: Normal heart sounds.  Pulmonary:     Effort: Pulmonary effort is normal.     Breath sounds: Normal breath sounds.  Musculoskeletal:     Right lower  leg: No edema.     Left lower leg: No edema.  Neurological:     Mental Status: She is alert.       EKG:   Recent Labs: 02/13/2024: TSH 1.490 04/28/2024: ALT 18; BUN 20; Creatinine 0.70; Hemoglobin 14.8; Platelet Count 169; Potassium 3.5; Sodium 135    Lipid Panel    Component Value Date/Time   CHOL 122 02/13/2024 0831   TRIG 128 02/13/2024 0831   HDL 37 (L) 02/13/2024 0831   CHOLHDL 3.3 02/13/2024 0831   LDLCALC 62 02/13/2024 0831      Other studies Reviewed: Additional studies/ records that were reviewed today include:  Review of the above records demonstrates:       No data to display            ASSESSMENT AND PLAN:    ICD-10-CM   1. NSTEMI (non-ST elevated myocardial infarction) (HCC)  I21.4    wants to stop plavix  and continue asp, been on it for years    2. Coronary artery disease involving native coronary artery of native heart without angina pectoris  I25.10    no chest pain or SOB.    3. Primary hypertension  I10     4. Type 2 diabetes mellitus with hyperglycemia, without long-term current use of insulin (HCC)  E11.65        Problem List Items Addressed This Visit       Cardiovascular and Mediastinum   Coronary artery disease involving native coronary artery of native heart without angina pectoris (Chronic)   Hypertension   NSTEMI (non-ST elevated myocardial infarction) (HCC) - Primary     Endocrine   Type 2 diabetes mellitus with  hyperglycemia, without long-term current use of insulin (HCC) (Chronic)       Disposition:   Return in about 3 months (around 07/30/2024).    Total time spent: 30 minutes  Signed,  Debborah Fairly, MD  04/29/2024 3:01 PM    [facility]

## 2024-04-30 ENCOUNTER — Other Ambulatory Visit: Payer: Self-pay

## 2024-05-12 ENCOUNTER — Encounter: Payer: Self-pay | Admitting: Oncology

## 2024-05-12 ENCOUNTER — Other Ambulatory Visit: Payer: Self-pay | Admitting: Cardiovascular Disease

## 2024-05-12 ENCOUNTER — Inpatient Hospital Stay

## 2024-05-12 ENCOUNTER — Inpatient Hospital Stay: Admitting: Oncology

## 2024-05-12 VITALS — BP 129/84 | HR 72 | Temp 97.6°F | Resp 16 | Wt 155.0 lb

## 2024-05-12 DIAGNOSIS — C50912 Malignant neoplasm of unspecified site of left female breast: Secondary | ICD-10-CM

## 2024-05-12 DIAGNOSIS — E1165 Type 2 diabetes mellitus with hyperglycemia: Secondary | ICD-10-CM

## 2024-05-12 DIAGNOSIS — E7800 Pure hypercholesterolemia, unspecified: Secondary | ICD-10-CM

## 2024-05-12 DIAGNOSIS — Z5111 Encounter for antineoplastic chemotherapy: Secondary | ICD-10-CM | POA: Diagnosis not present

## 2024-05-12 DIAGNOSIS — I10 Essential (primary) hypertension: Secondary | ICD-10-CM

## 2024-05-12 DIAGNOSIS — I2510 Atherosclerotic heart disease of native coronary artery without angina pectoris: Secondary | ICD-10-CM

## 2024-05-12 DIAGNOSIS — I2089 Other forms of angina pectoris: Secondary | ICD-10-CM

## 2024-05-12 LAB — CMP (CANCER CENTER ONLY)
ALT: 30 U/L (ref 0–44)
AST: 15 U/L (ref 15–41)
Albumin: 4.3 g/dL (ref 3.5–5.0)
Alkaline Phosphatase: 63 U/L (ref 38–126)
Anion gap: 10 (ref 5–15)
BUN: 19 mg/dL (ref 6–20)
CO2: 19 mmol/L — ABNORMAL LOW (ref 22–32)
Calcium: 8.9 mg/dL (ref 8.9–10.3)
Chloride: 105 mmol/L (ref 98–111)
Creatinine: 0.46 mg/dL (ref 0.44–1.00)
GFR, Estimated: >60 mL/min (ref >60)
Glucose, Bld: 214 mg/dL — ABNORMAL HIGH (ref 70–99)
Potassium: 3.5 mmol/L (ref 3.5–5.1)
Sodium: 134 mmol/L — ABNORMAL LOW (ref 135–145)
Total Bilirubin: 1 mg/dL (ref 0.0–1.2)
Total Protein: 6.8 g/dL (ref 6.5–8.1)

## 2024-05-12 LAB — CBC WITH DIFFERENTIAL (CANCER CENTER ONLY)
Abs Immature Granulocytes: 0.64 10*3/uL — ABNORMAL HIGH (ref 0.00–0.07)
Basophils Absolute: 0.1 10*3/uL (ref 0.0–0.1)
Basophils Relative: 0 %
Eosinophils Absolute: 0 10*3/uL (ref 0.0–0.5)
Eosinophils Relative: 0 %
HCT: 34.7 % — ABNORMAL LOW (ref 36.0–46.0)
Hemoglobin: 11.8 g/dL — ABNORMAL LOW (ref 12.0–15.0)
Immature Granulocytes: 4 %
Lymphocytes Relative: 4 %
Lymphs Abs: 0.8 10*3/uL (ref 0.7–4.0)
MCH: 27.9 pg (ref 26.0–34.0)
MCHC: 34 g/dL (ref 30.0–36.0)
MCV: 82 fL (ref 80.0–100.0)
Monocytes Absolute: 0.5 10*3/uL (ref 0.1–1.0)
Monocytes Relative: 3 %
Neutro Abs: 16.1 10*3/uL — ABNORMAL HIGH (ref 1.7–7.7)
Neutrophils Relative %: 89 %
Platelet Count: 248 10*3/uL (ref 150–400)
RBC: 4.23 MIL/uL (ref 3.87–5.11)
RDW: 15 % (ref 11.5–15.5)
WBC Count: 18.1 10*3/uL — ABNORMAL HIGH (ref 4.0–10.5)
nRBC: 0 % (ref 0.0–0.2)

## 2024-05-12 LAB — GENETIC SCREENING ORDER

## 2024-05-12 MED ORDER — PALONOSETRON HCL INJECTION 0.25 MG/5ML
0.2500 mg | Freq: Once | INTRAVENOUS | Status: AC
  Administered 2024-05-12: 0.25 mg via INTRAVENOUS
  Filled 2024-05-12: qty 5

## 2024-05-12 MED ORDER — SODIUM CHLORIDE 0.9 % IV SOLN
600.0000 mg/m2 | Freq: Once | INTRAVENOUS | Status: AC
  Administered 2024-05-12: 1000 mg via INTRAVENOUS
  Filled 2024-05-12: qty 50

## 2024-05-12 MED ORDER — DEXAMETHASONE SODIUM PHOSPHATE 10 MG/ML IJ SOLN
10.0000 mg | Freq: Once | INTRAMUSCULAR | Status: AC
  Administered 2024-05-12: 10 mg via INTRAVENOUS
  Filled 2024-05-12: qty 1

## 2024-05-12 MED ORDER — SODIUM CHLORIDE 0.9 % IV SOLN
INTRAVENOUS | Status: DC
  Filled 2024-05-12: qty 250

## 2024-05-12 MED ORDER — SODIUM CHLORIDE 0.9 % IV SOLN
75.0000 mg/m2 | Freq: Once | INTRAVENOUS | Status: AC
  Administered 2024-05-12: 137 mg via INTRAVENOUS
  Filled 2024-05-12: qty 13.7

## 2024-05-12 NOTE — Assessment & Plan Note (Signed)
 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  Recommend adjuvant chemotherapy.  Given her history of cardiovascular disease including CAD, NSTEMI, I recommend non anthracycline regimen with TC Q3 weeks x 4 cycles.  Labs are reviewed and discussed with patient. Proceed with  cycle 2 TC with D3 GCSF.  She tolerates well.

## 2024-05-12 NOTE — Progress Notes (Signed)
 Hematology/Oncology Progress note [phone] [phone]      CHIEF COMPLAINTS/PURPOSE OF CONSULTATION:  Left breast cancer  ASSESSMENT & PLAN:   Cancer Staging  Invasive ductal carcinoma of breast, female, left (HCC) Staging form: Breast, AJCC 8th Edition - Clinical stage from 03/08/2024: Stage IA (cT1b, cN0, cM0, G2, ER+, PR+, HER2-) - Signed by Timmy Forbes, MD on 03/08/2024 - Pathologic stage from 04/07/2024: Stage IA (pT1c, pN0, cM0, G3, ER+, PR+, HER2-, Oncotype DX score: 28) - Signed by Timmy Forbes, MD on 04/07/2024   Invasive ductal carcinoma of breast, female, left (HCC) Left breast invasive ductal carcinoma, pT1b cN0 ER 99%+/PR 30%+, HER2 low (1+) Pathology and radiology counseling: Oncotype DX RS 28, >15% chemotherapy benefit.  Recommend adjuvant chemotherapy.  Given her history of cardiovascular disease including CAD, NSTEMI, I recommend non anthracycline regimen with TC Q3 weeks x 4 cycles.  Labs are reviewed and discussed with patient. Proceed with  cycle 2 TC with D3 GCSF.  She tolerates well.    Encounter for antineoplastic chemotherapy Treatment plan as listed above.   Type 2 diabetes mellitus with hyperglycemia, without long-term current use of insulin (HCC) Recommend diabetic diet and monitor blood glucose.  higher glucose level due to steroid use. Recommend to decrease the Dexamethasone  dosage to 4mg  daily for 2 days after chemotherapy.  Follow up with PCP for management. On Metformin  and Farxiga     No orders of the defined types were placed in this encounter.  Follow up 3 weeks  All questions were answered. The patient knows to call the clinic with any problems, questions or concerns. We spent sufficient time to discuss many aspect of care, questions were answered to patient's satisfaction.   Timmy Forbes, MD, PhD [facility] Hematology Oncology 05/12/2024    HISTORY OF PRESENTING ILLNESS:  Zoe [name] 58 y.o. female presents to  establish care for left breast cancer I have reviewed her chart and materials related to her cancer extensively and collaborated history with the patient. Summary of oncologic history is as follows: Oncology History  Invasive ductal carcinoma of breast, female, left (HCC)  02/17/2024 Mammogram   Bilateral screening mammogram showed  FINDINGS: In the left breast, a possible mass warrants further evaluation. In the right breast, no findings suspicious for malignancy.   IMPRESSION: Further evaluation is suggested for a possible mass in the left breast.    02/24/2024 Mammogram   Unilateral left breast diagnostic mammogram  1. There is an 8 mm mass at the site of screening mammographic concern which is highly suspicious for malignancy. Recommend ultrasound-guided biopsy for definitive characterization. 2. No suspicious LEFT axillary adenopathy.   03/08/2024 Initial Diagnosis   Invasive ductal carcinoma of breast, female, left   Patient underwent left breast mass biopsy Pathology showed  1. Breast, left, needle core biopsy, 8 o'clock, 8cmfn :       - INVASIVE DUCTAL CARCINOMA, GRADE 2, EXTENDING TO 0.6 CM IN MAXIMUM EXTENT, AND       INVOLVING TWO OF TWO BIOPSY FRAGMENTS.  SEE COMMENT.       - TUBULE FORMATION: SCORE 3       - NUCLEAR PLEOMORPHISM: SCORE 2       - MITOTIC COUNT: SCORE 1       - TOTAL SCORE: 6       - OVERALL GRADE:  2       - LYMPHOVASCULAR INVASION: NOT IDENTIFIED       - CANCER LENGTH: 6 MM       -  CALCIFICATIONS: NOT IDENTIFIED       - LYMPHOVASCULAR INVASION: NOT IDENTIFIED.   ER + 99% PR + 30%, Her2  negative (1+)    Menarche at age of 37 First live birth at age of 22 OCP use: >5 years, then switched to IUD History of hysterectomy: no Menopausal status: unsure History of HRT use: No History of chest radiation: no  Number of previous breast biopsies:  no  Denies family history of cancer, except that her father's cousin has breast cancer.    03/08/2024  Cancer Staging   Staging form: Breast, AJCC 8th Edition - Clinical stage from 03/08/2024: Stage IA (cT1b, cN0, cM0, G2, ER+, PR+, HER2-) - Signed by Timmy Forbes, MD on 03/08/2024 Stage prefix: Initial diagnosis Histologic grading system: 3 grade system   03/17/2024 Surgery   Patient underwent left breast lumpectomy and SLNB  1. Breast, lumpectomy, left breast mass :       - INVASIVE MAMMARY CARCINOMA OF NO SPECIAL TYPE (DUCTAL).       - DUCTAL CARCINOMA IN SITU (DCIS).       - SEE CANCER SUMMARY BELOW.       - PRIOR BIOPSY SITE CHANGE WITH CLIP.       - SCOUT TAG IS PRESENT.        2. Lymph node, sentinel, biopsy, left axillary sentinel lymph node #1 :       - ONE LYMPH NODE NEGATIVE FOR MALIGNANCY (0/1).        3. Lymph node, sentinel, biopsy, left axillary sentinel lymph node #2 :       - ONE LYMPH NODE NEGATIVE FOR MALIGNANCY (0/1).    TUMOR Histologic Type: Invasive carcinoma of no special type (ductal) Histologic Grade (Nottingham Histologic Score) Glandular (Acinar)/Tubular Differentiation: 3 Nuclear Pleomorphism: 2 Mitotic Rate: 3 Overall Grade: 3 Tumor Size: Greatest dimension of largest invasive focus: 12 mm Ductal Carcinoma In Situ (DCIS): Present, intermediate to high-grade Tumor Extent: Not applicable Lymphatic and/or Vascular Invasion: Not identified Treatment Effect in the Breast: No known presurgical therapy  MARGINS Margin Status for Invasive Carcinoma: All margins negative for invasive carcinoma Distance from closest margin: 10 mm Specify closest margin: Anterior Margin Status for DCIS: All margins negative for DCIS Distance from DCIS to closest margin: 0.1 mm Specify closest margin: Anterior                      REGIONAL LYMPH NODES Regional Lymph Node Status: All regional lymph nodes negative for tumor Total Number of Lymph Nodes Examined (sentinel and non-sentinel): 2 Number of Sentinel Nodes Examined: 2  PATHOLOGIC STAGE CLASSIFICATION (pTNM, AJCC 8th  Edition):  Modified Classification: Not applicable  pT Category: pT1c  T Suffix: Not applicable  pN Category: pN0  N Suffix: (sn)  pM Category: Not applicable     04/07/2024 Cancer Staging   Staging form: Breast, AJCC 8th Edition - Pathologic stage from 04/07/2024: Stage IA (pT1c, pN0, cM0, G3, ER+, PR+, HER2-, Oncotype DX score: 28) - Signed by Timmy Forbes, MD on 04/07/2024 Stage prefix: Initial diagnosis Multigene prognostic tests performed: Oncotype DX Recurrence score range: Greater than or equal to 11 Histologic grading system: 3 grade system   04/21/2024 -  Chemotherapy   Patient is on Treatment Plan : BREAST TC q21d      She tolerated 1st cycle of TC with GCSF support.  No nausea vomiting diarrhea. No fever or chills.  No other new complaints.    MEDICAL HISTORY:  Past Medical History:  Diagnosis Date   Allergic rhinitis    Asthma    Complication of anesthesia    a.) delayed/prolonged emergence   Coronary artery disease    Dysplastic nevi 08/30/2020   Right lateral distal deltoid, left lower back post waistline. 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   T2DM (type 2 diabetes mellitus) (HCC)    Unstable angina (HCC)     SURGICAL HISTORY: Past Surgical History:  Procedure Laterality Date   AXILLARY SENTINEL NODE BIOPSY Left 03/17/2024   Procedure: BIOPSY, LYMPH NODE, SENTINEL, AXILLARY;  Surgeon:  Eldred Grego, MD;  Location: [facility];  Service: General;  Laterality: Left;   BREAST BIOPSY Left 03/03/2024   US  LT BREAST Bx venus marker,   8:00 8 cmfn : - INVASIVE DUCTAL CARCINOMA   BREAST BIOPSY Left 03/11/2024   US  LT RADIO FREQUENCY TAG LOC US  GUIDE 03/11/2024 [facility]   BREAST LUMPECTOMY Left 03/17/2024   Procedure: BREAST LUMPECTOMY;  Surgeon: Eldred Grego, MD;  Location: [facility];  Service: General;  Laterality: Left;  w/ RF tag   CARDIAC CATHETERIZATION     COLONOSCOPY  2001   COLONOSCOPY WITH PROPOFOL  N/A 11/05/2019   Procedure: COLONOSCOPY WITH PROPOFOL ;  Surgeon: Irby Mannan, MD;  Location: [facility];  Service: Endoscopy;  Laterality: N/A;   CORONARY ANGIOPLASTY     DILATATION & CURETTAGE/HYSTEROSCOPY WITH MYOSURE N/A 02/29/2016   Procedure: DILATATION & CURETTAGE/HYSTEROSCOPY WITH MYOSURE;  Surgeon: Margarie Shay Ward, MD;  Location: [facility];  Service: Gynecology;  Laterality: N/A;   DILATION AND CURETTAGE OF UTERUS     EAR CYST EXCISION Left 2015   IN OFFICE   ESOPHAGOGASTRODUODENOSCOPY     INTRAUTERINE DEVICE (IUD) INSERTION N/A 02/29/2016   Procedure: INTRAUTERINE DEVICE (IUD) INSERTION;  Surgeon: Margarie Shay Ward, MD;  Location: [facility];  Service: Gynecology;  Laterality: N/A;   LEFT HEART CATH AND CORONARY ANGIOGRAPHY Left 03/16/2019   Procedure: LEFT HEART CATH AND CORONARY ANGIOGRAPHY;  Surgeon: Cherrie Cornwall, MD;  Location: [facility];  Service: Cardiovascular;  Laterality: Left;    SOCIAL HISTORY: Social History   Socioeconomic History   Marital status: Married    Spouse name: Johannsen,Bobby T (Spouse)   Number of children: 2   Years of education: 18   Highest education level: Not on file  Occupational History   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  Other Topics Concern   Not on file  Social History Narrative   Not on file   Social Drivers of Health   Financial Resource Strain: Low Risk  (03/09/2024)   Received from [facility]   Overall Financial Resource Strain (CARDIA)    Difficulty of Paying Living Expenses: Not hard at all  Food Insecurity: No Food Insecurity (03/09/2024)   Received from [facility]   Hunger Vital Sign    Worried About Running Out of Food in the Last Year: Never true    Ran Out of Food in the Last Year: Never true  Transportation Needs: No Transportation Needs (03/09/2024)   Received from [facility] - Transportation    In the past 12 months, has lack of transportation kept you from medical appointments or from getting medications?: No    Lack of Transportation (Non-Medical): No  Physical Activity: Not on file  Stress: Not on file  Social Connections: Not on file  Intimate Partner Violence: Not At Risk (03/08/2024)   Humiliation, Afraid, Rape, and Kick questionnaire    Fear of Current or Ex-Partner: No    Emotionally Abused: No    Physically Abused: No    Sexually Abused: No    FAMILY HISTORY: Family History  Problem Relation Age of Onset   Hyperlipidemia Mother    Hypertension Mother    Heart disease Father        died from either MI or stroke   Diabetes Brother        TYPE 2   Heart disease Paternal Grandfather    Diabetes Other        TYPE 2   Cancer Neg Hx    Breast cancer Neg Hx     ALLERGIES:  is allergic to ace inhibitors and neosporin wound cleanser [benzalkonium chloride].  MEDICATIONS:  Current Outpatient Medications  Medication Sig Dispense Refill   aspirin  81 MG tablet Take 81 mg by mouth daily.      Cholecalciferol (VITAMIN D-3 PO) Take 2,000 Units by mouth daily.     Coenzyme Q10 (CO Q 10 PO) Take 1 capsule by mouth daily.     dexamethasone  (DECADRON ) 4 MG tablet Take 2 tabs by mouth 2 times daily starting day before chemo. Then take 2 tabs daily for 2 days starting day after chemo. Take with food. 30 tablet 1   FARXIGA 10 MG TABS tablet TAKE 1 TABLET BY MOUTH EVERY DAY IN THE MORNING 30 tablet 11   ibuprofen  (ADVIL ,MOTRIN ) 600 MG tablet Take 1 tablet (600 mg total) by mouth every 6 (six) hours as needed. 30 tablet 0   isosorbide  mononitrate (IMDUR ) 30 MG 24 hr tablet TAKE 1 TABLET BY MOUTH EVERY DAY 90 tablet 2   losartan-hydrochlorothiazide  (HYZAAR) 50-12.5 MG tablet TAKE 1 TABLET BY MOUTH EVERY DAY 90 tablet 0   metFORMIN  (GLUCOPHAGE ) 500 MG tablet TAKE 1 TABLET BY MOUTH 2 TIMES DAILY WITH A MEAL. 180 tablet 1   metoprolol  succinate (TOPROL -XL) 50 MG 24 hr tablet TAKE 1 TABLET BY MOUTH EVERY DAY 90 tablet 2   Omega-3 Fatty Acids (FISH OIL) 1200 MG CAPS Take 1,200 mg by mouth daily.     ondansetron  (ZOFRAN ) 8 MG tablet Take 1 tablet (8 mg total) by mouth every 8 (eight) hours as needed for nausea or vomiting. Start on the third day after chemotherapy. 30 tablet 3   pantoprazole  (PROTONIX ) 40 MG tablet TAKE 1  TABLET BY MOUTH EVERY DAY 90 tablet 1   prochlorperazine  (COMPAZINE ) 10 MG tablet Take 1 tablet (10 mg total) by mouth every 6 (six) hours as needed for nausea or vomiting. 30 tablet 3   rosuvastatin  (CRESTOR ) 40 MG tablet TAKE 1 TABLET BY MOUTH EVERY DAY 90 tablet 1   No current facility-administered medications for this visit.   Facility-Administered Medications Ordered in Other Visits  Medication Dose Route Frequency Provider Last Rate Last Admin   0.9 %  sodium chloride  infusion   Intravenous Continuous Timmy Forbes, MD 10 mL/hr at 05/12/24 0919 New Bag at 05/12/24 0919   cyclophosphamide  (CYTOXAN ) 1,000 mg in sodium chloride  0.9 % 250 mL chemo infusion  600 mg/m2 (Treatment Plan  Recorded) Intravenous Once Timmy Forbes, MD       DOCEtaxel  (TAXOTERE ) 137 mg in sodium chloride  0.9 % 250 mL chemo infusion  75 mg/m2 (Treatment Plan Recorded) Intravenous Once Timmy Forbes, MD 264 mL/hr at 05/12/24 1021 137 mg at 05/12/24 1021    Review of Systems  Constitutional:  Positive for fatigue. Negative for appetite change, chills and fever.  HENT:   Negative for hearing loss and voice change.   Eyes:  Negative for eye problems.  Respiratory:  Negative for chest tightness and cough.   Cardiovascular:  Negative for chest pain.  Gastrointestinal:  Negative for abdominal distention, abdominal pain and blood in stool.  Endocrine: Negative for hot flashes.  Genitourinary:  Negative for difficulty urinating and frequency.   Musculoskeletal:  Negative for arthralgias.  Skin:  Negative for itching and rash.  Neurological:  Negative for extremity weakness.  Hematological:  Negative for adenopathy.  Psychiatric/Behavioral:  Negative for confusion.      PHYSICAL EXAMINATION: ECOG PERFORMANCE STATUS: 0 - Asymptomatic  Vitals:   05/12/24 0846  BP: 129/84  Pulse: 72  Resp: 16  Temp: 97.6 F (36.4 C)  SpO2: 97%   Filed Weights   05/12/24 0846  Weight: 155 lb (70.3 kg)    Physical Exam Constitutional:      General: She is not in acute distress.    Appearance: She is not diaphoretic.  HENT:     Head: Normocephalic and atraumatic.     Nose: Nose normal.     Mouth/Throat:     Pharynx: No oropharyngeal exudate.  Eyes:     General: No scleral icterus.    Pupils: Pupils are equal, round, and reactive to light.  Cardiovascular:     Rate and Rhythm: Normal rate and regular rhythm.     Heart sounds: No murmur heard. Pulmonary:     Effort: Pulmonary effort is normal. No respiratory distress.     Breath sounds: Normal breath sounds. No wheezing.  Abdominal:     General: There is no distension.     Palpations: Abdomen is soft.     Tenderness: There is no abdominal tenderness.   Musculoskeletal:        General: Normal range of motion.     Cervical back: Normal range of motion and neck supple.  Skin:    General: Skin is warm and dry.     Findings: No erythema.  Neurological:     Mental Status: She is alert and oriented to person, place, and time. Mental status is at baseline.     Motor: No abnormal muscle tone.  Psychiatric:        Mood and Affect: Mood and affect normal.     LABORATORY DATA:  I have reviewed the data  as listed    Latest Ref Rng & Units 05/12/2024    8:25 AM 04/28/2024    8:32 AM 04/21/2024    8:24 AM  CBC  WBC 4.0 - 10.5 K/uL 18.1  20.4  10.2   Hemoglobin 12.0 - 15.0 g/dL 95.2  84.1  32.4   Hematocrit 36.0 - 46.0 % 34.7  44.4  40.4   Platelets 150 - 400 K/uL 248  169  218       Latest Ref Rng & Units 05/12/2024    8:25 AM 04/28/2024    8:32 AM 04/21/2024    8:24 AM  CMP  Glucose 70 - 99 mg/dL 401  027  253   BUN 6 - 20 mg/dL 19  20  17    Creatinine 0.44 - 1.00 mg/dL 6.64  4.03  4.74   Sodium 135 - 145 mmol/L 134  135  139   Potassium 3.5 - 5.1 mmol/L 3.5  3.5  3.8   Chloride 98 - 111 mmol/L 105  100  106   CO2 22 - 32 mmol/L 19  21  19    Calcium  8.9 - 10.3 mg/dL 8.9  9.4  9.7   Total Protein 6.5 - 8.1 g/dL 6.8  7.2  7.3   Total Bilirubin 0.0 - 1.2 mg/dL 1.0  1.1  0.9   Alkaline Phos 38 - 126 U/L 63  92  60   AST 15 - 41 U/L 15  26  19    ALT 0 - 44 U/L 30  18  27       RADIOGRAPHIC STUDIES: I have personally reviewed the radiological images as listed and agreed with the findings in the report. No results found.

## 2024-05-12 NOTE — Assessment & Plan Note (Signed)
 Treatment plan as listed above.

## 2024-05-12 NOTE — Progress Notes (Signed)
 Nutrition Follow-up:  Patient with left breast cancer.  Receiving docetaxel  and cytoxan  due to oncotype score.    Met with patient and husband during infusion.  Reports that she did not have any nausea or diarrhea from cycle 1.  Fatigue for 1-2 days following treatment then back to normal activities.  Concerned about elevated blood glucose.  MD cutting steroids following treatment to help blood glucose.      Medications: reviewed  Labs: glucose 214, Na 134  Anthropometrics:   Weight 155 lb   159 lb on 5/7 165 lb on 4/2 168 lb on 2/20   NUTRITION DIAGNOSIS: Food and nutrition related knowledge deficit improved   INTERVENTION:  Discussed Plate Method for Diabetes.  Handout provided     MONITORING, EVALUATION, GOAL: weight trends, intake   NEXT VISIT: Wednesday, June 18 during infusion  Briana Newman B. Zollie Hipp, CSO, LDN Registered Dietitian [phone]

## 2024-05-12 NOTE — Assessment & Plan Note (Signed)
 Recommend diabetic diet and monitor blood glucose.  higher glucose level due to steroid use. Recommend to decrease the Dexamethasone  dosage to 4mg  daily for 2 days after chemotherapy.  Follow up with PCP for management. On Metformin  and Farxiga

## 2024-05-12 NOTE — Patient Instructions (Signed)
 [facility] - A DEPT OF [facility]  Discharge Instructions: Thank you for choosing [facility] to provide your oncology and hematology care.  If you have a lab appointment with the Cancer Center, please go directly to the Cancer Center and check in at the registration area.  Wear comfortable clothing and clothing appropriate for easy access to any Portacath or PICC line.   We strive to give you quality time with your provider. You may need to reschedule your appointment if you arrive late (15 or more minutes).  Arriving late affects you and other patients whose appointments are after yours.  Also, if you miss three or more appointments without notifying the office, you may be dismissed from the clinic at the provider's discretion.      For prescription refill requests, have your pharmacy contact our office and allow 72 hours for refills to be completed.    Today you received the following chemotherapy and/or immunotherapy agents Taxotere & Cytoxan      To help prevent nausea and vomiting after your treatment, we encourage you to take your nausea medication as directed.  BELOW ARE SYMPTOMS THAT SHOULD BE REPORTED IMMEDIATELY: *FEVER GREATER THAN 100.4 F (38 C) OR HIGHER *CHILLS OR SWEATING *NAUSEA AND VOMITING THAT IS NOT CONTROLLED WITH YOUR NAUSEA MEDICATION *UNUSUAL SHORTNESS OF BREATH *UNUSUAL BRUISING OR BLEEDING *URINARY PROBLEMS (pain or burning when urinating, or frequent urination) *BOWEL PROBLEMS (unusual diarrhea, constipation, pain near the anus) TENDERNESS IN MOUTH AND THROAT WITH OR WITHOUT PRESENCE OF ULCERS (sore throat, sores in mouth, or a toothache) UNUSUAL RASH, SWELLING OR PAIN  UNUSUAL VAGINAL DISCHARGE OR ITCHING   Items with * indicate a potential emergency and should be followed up as soon as possible or go to the Emergency Department if any problems should occur.  Please show the CHEMOTHERAPY ALERT CARD or  IMMUNOTHERAPY ALERT CARD at check-in to the Emergency Department and triage nurse.  Should you have questions after your visit or need to cancel or reschedule your appointment, please contact [facility] - A DEPT OF [facility]  [phone] and follow the prompts.  Office hours are 8:00 a.m. to 4:30 p.m. Monday - Friday. Please note that voicemails left after 4:00 p.m. may not be returned until the following business day.  We are closed weekends and major holidays. You have access to a nurse at all times for urgent questions. Please call the main number to the clinic [phone] and follow the prompts.  For any non-urgent questions, you may also contact your provider using MyChart. We now offer e-Visits for anyone 83 and older to request care online for non-urgent symptoms. For details visit [email or web address].   Also download the MyChart app! Go to the app store, search "MyChart", open the app, select [facility], and log in with your MyChart username and password.

## 2024-05-14 ENCOUNTER — Inpatient Hospital Stay

## 2024-05-14 DIAGNOSIS — C50912 Malignant neoplasm of unspecified site of left female breast: Secondary | ICD-10-CM

## 2024-05-14 DIAGNOSIS — Z5111 Encounter for antineoplastic chemotherapy: Secondary | ICD-10-CM | POA: Diagnosis not present

## 2024-05-14 MED ORDER — PEGFILGRASTIM-JMDB 6 MG/0.6ML ~~LOC~~ SOSY
6.0000 mg | PREFILLED_SYRINGE | Freq: Once | SUBCUTANEOUS | Status: AC
  Administered 2024-05-14: 6 mg via SUBCUTANEOUS
  Filled 2024-05-14: qty 0.6

## 2024-05-25 ENCOUNTER — Ambulatory Visit: Payer: Self-pay | Admitting: Licensed Clinical Social Worker

## 2024-05-25 ENCOUNTER — Encounter: Payer: Self-pay | Admitting: Licensed Clinical Social Worker

## 2024-05-25 ENCOUNTER — Telehealth: Payer: Self-pay | Admitting: Licensed Clinical Social Worker

## 2024-05-25 DIAGNOSIS — Z1379 Encounter for other screening for genetic and chromosomal anomalies: Secondary | ICD-10-CM

## 2024-05-25 NOTE — Telephone Encounter (Signed)
 I contacted Ms. Dilks to discuss her genetic testing results. RNA analysis was not completed due to insufficient sample quality. Only DNA results are included in this final report We offered to have a new blood specimen sent for RNA analysis, Ms. Moynahan declined.No pathogenic variants were identified in the 40 genes analyzed. Detailed clinic note to follow.   The test report has been scanned into EPIC and is located under the Molecular Pathology section of the Results Review tab.  A portion of the result report is included below for reference.      Valri Gee, MS, [facility] Genetic Counselor [email or web address] Phone: [phone]

## 2024-05-25 NOTE — Progress Notes (Signed)
 HPI:   Zoe [name] was previously seen in the  Cancer Genetics clinic due to a personal and family history of cancer and concerns regarding a hereditary predisposition to cancer. Please refer to our prior cancer genetics clinic note for more information regarding our discussion, assessment and recommendations, at the time. Zoe [name] recent genetic test results were disclosed to her, as were recommendations warranted by these results. These results and recommendations are discussed in more detail below.  CANCER HISTORY:  Oncology History  Invasive ductal carcinoma of breast, female, left (HCC)  02/17/2024 Mammogram   Bilateral screening mammogram showed  FINDINGS: In the left breast, a possible mass warrants further evaluation. In the right breast, no findings suspicious for malignancy.   IMPRESSION: Further evaluation is suggested for a possible mass in the left breast.    02/24/2024 Mammogram   Unilateral left breast diagnostic mammogram  1. There is an 8 mm mass at the site of screening mammographic concern which is highly suspicious for malignancy. Recommend ultrasound-guided biopsy for definitive characterization. 2. No suspicious LEFT axillary adenopathy.   03/08/2024 Initial Diagnosis   Invasive ductal carcinoma of breast, female, left   Patient underwent left breast mass biopsy Pathology showed  1. Breast, left, needle core biopsy, 8 o'clock, 8cmfn :       - INVASIVE DUCTAL CARCINOMA, GRADE 2, EXTENDING TO 0.6 CM IN MAXIMUM EXTENT, AND       INVOLVING TWO OF TWO BIOPSY FRAGMENTS.  SEE COMMENT.       - TUBULE FORMATION: SCORE 3       - NUCLEAR PLEOMORPHISM: SCORE 2       - MITOTIC COUNT: SCORE 1       - TOTAL SCORE: 6       - OVERALL GRADE:  2       - LYMPHOVASCULAR INVASION: NOT IDENTIFIED       - CANCER LENGTH: 6 MM       - CALCIFICATIONS: NOT IDENTIFIED       - LYMPHOVASCULAR INVASION: NOT IDENTIFIED.   ER + 99% PR + 30%, Her2  negative (1+)     Menarche at age of 89 First live birth at age of 8 OCP use: >5 years, then switched to IUD History of hysterectomy: no Menopausal status: unsure History of HRT use: No History of chest radiation: no  Number of previous breast biopsies:  no  Denies family history of cancer, except that her father's cousin has breast cancer.    03/08/2024 Cancer Staging   Staging form: Breast, AJCC 8th Edition - Clinical stage from 03/08/2024: Stage IA (cT1b, cN0, cM0, G2, ER+, PR+, HER2-) - Signed by Timmy Forbes, MD on 03/08/2024 Stage prefix: Initial diagnosis Histologic grading system: 3 grade system   03/17/2024 Surgery   Patient underwent left breast lumpectomy and SLNB  1. Breast, lumpectomy, left breast mass :       - INVASIVE MAMMARY CARCINOMA OF NO SPECIAL TYPE (DUCTAL).       - DUCTAL CARCINOMA IN SITU (DCIS).       - SEE CANCER SUMMARY BELOW.       - PRIOR BIOPSY SITE CHANGE WITH CLIP.       - SCOUT TAG IS PRESENT.        2. Lymph node, sentinel, biopsy, left axillary sentinel lymph node #1 :       - ONE LYMPH NODE NEGATIVE FOR MALIGNANCY (0/1).        3. Lymph node, sentinel,  biopsy, left axillary sentinel lymph node #2 :       - ONE LYMPH NODE NEGATIVE FOR MALIGNANCY (0/1).    TUMOR Histologic Type: Invasive carcinoma of no special type (ductal) Histologic Grade (Nottingham Histologic Score) Glandular (Acinar)/Tubular Differentiation: 3 Nuclear Pleomorphism: 2 Mitotic Rate: 3 Overall Grade: 3 Tumor Size: Greatest dimension of largest invasive focus: 12 mm Ductal Carcinoma In Situ (DCIS): Present, intermediate to high-grade Tumor Extent: Not applicable Lymphatic and/or Vascular Invasion: Not identified Treatment Effect in the Breast: No known presurgical therapy  MARGINS Margin Status for Invasive Carcinoma: All margins negative for invasive carcinoma Distance from closest margin: 10 mm Specify closest margin: Anterior Margin Status for DCIS: All margins negative for  DCIS Distance from DCIS to closest margin: 0.1 mm Specify closest margin: Anterior                      REGIONAL LYMPH NODES Regional Lymph Node Status: All regional lymph nodes negative for tumor Total Number of Lymph Nodes Examined (sentinel and non-sentinel): 2 Number of Sentinel Nodes Examined: 2  PATHOLOGIC STAGE CLASSIFICATION (pTNM, AJCC 8th Edition):  Modified Classification: Not applicable  pT Category: pT1c  T Suffix: Not applicable  pN Category: pN0  N Suffix: (sn)  pM Category: Not applicable     04/07/2024 Cancer Staging   Staging form: Breast, AJCC 8th Edition - Pathologic stage from 04/07/2024: Stage IA (pT1c, pN0, cM0, G3, ER+, PR+, HER2-, Oncotype DX score: 28) - Signed by Timmy Forbes, MD on 04/07/2024 Stage prefix: Initial diagnosis Multigene prognostic tests performed: Oncotype DX Recurrence score range: Greater than or equal to 11 Histologic grading system: 3 grade system   04/21/2024 -  Chemotherapy   Patient is on Treatment Plan : BREAST TC q21d      Genetic Testing   Negative genetic testing. No pathogenic variants identified on the [employer]. The report date is 05/24/2024.  The [facility] Panel includes sequencing, rearrangement analysis, and RNA analysis for the following 40 genes: APC, ATM, BAP1, BARD1, BMPR1A, BRCA1, BRCA2, BRIP1, CDH1, CDKN2A, CHEK2, FH, FLCN, MET, MLH1, MSH2, MSH6, MUTYH, NF1, NTHL1, PALB2, PMS2, PTEN, RAD51C, RAD51D, RPS20, SMAD4, STK11, TP53, TSC1, TSC2, and VHL (sequencing and deletion/duplication); AXIN2, HOXB13, MBD4, MSH3, POLD1 and POLE (sequencing only); EPCAM and GREM1 (deletion/duplication only).     FAMILY HISTORY:  We obtained a detailed, 4-generation family history.  Significant diagnoses are listed below: Family History  Problem Relation Age of Onset   Hyperlipidemia Mother    Hypertension Mother    Heart disease Father        died from either MI or stroke   Diabetes Brother        TYPE 2   Heart  disease Paternal Grandfather    Diabetes Other        TYPE 2   Cancer Neg Hx    Breast cancer Neg Hx    Zoe [name] has 1 son, 5 and 1 daughter, 44. She has 2 brothers and 1 sister, no cancer history.   No known cancers on maternal side of the family, but she does report limited information on that side.   Two of father's first cousins had cancer: one had uterine in her 30s, and another had lung in her 19s. No other known cancers on this side of the family.   Zoe [name] is unaware of previous family history of genetic testing for hereditary cancer risks. There is no reported Ashkenazi Jewish ancestry. There  is no known consanguinity.     GENETIC TEST RESULTS:  The [facility] Panel found no pathogenic mutations.   The [facility] Panel includes sequencing, rearrangement analysis, and RNA analysis for the following 40 genes: APC, ATM, BAP1, BARD1, BMPR1A, BRCA1, BRCA2, BRIP1, CDH1, CDKN2A, CHEK2, FH, FLCN, MET, MLH1, MSH2, MSH6, MUTYH, NF1, NTHL1, PALB2, PMS2, PTEN, RAD51C, RAD51D, RPS20, SMAD4, STK11, TP53, TSC1, TSC2, and VHL (sequencing and deletion/duplication); AXIN2, HOXB13, MBD4, MSH3, POLD1 and POLE (sequencing only); EPCAM and GREM1 (deletion/duplication only).  The test report has been scanned into EPIC and is located under the Molecular Pathology section of the Results Review tab.  A portion of the result report is included below for reference. Genetic testing reported out on 05/24/2024.      *RNA analysis was not completed due to insufficient sample quality. Only DNA results are included in this final report. A new may be sent for RNA analysis. This will be done at no additional charge. Zoe [name] declined sending in a new sample.   Even though a pathogenic variant was not identified, possible explanations for the cancer in the family may include: There may be no hereditary risk for cancer in the family. The cancers in Zoe [name] and/or her family may be  sporadic/familial or due to other genetic and environmental factors. There may be a gene mutation in one of these genes that current testing methods cannot detect but that chance is small. There could be another gene that has not yet been discovered, or that we have not yet tested, that is responsible for the cancer diagnoses in the family.  It is also possible there is a hereditary cause for the cancer in the family that Zoe [name] did not inherit.  Therefore, it is important to remain in touch with cancer genetics in the future so that we can continue to offer Zoe [name] the most up to date genetic testing.   ADDITIONAL GENETIC TESTING:  We discussed with Zoe [name] that her genetic testing was fairly extensive.  If there are additional relevant genes identified to increase cancer risk that can be analyzed in the future, we would be happy to discuss and coordinate this testing at that time.    CANCER SCREENING RECOMMENDATIONS:  Zoe [name] test result is considered negative (normal).  This means that we have not identified a hereditary cause for her personal and family history of cancer at this time.   An individual's cancer risk and medical management are not determined by genetic test results alone. Overall cancer risk assessment incorporates additional factors, including personal medical history, family history, and any available genetic information that may result in a personalized plan for cancer prevention and surveillance. Therefore, it is recommended she continue to follow the cancer management and screening guidelines provided by her oncology and primary healthcare provider.  RECOMMENDATIONS FOR FAMILY MEMBERS:   Since she did not inherit a identifiable mutation in a cancer predisposition gene included on this panel, her children could not have inherited a known mutation from her in one of these genes. Individuals in this family might be at some increased risk of developing  cancer, over the general population risk, due to the family history of cancer.  Individuals in the family should notify their providers of the family history of cancer. We recommend women in this family have a yearly mammogram beginning at age 61, or 73 years younger than the earliest onset of cancer, an annual clinical breast exam, and perform monthly breast  self-exams.  Family members should have colonoscopies by at age 29, or earlier, as recommended by their providers. Other members of the family may still carry a pathogenic variant in one of these genes that Zoe [name] did not inherit. Based on the family history, we recommend those related to her father's cousin who had uterine cancer in her 30s have genetic counseling and testing. Zoe [name] will let us  know if we can be of any assistance in coordinating genetic counseling and/or testing for this family member.     FOLLOW-UP:  Lastly, we discussed with Zoe [name] that cancer genetics is a rapidly advancing field and it is possible that new genetic tests will be appropriate for her and/or her family members in the future. We encouraged her to remain in contact with cancer genetics on an annual basis so we can update her personal and family histories and let her know of advances in cancer genetics that may benefit this family.   Our contact number was provided. Zoe [name] questions were answered to her satisfaction, and she knows she is welcome to call us  at anytime with additional questions or concerns.    Valri Gee, MS, [facility] Genetic Counselor [email or web address] Phone: [phone]

## 2024-06-02 ENCOUNTER — Inpatient Hospital Stay (HOSPITAL_BASED_OUTPATIENT_CLINIC_OR_DEPARTMENT_OTHER): Admitting: Oncology

## 2024-06-02 ENCOUNTER — Inpatient Hospital Stay

## 2024-06-02 ENCOUNTER — Encounter: Payer: Self-pay | Admitting: Oncology

## 2024-06-02 ENCOUNTER — Inpatient Hospital Stay: Attending: Oncology

## 2024-06-02 VITALS — BP 116/76 | HR 68 | Temp 97.7°F | Resp 16 | Wt 151.0 lb

## 2024-06-02 DIAGNOSIS — Z79633 Long term (current) use of mitotic inhibitor: Secondary | ICD-10-CM | POA: Diagnosis not present

## 2024-06-02 DIAGNOSIS — C50912 Malignant neoplasm of unspecified site of left female breast: Secondary | ICD-10-CM | POA: Diagnosis not present

## 2024-06-02 DIAGNOSIS — Z170 Estrogen receptor positive status [ER+]: Secondary | ICD-10-CM | POA: Diagnosis present

## 2024-06-02 DIAGNOSIS — E1165 Type 2 diabetes mellitus with hyperglycemia: Secondary | ICD-10-CM | POA: Diagnosis not present

## 2024-06-02 DIAGNOSIS — Z5111 Encounter for antineoplastic chemotherapy: Secondary | ICD-10-CM | POA: Diagnosis not present

## 2024-06-02 DIAGNOSIS — C50812 Malignant neoplasm of overlapping sites of left female breast: Secondary | ICD-10-CM | POA: Diagnosis present

## 2024-06-02 DIAGNOSIS — Z79630 Long term (current) use of alkylating agent: Secondary | ICD-10-CM | POA: Insufficient documentation

## 2024-06-02 DIAGNOSIS — Z5189 Encounter for other specified aftercare: Secondary | ICD-10-CM | POA: Diagnosis not present

## 2024-06-02 LAB — CBC WITH DIFFERENTIAL (CANCER CENTER ONLY)
Abs Immature Granulocytes: 0.24 10*3/uL — ABNORMAL HIGH (ref 0.00–0.07)
Basophils Absolute: 0 10*3/uL (ref 0.0–0.1)
Basophils Relative: 0 %
Eosinophils Absolute: 0 10*3/uL (ref 0.0–0.5)
Eosinophils Relative: 0 %
HCT: 36.8 % (ref 36.0–46.0)
Hemoglobin: 12.1 g/dL (ref 12.0–15.0)
Immature Granulocytes: 2 %
Lymphocytes Relative: 8 %
Lymphs Abs: 1 10*3/uL (ref 0.7–4.0)
MCH: 27.7 pg (ref 26.0–34.0)
MCHC: 32.9 g/dL (ref 30.0–36.0)
MCV: 84.2 fL (ref 80.0–100.0)
Monocytes Absolute: 0.6 10*3/uL (ref 0.1–1.0)
Monocytes Relative: 4 %
Neutro Abs: 11.2 10*3/uL — ABNORMAL HIGH (ref 1.7–7.7)
Neutrophils Relative %: 86 %
Platelet Count: 232 10*3/uL (ref 150–400)
RBC: 4.37 MIL/uL (ref 3.87–5.11)
RDW: 17.2 % — ABNORMAL HIGH (ref 11.5–15.5)
WBC Count: 13.1 10*3/uL — ABNORMAL HIGH (ref 4.0–10.5)
nRBC: 0 % (ref 0.0–0.2)

## 2024-06-02 LAB — CMP (CANCER CENTER ONLY)
ALT: 28 U/L (ref 0–44)
AST: 19 U/L (ref 15–41)
Albumin: 4.5 g/dL (ref 3.5–5.0)
Alkaline Phosphatase: 69 U/L (ref 38–126)
Anion gap: 11 (ref 5–15)
BUN: 22 mg/dL — ABNORMAL HIGH (ref 6–20)
CO2: 21 mmol/L — ABNORMAL LOW (ref 22–32)
Calcium: 9.5 mg/dL (ref 8.9–10.3)
Chloride: 104 mmol/L (ref 98–111)
Creatinine: 0.5 mg/dL (ref 0.44–1.00)
GFR, Estimated: >60 mL/min (ref >60)
Glucose, Bld: 183 mg/dL — ABNORMAL HIGH (ref 70–99)
Potassium: 3.7 mmol/L (ref 3.5–5.1)
Sodium: 136 mmol/L (ref 135–145)
Total Bilirubin: 1.3 mg/dL — ABNORMAL HIGH (ref 0.0–1.2)
Total Protein: 7.5 g/dL (ref 6.5–8.1)

## 2024-06-02 MED ORDER — SODIUM CHLORIDE 0.9 % IV SOLN
75.0000 mg/m2 | Freq: Once | INTRAVENOUS | Status: AC
  Administered 2024-06-02: 137 mg via INTRAVENOUS
  Filled 2024-06-02: qty 13.7

## 2024-06-02 MED ORDER — SODIUM CHLORIDE 0.9 % IV SOLN
INTRAVENOUS | Status: DC
  Filled 2024-06-02: qty 250

## 2024-06-02 MED ORDER — SODIUM CHLORIDE 0.9 % IV SOLN
600.0000 mg/m2 | Freq: Once | INTRAVENOUS | Status: AC
  Administered 2024-06-02: 1000 mg via INTRAVENOUS
  Filled 2024-06-02: qty 50

## 2024-06-02 MED ORDER — HEPARIN SOD (PORK) LOCK FLUSH 100 UNIT/ML IV SOLN
500.0000 [IU] | Freq: Once | INTRAVENOUS | Status: DC | PRN
Start: 2024-06-02 — End: 2024-06-02
  Filled 2024-06-02: qty 5

## 2024-06-02 MED ORDER — PALONOSETRON HCL INJECTION 0.25 MG/5ML
0.2500 mg | Freq: Once | INTRAVENOUS | Status: AC
  Administered 2024-06-02: 0.25 mg via INTRAVENOUS

## 2024-06-02 MED ORDER — DEXAMETHASONE SODIUM PHOSPHATE 10 MG/ML IJ SOLN
10.0000 mg | Freq: Once | INTRAMUSCULAR | Status: AC
  Administered 2024-06-02: 10 mg via INTRAVENOUS

## 2024-06-02 NOTE — Assessment & Plan Note (Signed)
 Treatment plan as listed above.

## 2024-06-02 NOTE — Assessment & Plan Note (Signed)
 Left breast invasive ductal carcinoma, pT1b cN0 ER 99%+/PR 30%+, HER2 low (1+), Oncotype DX RS 28, >15% chemotherapy benefit.  Recommend adjuvant chemotherapy.  Given her history of cardiovascular disease including CAD, NSTEMI, I recommend non anthracycline regimen with TC Q3 weeks x 4 cycles.  Labs are reviewed and discussed with patient. Proceed with  cycle 3 TC with D3 GCSF.

## 2024-06-02 NOTE — Patient Instructions (Signed)
 [facility] - A DEPT OF [facility]  Discharge Instructions: Thank you for choosing [facility] to provide your oncology and hematology care.  If you have a lab appointment with the Cancer Center, please go directly to the Cancer Center and check in at the registration area.  Wear comfortable clothing and clothing appropriate for easy access to any Portacath or PICC line.   We strive to give you quality time with your provider. You may need to reschedule your appointment if you arrive late (15 or more minutes).  Arriving late affects you and other patients whose appointments are after yours.  Also, if you miss three or more appointments without notifying the office, you may be dismissed from the clinic at the provider's discretion.      For prescription refill requests, have your pharmacy contact our office and allow 72 hours for refills to be completed.    Today you received the following chemotherapy and/or immunotherapy agents TAXOTERE and CYTOXAN      To help prevent nausea and vomiting after your treatment, we encourage you to take your nausea medication as directed.  BELOW ARE SYMPTOMS THAT SHOULD BE REPORTED IMMEDIATELY: *FEVER GREATER THAN 100.4 F (38 C) OR HIGHER *CHILLS OR SWEATING *NAUSEA AND VOMITING THAT IS NOT CONTROLLED WITH YOUR NAUSEA MEDICATION *UNUSUAL SHORTNESS OF BREATH *UNUSUAL BRUISING OR BLEEDING *URINARY PROBLEMS (pain or burning when urinating, or frequent urination) *BOWEL PROBLEMS (unusual diarrhea, constipation, pain near the anus) TENDERNESS IN MOUTH AND THROAT WITH OR WITHOUT PRESENCE OF ULCERS (sore throat, sores in mouth, or a toothache) UNUSUAL RASH, SWELLING OR PAIN  UNUSUAL VAGINAL DISCHARGE OR ITCHING   Items with * indicate a potential emergency and should be followed up as soon as possible or go to the Emergency Department if any problems should occur.  Please show the CHEMOTHERAPY ALERT CARD or  IMMUNOTHERAPY ALERT CARD at check-in to the Emergency Department and triage nurse.  Should you have questions after your visit or need to cancel or reschedule your appointment, please contact [facility] - A DEPT OF [facility]  [phone] and follow the prompts.  Office hours are 8:00 a.m. to 4:30 p.m. Monday - Friday. Please note that voicemails left after 4:00 p.m. may not be returned until the following business day.  We are closed weekends and major holidays. You have access to a nurse at all times for urgent questions. Please call the main number to the clinic [phone] and follow the prompts.  For any non-urgent questions, you may also contact your provider using MyChart. We now offer e-Visits for anyone 26 and older to request care online for non-urgent symptoms. For details visit [email or web address].   Also download the MyChart app! Go to the app store, search "MyChart", open the app, select [facility], and log in with your MyChart username and password.  Docetaxel Injection What is this medication? DOCETAXEL (doe se TAX el) treats some types of cancer. It works by slowing down the growth of cancer cells. This medicine may be used for other purposes; ask your health care provider or pharmacist if you have questions. COMMON BRAND NAME(S): BEIZRAY, Docefrez, Docivyx, Taxotere What should I tell my care team before I take this medication? They need to know if you have any of these conditions: Kidney disease Liver disease Low white blood cell levels Tingling of the fingers or toes or other nerve disorder An unusual  or allergic reaction to docetaxel, polysorbate 80, other medications, foods, dyes, or preservatives Pregnant or trying to get pregnant Breast-feeding How should I use this medication? This medication is injected into a vein. It is given by your care team in a hospital or clinic setting. Talk to your care team about the use of this  medication in children. Special care may be needed. Overdosage: If you think you have taken too much of this medicine contact a poison control center or emergency room at once. NOTE: This medicine is only for you. Do not share this medicine with others. What if I miss a dose? Keep appointments for follow-up doses. It is important not to miss your dose. Call your care team if you are unable to keep an appointment. What may interact with this medication? Do not take this medication with any of the following: Live virus vaccines This medication may also interact with the following: Certain antibiotics, such as clarithromycin, telithromycin Certain antivirals for HIV or hepatitis Certain medications for fungal infections, such as itraconazole, ketoconazole, voriconazole Grapefruit juice Nefazodone Supplements, such as St. John's wort This list may not describe all possible interactions. Give your health care provider a list of all the medicines, herbs, non-prescription drugs, or dietary supplements you use. Also tell them if you smoke, drink alcohol, or use illegal drugs. Some items may interact with your medicine. What should I watch for while using this medication? This medication may make you feel generally unwell. This is not uncommon as chemotherapy can affect healthy cells as well as cancer cells. Report any side effects. Continue your course of treatment even though you feel ill unless your care team tells you to stop. You may need blood work done while you are taking this medication. This medication can cause serious side effects and infusion reactions. To reduce the risk, your care team may give you other medications to take before receiving this one. Be sure to follow the directions from your care team. This medication may increase your risk of getting an infection. Call your care team for advice if you get a fever, chills, sore throat, or other symptoms of a cold or flu. Do not treat  yourself. Try to avoid being around people who are sick. Avoid taking medications that contain aspirin, acetaminophen, ibuprofen, naproxen, or ketoprofen unless instructed by your care team. These medications may hide a fever. Be careful brushing or flossing your teeth or using a toothpick because you may get an infection or bleed more easily. If you have any dental work done, tell your dentist you are receiving this medication. Some products may contain alcohol. Ask your care team if this medication contains alcohol. Be sure to tell all care teams you are taking this medicine. Certain medications, like metronidazole and disulfiram, can cause an unpleasant reaction when taken with alcohol. The reaction includes flushing, headache, nausea, vomiting, sweating, and increased thirst. The reaction can last from 30 minutes to several hours. This medication may affect your coordination, reaction time, or judgement. Do not drive or operate machinery until you know how this medication affects you. Sit up or stand slowly to reduce the risk of dizzy or fainting spells. Drinking alcohol with this medication can increase the risk of these side effects. Talk to your care team about your risk of cancer. You may be more at risk for certain types of cancer if you take this medication. Talk to your care team if you wish to become pregnant or think you might be  pregnant. This medication can cause serious birth defects if taken during pregnancy or if you get pregnant within 2 months after stopping therapy. A negative pregnancy test is required before starting this medication. A reliable form of contraception is recommended while taking this medication and for 2 months after stopping it. Talk to your care team about reliable forms of contraception. Do not breast-feed while taking this medication and for 1 week after stopping therapy. Use a condom during sex and for 4 months after stopping therapy. Tell your care team right away  if you think your partner might be pregnant. This medication can cause serious birth defects. This medication may cause infertility. Talk to your care team if you are concerned about your fertility. What side effects may I notice from receiving this medication? Side effects that you should report to your care team as soon as possible: Allergic reactions--skin rash, itching, hives, swelling of the face, lips, tongue, or throat Change in vision such as blurry vision, seeing halos around lights, vision loss Infection--fever, chills, cough, or sore throat Infusion reactions--chest pain, shortness of breath or trouble breathing, feeling faint or lightheaded Low red blood cell level--unusual weakness or fatigue, dizziness, headache, trouble breathing Pain, tingling, or numbness in the hands or feet Painful swelling, warmth, or redness of the skin, blisters or sores at the infusion site Redness, blistering, peeling, or loosening of the skin, including inside the mouth Sudden or severe stomach pain, bloody diarrhea, fever, nausea, vomiting Swelling of the ankles, hands, or feet Tumor lysis syndrome (TLS)--nausea, vomiting, diarrhea, decrease in the amount of urine, dark urine, unusual weakness or fatigue, confusion, muscle pain or cramps, fast or irregular heartbeat, joint pain Unusual bruising or bleeding Side effects that usually do not require medical attention (report to your care team if they continue or are bothersome): Change in nail shape, thickness, or color Change in taste Hair loss Increased tears This list may not describe all possible side effects. Call your doctor for medical advice about side effects. You may report side effects to FDA at 1-800-FDA-1088. Where should I keep my medication? This medication is given in a hospital or clinic. It will not be stored at home. NOTE: This sheet is a summary. It may not cover all possible information. If you have questions about this medicine,  talk to your doctor, pharmacist, or health care provider.  2024 Elsevier/Gold Standard (2022-02-07 00:00:00)  Cyclophosphamide Injection What is this medication? CYCLOPHOSPHAMIDE (sye kloe FOSS fa mide) treats some types of cancer. It works by slowing down the growth of cancer cells. This medicine may be used for other purposes; ask your health care provider or pharmacist if you have questions. COMMON BRAND NAME(S): Cyclophosphamide, Cytoxan, Neosar What should I tell my care team before I take this medication? They need to know if you have any of these conditions: Heart disease Irregular heartbeat or rhythm Infection Kidney problems Liver disease Low blood cell levels (white cells, platelets, or red blood cells) Lung disease Previous radiation Trouble passing urine An unusual or allergic reaction to cyclophosphamide, other medications, foods, dyes, or preservatives Pregnant or trying to get pregnant Breast-feeding How should I use this medication? This medication is injected into a vein. It is given by your care team in a hospital or clinic setting. Talk to your care team about the use of this medication in children. Special care may be needed. Overdosage: If you think you have taken too much of this medicine contact a poison control center or emergency  room at once. NOTE: This medicine is only for you. Do not share this medicine with others. What if I miss a dose? Keep appointments for follow-up doses. It is important not to miss your dose. Call your care team if you are unable to keep an appointment. What may interact with this medication? Amphotericin B Amiodarone Azathioprine Certain antivirals for HIV or hepatitis Certain medications for blood pressure, such as enalapril, lisinopril, quinapril Cyclosporine Diuretics Etanercept Indomethacin Medications that relax muscles Metronidazole Natalizumab Tamoxifen Warfarin This list may not describe all possible  interactions. Give your health care provider a list of all the medicines, herbs, non-prescription drugs, or dietary supplements you use. Also tell them if you smoke, drink alcohol, or use illegal drugs. Some items may interact with your medicine. What should I watch for while using this medication? This medication may make you feel generally unwell. This is not uncommon as chemotherapy can affect healthy cells as well as cancer cells. Report any side effects. Continue your course of treatment even though you feel ill unless your care team tells you to stop. You may need blood work while you are taking this medication. This medication may increase your risk of getting an infection. Call your care team for advice if you get a fever, chills, sore throat, or other symptoms of a cold or flu. Do not treat yourself. Try to avoid being around people who are sick. Avoid taking medications that contain aspirin, acetaminophen, ibuprofen, naproxen, or ketoprofen unless instructed by your care team. These medications may hide a fever. Be careful brushing or flossing your teeth or using a toothpick because you may get an infection or bleed more easily. If you have any dental work done, tell your dentist you are receiving this medication. Drink water or other fluids as directed. Urinate often, even at night. Some products may contain alcohol. Ask your care team if this medication contains alcohol. Be sure to tell all care teams you are taking this medicine. Certain medicines, like metronidazole and disulfiram, can cause an unpleasant reaction when taken with alcohol. The reaction includes flushing, headache, nausea, vomiting, sweating, and increased thirst. The reaction can last from 30 minutes to several hours. Talk to your care team if you wish to become pregnant or think you might be pregnant. This medication can cause serious birth defects if taken during pregnancy and for 1 year after the last dose. A negative  pregnancy test is required before starting this medication. A reliable form of contraception is recommended while taking this medication and for 1 year after the last dose. Talk to your care team about reliable forms of contraception. Do not father a child while taking this medication and for 4 months after the last dose. Use a condom during this time period. Do not breast-feed while taking this medication or for 1 week after the last dose. This medication may cause infertility. Talk to your care team if you are concerned about your fertility. Talk to your care team about your risk of cancer. You may be more at risk for certain types of cancer if you take this medication. What side effects may I notice from receiving this medication? Side effects that you should report to your care team as soon as possible: Allergic reactions--skin rash, itching, hives, swelling of the face, lips, tongue, or throat Dry cough, shortness of breath or trouble breathing Heart failure--shortness of breath, swelling of the ankles, feet, or hands, sudden weight gain, unusual weakness or fatigue Heart muscle inflammation--unusual  weakness or fatigue, shortness of breath, chest pain, fast or irregular heartbeat, dizziness, swelling of the ankles, feet, or hands Heart rhythm changes--fast or irregular heartbeat, dizziness, feeling faint or lightheaded, chest pain, trouble breathing Infection--fever, chills, cough, sore throat, wounds that don't heal, pain or trouble when passing urine, general feeling of discomfort or being unwell Kidney injury--decrease in the amount of urine, swelling of the ankles, hands, or feet Liver injury--right upper belly pain, loss of appetite, nausea, light-colored stool, dark yellow or brown urine, yellowing skin or eyes, unusual weakness or fatigue Low red blood cell level--unusual weakness or fatigue, dizziness, headache, trouble breathing Low sodium level--muscle weakness, fatigue, dizziness,  headache, confusion Red or dark brown urine Unusual bruising or bleeding Side effects that usually do not require medical attention (report to your care team if they continue or are bothersome): Hair loss Irregular menstrual cycles or spotting Loss of appetite Nausea Pain, redness, or swelling with sores inside the mouth or throat Vomiting This list may not describe all possible side effects. Call your doctor for medical advice about side effects. You may report side effects to FDA at 1-800-FDA-1088. Where should I keep my medication? This medication is given in a hospital or clinic. It will not be stored at home. NOTE: This sheet is a summary. It may not cover all possible information. If you have questions about this medicine, talk to your doctor, pharmacist, or health care provider.  2024 Elsevier/Gold Standard (2022-04-19 00:00:00)

## 2024-06-02 NOTE — Progress Notes (Signed)
 Hematology/Oncology Progress note [phone] [phone]      CHIEF COMPLAINTS/PURPOSE OF CONSULTATION:  Left breast cancer  ASSESSMENT & PLAN:   Cancer Staging  Invasive ductal carcinoma of breast, female, left (HCC) Staging form: Breast, AJCC 8th Edition - Clinical stage from 03/08/2024: Stage IA (cT1b, cN0, cM0, G2, ER+, PR+, HER2-) - Signed by Timmy Forbes, MD on 03/08/2024 - Pathologic stage from 04/07/2024: Stage IA (pT1c, pN0, cM0, G3, ER+, PR+, HER2-, Oncotype DX score: 28) - Signed by Timmy Forbes, MD on 04/07/2024   Invasive ductal carcinoma of breast, female, left (HCC) Left breast invasive ductal carcinoma, pT1b cN0 ER 99%+/PR 30%+, HER2 low (1+), Oncotype DX RS 28, >15% chemotherapy benefit.  Recommend adjuvant chemotherapy.  Given her history of cardiovascular disease including CAD, NSTEMI, I recommend non anthracycline regimen with TC Q3 weeks x 4 cycles.  Labs are reviewed and discussed with patient. Proceed with  cycle 3 TC with D3 GCSF.    Type 2 diabetes mellitus with hyperglycemia, without long-term current use of insulin (HCC) Recommend diabetic diet and monitor blood glucose.  higher glucose level due to steroid use. Dexamethasone  dosage to 4mg  daily for 2 days after chemotherapy.  Follow up with PCP for management. On Metformin  and Farxiga    Encounter for antineoplastic chemotherapy Treatment plan as listed above.     No orders of the defined types were placed in this encounter.  Follow up 3 weeks  All questions were answered. The patient knows to call the clinic with any problems, questions or concerns. We spent sufficient time to discuss many aspect of care, questions were answered to patient's satisfaction.   Timmy Forbes, MD, PhD [facility] Hematology Oncology 06/02/2024    HISTORY OF PRESENTING ILLNESS:  Zoe [name] 58 y.o. female presents to establish care for left breast cancer I have reviewed her chart and materials related  to her cancer extensively and collaborated history with the patient. Summary of oncologic history is as follows: Oncology History  Invasive ductal carcinoma of breast, female, left (HCC)  02/17/2024 Mammogram   Bilateral screening mammogram showed  FINDINGS: In the left breast, a possible mass warrants further evaluation. In the right breast, no findings suspicious for malignancy.   IMPRESSION: Further evaluation is suggested for a possible mass in the left breast.    02/24/2024 Mammogram   Unilateral left breast diagnostic mammogram  1. There is an 8 mm mass at the site of screening mammographic concern which is highly suspicious for malignancy. Recommend ultrasound-guided biopsy for definitive characterization. 2. No suspicious LEFT axillary adenopathy.   03/08/2024 Initial Diagnosis   Invasive ductal carcinoma of breast, female, left   Patient underwent left breast mass biopsy Pathology showed  1. Breast, left, needle core biopsy, 8 o'clock, 8cmfn :       - INVASIVE DUCTAL CARCINOMA, GRADE 2, EXTENDING TO 0.6 CM IN MAXIMUM EXTENT, AND       INVOLVING TWO OF TWO BIOPSY FRAGMENTS.  SEE COMMENT.       - TUBULE FORMATION: SCORE 3       - NUCLEAR PLEOMORPHISM: SCORE 2       - MITOTIC COUNT: SCORE 1       - TOTAL SCORE: 6       - OVERALL GRADE:  2       - LYMPHOVASCULAR INVASION: NOT IDENTIFIED       - CANCER LENGTH: 6 MM       - CALCIFICATIONS: NOT IDENTIFIED       -  LYMPHOVASCULAR INVASION: NOT IDENTIFIED.   ER + 99% PR + 30%, Her2  negative (1+)    Menarche at age of 64 First live birth at age of 74 OCP use: >5 years, then switched to IUD History of hysterectomy: no Menopausal status: unsure History of HRT use: No History of chest radiation: no  Number of previous breast biopsies:  no  Denies family history of cancer, except that her father's cousin has breast cancer.    03/08/2024 Cancer Staging   Staging form: Breast, AJCC 8th Edition - Clinical stage from  03/08/2024: Stage IA (cT1b, cN0, cM0, G2, ER+, PR+, HER2-) - Signed by Timmy Forbes, MD on 03/08/2024 Stage prefix: Initial diagnosis Histologic grading system: 3 grade system   03/17/2024 Surgery   Patient underwent left breast lumpectomy and SLNB  1. Breast, lumpectomy, left breast mass :       - INVASIVE MAMMARY CARCINOMA OF NO SPECIAL TYPE (DUCTAL).       - DUCTAL CARCINOMA IN SITU (DCIS).       - SEE CANCER SUMMARY BELOW.       - PRIOR BIOPSY SITE CHANGE WITH CLIP.       - SCOUT TAG IS PRESENT.        2. Lymph node, sentinel, biopsy, left axillary sentinel lymph node #1 :       - ONE LYMPH NODE NEGATIVE FOR MALIGNANCY (0/1).        3. Lymph node, sentinel, biopsy, left axillary sentinel lymph node #2 :       - ONE LYMPH NODE NEGATIVE FOR MALIGNANCY (0/1).    TUMOR Histologic Type: Invasive carcinoma of no special type (ductal) Histologic Grade (Nottingham Histologic Score) Glandular (Acinar)/Tubular Differentiation: 3 Nuclear Pleomorphism: 2 Mitotic Rate: 3 Overall Grade: 3 Tumor Size: Greatest dimension of largest invasive focus: 12 mm Ductal Carcinoma In Situ (DCIS): Present, intermediate to high-grade Tumor Extent: Not applicable Lymphatic and/or Vascular Invasion: Not identified Treatment Effect in the Breast: No known presurgical therapy  MARGINS Margin Status for Invasive Carcinoma: All margins negative for invasive carcinoma Distance from closest margin: 10 mm Specify closest margin: Anterior Margin Status for DCIS: All margins negative for DCIS Distance from DCIS to closest margin: 0.1 mm Specify closest margin: Anterior                      REGIONAL LYMPH NODES Regional Lymph Node Status: All regional lymph nodes negative for tumor Total Number of Lymph Nodes Examined (sentinel and non-sentinel): 2 Number of Sentinel Nodes Examined: 2  PATHOLOGIC STAGE CLASSIFICATION (pTNM, AJCC 8th Edition):  Modified Classification: Not applicable  pT Category: pT1c  T  Suffix: Not applicable  pN Category: pN0  N Suffix: (sn)  pM Category: Not applicable     04/07/2024 Cancer Staging   Staging form: Breast, AJCC 8th Edition - Pathologic stage from 04/07/2024: Stage IA (pT1c, pN0, cM0, G3, ER+, PR+, HER2-, Oncotype DX score: 28) - Signed by Timmy Forbes, MD on 04/07/2024 Stage prefix: Initial diagnosis Multigene prognostic tests performed: Oncotype DX Recurrence score range: Greater than or equal to 11 Histologic grading system: 3 grade system   04/21/2024 -  Chemotherapy   Patient is on Treatment Plan : BREAST TC q21d      Genetic Testing   Negative genetic testing. No pathogenic variants identified on the [employer]. The report date is 05/24/2024.  The [employer] [facility] Panel includes sequencing, rearrangement analysis, and RNA analysis for the following 40 genes:  APC, ATM, BAP1, BARD1, BMPR1A, BRCA1, BRCA2, BRIP1, CDH1, CDKN2A, CHEK2, FH, FLCN, MET, MLH1, MSH2, MSH6, MUTYH, NF1, NTHL1, PALB2, PMS2, PTEN, RAD51C, RAD51D, RPS20, SMAD4, STK11, TP53, TSC1, TSC2, and VHL (sequencing and deletion/duplication); AXIN2, HOXB13, MBD4, MSH3, POLD1 and POLE (sequencing only); EPCAM and GREM1 (deletion/duplication only).    She tolerated TC with GCSF support.  No nausea vomiting diarrhea. No fever or chills. No numbness tingling sensation. No other new complaints.    MEDICAL HISTORY:  Past Medical History:  Diagnosis Date   Allergic rhinitis    Asthma    Complication of anesthesia    a.) delayed/prolonged emergence   Coronary artery disease    Dysplastic nevi 08/30/2020   Right lateral distal deltoid, left lower back post waistline. 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   T2DM (type 2 diabetes mellitus) (HCC)    Unstable angina (HCC)     SURGICAL HISTORY: Past Surgical History:  Procedure Laterality Date   AXILLARY SENTINEL NODE BIOPSY Left 03/17/2024   Procedure: BIOPSY, LYMPH NODE, SENTINEL, AXILLARY;  Surgeon: Eldred Grego, MD;  Location: [facility];  Service: General;  Laterality: Left;   BREAST BIOPSY Left 03/03/2024   US  LT BREAST Bx venus marker,   8:00 8 cmfn : - INVASIVE DUCTAL CARCINOMA   BREAST BIOPSY Left 03/11/2024   US  LT RADIO FREQUENCY TAG LOC US  GUIDE 03/11/2024 [facility]   BREAST LUMPECTOMY Left 03/17/2024   Procedure: BREAST LUMPECTOMY;  Surgeon: Eldred Grego, MD;  Location: [facility];  Service: General;  Laterality: Left;  w/ RF tag   CARDIAC CATHETERIZATION     COLONOSCOPY  2001   COLONOSCOPY WITH PROPOFOL  N/A 11/05/2019   Procedure: COLONOSCOPY WITH PROPOFOL ;  Surgeon: Irby Mannan, MD;  Location: [facility];  Service: Endoscopy;  Laterality: N/A;   CORONARY ANGIOPLASTY     DILATATION & CURETTAGE/HYSTEROSCOPY WITH MYOSURE N/A 02/29/2016   Procedure: DILATATION & CURETTAGE/HYSTEROSCOPY WITH MYOSURE;  Surgeon: Margarie Shay Ward, MD;  Location: [facility];  Service: Gynecology;  Laterality: N/A;   DILATION AND CURETTAGE OF UTERUS     EAR CYST EXCISION Left 2015   IN OFFICE   ESOPHAGOGASTRODUODENOSCOPY     INTRAUTERINE DEVICE (IUD) INSERTION N/A 02/29/2016   Procedure: INTRAUTERINE DEVICE (IUD) INSERTION;  Surgeon: Margarie Shay Ward, MD;  Location: [facility];  Service: Gynecology;  Laterality: N/A;   LEFT HEART CATH AND CORONARY ANGIOGRAPHY Left 03/16/2019   Procedure: LEFT HEART CATH AND CORONARY ANGIOGRAPHY;  Surgeon:  Cherrie Cornwall, MD;  Location: [facility] IN[facility];  Service: Cardiovascular;  Laterality: Left;    SOCIAL HISTORY: Social History   Socioeconomic History   Marital status: Married    Spouse name: Mihalik,Bobby T (Spouse)   Number of children: 2   Years of education: 18   Highest education level: Not on file  Occupational History   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  Other Topics Concern   Not on file  Social History Narrative   Not on file   Social Drivers of Health   Financial Resource Strain: Low Risk  (03/09/2024)   Received from [facility]   Overall Financial Resource Strain (CARDIA)    Difficulty of Paying Living Expenses: Not hard at all  Food Insecurity: No Food Insecurity (03/09/2024)   Received from [facility]   Hunger Vital Sign    Within the past 12 months, you worried that your food would run out before you got the money to buy more.: Never true    Within the past 12 months, the food you bought just didn't last and you didn't have money to get more.: Never true  Transportation Needs: No Transportation Needs (03/09/2024)   Received from [facility] - Transportation    In the past 12 months, has lack of transportation kept you from medical appointments or from getting medications?: No    Lack of Transportation (Non-Medical): No  Physical Activity: Not on file  Stress: Not on file  Social Connections: Not on file  Intimate Partner Violence: Not At Risk (03/08/2024)   Humiliation, Afraid, Rape, and Kick questionnaire     Fear of Current or Ex-Partner: No    Emotionally Abused: No    Physically Abused: No    Sexually Abused: No    FAMILY HISTORY: Family History  Problem Relation Age of Onset   Hyperlipidemia Mother    Hypertension Mother    Heart disease Father        died from either MI or stroke   Diabetes Brother        TYPE 2   Heart disease Paternal Grandfather    Diabetes Other        TYPE 2   Cancer Neg Hx    Breast cancer Neg Hx     ALLERGIES:  is allergic to ace inhibitors and neosporin wound cleanser [benzalkonium chloride].  MEDICATIONS:  Current Outpatient Medications  Medication Sig Dispense Refill   aspirin  81 MG tablet Take 81 mg by mouth daily.     Cholecalciferol (VITAMIN D-3 PO) Take 2,000 Units by mouth daily.     Coenzyme Q10 (CO Q 10 PO) Take 1 capsule by mouth daily.     dexamethasone  (DECADRON ) 4 MG tablet Take 2 tabs by mouth 2 times daily starting day before chemo. Then take 2 tabs daily for 2 days starting day after chemo. Take with food. 30 tablet 1   FARXIGA 10 MG TABS tablet TAKE 1 TABLET BY MOUTH EVERY DAY IN THE MORNING 30 tablet 11   ibuprofen  (ADVIL ,MOTRIN ) 600 MG tablet Take 1 tablet (600 mg total) by mouth every 6 (six) hours as needed. 30 tablet 0   isosorbide  mononitrate (IMDUR ) 30 MG 24 hr tablet TAKE 1 TABLET BY MOUTH EVERY DAY 90 tablet 2   losartan-hydrochlorothiazide  (HYZAAR) 50-12.5 MG tablet TAKE 1 TABLET BY MOUTH EVERY DAY 90 tablet  0   metFORMIN  (GLUCOPHAGE ) 500 MG tablet TAKE 1 TABLET BY MOUTH 2 TIMES DAILY WITH A MEAL. 180 tablet 1   metoprolol  succinate (TOPROL -XL) 50 MG 24 hr tablet TAKE 1 TABLET BY MOUTH EVERY DAY 90 tablet 2   Omega-3 Fatty Acids (FISH OIL) 1200 MG CAPS Take 1,200 mg by mouth daily.     ondansetron  (ZOFRAN ) 8 MG tablet Take 1 tablet (8 mg total) by mouth every 8 (eight) hours as needed for nausea or vomiting. Start on the third day after chemotherapy. 30 tablet 3   pantoprazole  (PROTONIX ) 40 MG tablet TAKE 1 TABLET BY MOUTH  EVERY DAY 90 tablet 1   prochlorperazine  (COMPAZINE ) 10 MG tablet Take 1 tablet (10 mg total) by mouth every 6 (six) hours as needed for nausea or vomiting. 30 tablet 3   rosuvastatin  (CRESTOR ) 40 MG tablet TAKE 1 TABLET BY MOUTH EVERY DAY 90 tablet 1   No current facility-administered medications for this visit.   Facility-Administered Medications Ordered in Other Visits  Medication Dose Route Frequency Provider Last Rate Last Admin   0.9 %  sodium chloride  infusion   Intravenous Continuous Timmy Forbes, MD   Stopped at 06/02/24 1151   heparin  lock flush 100 unit/mL  500 Units Intracatheter Once PRN Timmy Forbes, MD        Review of Systems  Constitutional:  Positive for fatigue. Negative for appetite change, chills and fever.  HENT:   Negative for hearing loss and voice change.   Eyes:  Negative for eye problems.  Respiratory:  Negative for chest tightness and cough.   Cardiovascular:  Negative for chest pain.  Gastrointestinal:  Negative for abdominal distention, abdominal pain and blood in stool.  Endocrine: Negative for hot flashes.  Genitourinary:  Negative for difficulty urinating and frequency.   Musculoskeletal:  Negative for arthralgias.  Skin:  Negative for itching and rash.  Neurological:  Negative for extremity weakness.  Hematological:  Negative for adenopathy.  Psychiatric/Behavioral:  Negative for confusion.      PHYSICAL EXAMINATION: ECOG PERFORMANCE STATUS: 0 - Asymptomatic  Vitals:   06/02/24 0836  BP: 116/76  Pulse: 68  Resp: 16  Temp: 97.7 F (36.5 C)  SpO2: 98%   Filed Weights   06/02/24 0836  Weight: 151 lb (68.5 kg)    Physical Exam Constitutional:      General: She is not in acute distress.    Appearance: She is not diaphoretic.  HENT:     Head: Normocephalic and atraumatic.     Nose: Nose normal.     Mouth/Throat:     Pharynx: No oropharyngeal exudate.   Eyes:     General: No scleral icterus.    Pupils: Pupils are equal, round, and reactive  to light.    Cardiovascular:     Rate and Rhythm: Normal rate and regular rhythm.     Heart sounds: No murmur heard. Pulmonary:     Effort: Pulmonary effort is normal. No respiratory distress.     Breath sounds: Normal breath sounds. No wheezing.  Abdominal:     General: There is no distension.     Palpations: Abdomen is soft.     Tenderness: There is no abdominal tenderness.   Musculoskeletal:        General: Normal range of motion.     Cervical back: Normal range of motion and neck supple.   Skin:    General: Skin is warm and dry.     Findings: No erythema.  Neurological:     Mental Status: She is alert and oriented to person, place, and time. Mental status is at baseline.     Motor: No abnormal muscle tone.   Psychiatric:        Mood and Affect: Mood and affect normal.     LABORATORY DATA:  I have reviewed the data as listed    Latest Ref Rng & Units 06/02/2024    8:22 AM 05/12/2024    8:25 AM 04/28/2024    8:32 AM  CBC  WBC 4.0 - 10.5 K/uL 13.1  18.1  20.4   Hemoglobin 12.0 - 15.0 g/dL 91.4  78.2  95.6   Hematocrit 36.0 - 46.0 % 36.8  34.7  44.4   Platelets 150 - 400 K/uL 232  248  169       Latest Ref Rng & Units 06/02/2024    8:22 AM 05/12/2024    8:25 AM 04/28/2024    8:32 AM  CMP  Glucose 70 - 99 mg/dL 213  086  578   BUN 6 - 20 mg/dL 22  19  20    Creatinine 0.44 - 1.00 mg/dL 4.69  6.29  5.28   Sodium 135 - 145 mmol/L 136  134  135   Potassium 3.5 - 5.1 mmol/L 3.7  3.5  3.5   Chloride 98 - 111 mmol/L 104  105  100   CO2 22 - 32 mmol/L 21  19  21    Calcium  8.9 - 10.3 mg/dL 9.5  8.9  9.4   Total Protein 6.5 - 8.1 g/dL 7.5  6.8  7.2   Total Bilirubin 0.0 - 1.2 mg/dL 1.3  1.0  1.1   Alkaline Phos 38 - 126 U/L 69  63  92   AST 15 - 41 U/L 19  15  26    ALT 0 - 44 U/L 28  30  18       RADIOGRAPHIC STUDIES: I have personally reviewed the radiological images as listed and agreed with the findings in the report. No results found.

## 2024-06-02 NOTE — Progress Notes (Signed)
 Nutrition Follow-up:  Patient with left breast cancer.  Receiving docetaxel  and cytoxan  due to oncotype score.    Met with patient and husband during infusion.  Reports no nausea or diarrhea.  Taste was off for first week following treatment then improved.  Patient has been eating better and exercising (walking).     Medications: reviewed  Labs: glucose 183, BUN 22, creatinine 0.50  Anthropometrics:   Weight 151 lb   155 lb on 5/28 159 lb on 5/7 165 lb on 4/2 168 lb on 2/20   NUTRITION DIAGNOSIS: Food and nutrition related knowledge deficit improved   INTERVENTION:  Continue well balanced diet including lean protein Continue exercise as able    MONITORING, EVALUATION, GOAL: weight trends, intake   NEXT VISIT: Wednesday, July 9 during infusion  Seraya Jobst B. Zollie Hipp, CSO, LDN Registered Dietitian [phone]

## 2024-06-02 NOTE — Assessment & Plan Note (Signed)
 Recommend diabetic diet and monitor blood glucose.  higher glucose level due to steroid use. Dexamethasone  dosage to 4mg  daily for 2 days after chemotherapy.  Follow up with PCP for management. On Metformin  and Farxiga

## 2024-06-04 ENCOUNTER — Inpatient Hospital Stay

## 2024-06-04 DIAGNOSIS — Z5111 Encounter for antineoplastic chemotherapy: Secondary | ICD-10-CM | POA: Diagnosis not present

## 2024-06-04 DIAGNOSIS — C50912 Malignant neoplasm of unspecified site of left female breast: Secondary | ICD-10-CM

## 2024-06-04 MED ORDER — PEGFILGRASTIM-JMDB 6 MG/0.6ML ~~LOC~~ SOSY
6.0000 mg | PREFILLED_SYRINGE | Freq: Once | SUBCUTANEOUS | Status: AC
  Administered 2024-06-04: 6 mg via SUBCUTANEOUS
  Filled 2024-06-04: qty 0.6

## 2024-06-12 ENCOUNTER — Other Ambulatory Visit: Payer: Self-pay | Admitting: Family

## 2024-06-23 ENCOUNTER — Encounter: Payer: Self-pay | Admitting: Oncology

## 2024-06-23 ENCOUNTER — Inpatient Hospital Stay

## 2024-06-23 ENCOUNTER — Inpatient Hospital Stay (HOSPITAL_BASED_OUTPATIENT_CLINIC_OR_DEPARTMENT_OTHER): Admitting: Oncology

## 2024-06-23 ENCOUNTER — Inpatient Hospital Stay: Attending: Oncology

## 2024-06-23 VITALS — BP 124/83 | HR 70 | Temp 96.6°F | Resp 18 | Ht 64.0 in | Wt 151.0 lb

## 2024-06-23 VITALS — BP 100/69 | HR 65 | Temp 97.7°F | Resp 18

## 2024-06-23 DIAGNOSIS — C50912 Malignant neoplasm of unspecified site of left female breast: Secondary | ICD-10-CM | POA: Diagnosis not present

## 2024-06-23 DIAGNOSIS — E1165 Type 2 diabetes mellitus with hyperglycemia: Secondary | ICD-10-CM

## 2024-06-23 DIAGNOSIS — Z79633 Long term (current) use of mitotic inhibitor: Secondary | ICD-10-CM | POA: Diagnosis not present

## 2024-06-23 DIAGNOSIS — C50812 Malignant neoplasm of overlapping sites of left female breast: Secondary | ICD-10-CM | POA: Insufficient documentation

## 2024-06-23 DIAGNOSIS — Z170 Estrogen receptor positive status [ER+]: Secondary | ICD-10-CM | POA: Insufficient documentation

## 2024-06-23 DIAGNOSIS — Z5111 Encounter for antineoplastic chemotherapy: Secondary | ICD-10-CM | POA: Diagnosis not present

## 2024-06-23 DIAGNOSIS — Z79630 Long term (current) use of alkylating agent: Secondary | ICD-10-CM | POA: Insufficient documentation

## 2024-06-23 LAB — CMP (CANCER CENTER ONLY)
ALT: 21 U/L (ref 0–44)
AST: 22 U/L (ref 15–41)
Albumin: 4.4 g/dL (ref 3.5–5.0)
Alkaline Phosphatase: 66 U/L (ref 38–126)
Anion gap: 11 (ref 5–15)
BUN: 20 mg/dL (ref 6–20)
CO2: 20 mmol/L — ABNORMAL LOW (ref 22–32)
Calcium: 9.1 mg/dL (ref 8.9–10.3)
Chloride: 104 mmol/L (ref 98–111)
Creatinine: 0.55 mg/dL (ref 0.44–1.00)
GFR, Estimated: >60 mL/min (ref >60)
Glucose, Bld: 186 mg/dL — ABNORMAL HIGH (ref 70–99)
Potassium: 3.7 mmol/L (ref 3.5–5.1)
Sodium: 135 mmol/L (ref 135–145)
Total Bilirubin: 0.8 mg/dL (ref 0.0–1.2)
Total Protein: 7.2 g/dL (ref 6.5–8.1)

## 2024-06-23 LAB — CBC WITH DIFFERENTIAL (CANCER CENTER ONLY)
Abs Immature Granulocytes: 0.4 K/uL — ABNORMAL HIGH (ref 0.00–0.07)
Basophils Absolute: 0.1 K/uL (ref 0.0–0.1)
Basophils Relative: 1 %
Eosinophils Absolute: 0 K/uL (ref 0.0–0.5)
Eosinophils Relative: 0 %
HCT: 35.6 % — ABNORMAL LOW (ref 36.0–46.0)
Hemoglobin: 11.7 g/dL — ABNORMAL LOW (ref 12.0–15.0)
Immature Granulocytes: 3 %
Lymphocytes Relative: 7 %
Lymphs Abs: 1 K/uL (ref 0.7–4.0)
MCH: 28.2 pg (ref 26.0–34.0)
MCHC: 32.9 g/dL (ref 30.0–36.0)
MCV: 85.8 fL (ref 80.0–100.0)
Monocytes Absolute: 0.7 K/uL (ref 0.1–1.0)
Monocytes Relative: 5 %
Neutro Abs: 12.2 K/uL — ABNORMAL HIGH (ref 1.7–7.7)
Neutrophils Relative %: 84 %
Platelet Count: 208 K/uL (ref 150–400)
RBC: 4.15 MIL/uL (ref 3.87–5.11)
RDW: 18.7 % — ABNORMAL HIGH (ref 11.5–15.5)
WBC Count: 14.4 K/uL — ABNORMAL HIGH (ref 4.0–10.5)
nRBC: 0 % (ref 0.0–0.2)

## 2024-06-23 MED ORDER — SODIUM CHLORIDE 0.9 % IV SOLN
600.0000 mg/m2 | Freq: Once | INTRAVENOUS | Status: AC
  Administered 2024-06-23: 1000 mg via INTRAVENOUS
  Filled 2024-06-23: qty 50

## 2024-06-23 MED ORDER — DEXAMETHASONE SODIUM PHOSPHATE 10 MG/ML IJ SOLN
10.0000 mg | Freq: Once | INTRAMUSCULAR | Status: AC
  Administered 2024-06-23: 10 mg via INTRAVENOUS
  Filled 2024-06-23: qty 1

## 2024-06-23 MED ORDER — SODIUM CHLORIDE 0.9 % IV SOLN
75.0000 mg/m2 | Freq: Once | INTRAVENOUS | Status: AC
  Administered 2024-06-23: 137 mg via INTRAVENOUS
  Filled 2024-06-23: qty 13.7

## 2024-06-23 MED ORDER — SODIUM CHLORIDE 0.9 % IV SOLN
INTRAVENOUS | Status: DC
  Filled 2024-06-23: qty 250

## 2024-06-23 MED ORDER — PALONOSETRON HCL INJECTION 0.25 MG/5ML
0.2500 mg | Freq: Once | INTRAVENOUS | Status: AC
  Administered 2024-06-23: 0.25 mg via INTRAVENOUS
  Filled 2024-06-23: qty 5

## 2024-06-23 NOTE — Progress Notes (Signed)
 Hematology/Oncology Progress note [phone] [phone]      CHIEF COMPLAINTS/PURPOSE OF CONSULTATION:  Left breast cancer  ASSESSMENT & PLAN:   Cancer Staging  Invasive ductal carcinoma of breast, female, left (HCC) Staging form: Breast, AJCC 8th Edition - Clinical stage from 03/08/2024: Stage IA (cT1b, cN0, cM0, G2, ER+, PR+, HER2-) - Signed by Babara Call, MD on 03/08/2024 - Pathologic stage from 04/07/2024: Stage IA (pT1c, pN0, cM0, G3, ER+, PR+, HER2-, Oncotype DX score: 28) - Signed by Babara Call, MD on 04/07/2024   Invasive ductal carcinoma of breast, female, left (HCC) Left breast invasive ductal carcinoma, pT1b cN0 ER 99%+/PR 30%+, HER2 low (1+), Oncotype DX RS 28, >15% chemotherapy benefit.  Recommend adjuvant chemotherapy.  Given her history of cardiovascular disease including CAD, NSTEMI, I recommend non anthracycline regimen with TC Q3 weeks x 4 cycles.  Labs are reviewed and discussed with patient. Proceed with  cycle 4 TC with D3 GCSF.  Refer to Radonc for adjuvant radiation.  After she finishes radiation, will start adjuvant endocrine therapy.    Encounter for antineoplastic chemotherapy Treatment plan as listed above.   Type 2 diabetes mellitus with hyperglycemia, without long-term current use of insulin (HCC) Recommend diabetic diet and monitor blood glucose.  higher glucose level due to steroid use. Dexamethasone  dosage to 4mg  daily for 2 days after chemotherapy.  Follow up with PCP for management. On Metformin  and Farxiga      Orders Placed This Encounter  Procedures   Ambulatory referral to Radiation Oncology    Referral Priority:   Routine    Referral Type:   Consultation    Referral Reason:   Specialty Services Required    Requested Specialty:   Radiation Oncology    Number of Visits Requested:   1   Follow up  LOS All questions were answered. The patient knows to call the clinic with any problems, questions or concerns. We spent  sufficient time to discuss many aspect of care, questions were answered to patient's satisfaction.   Call Babara, MD, PhD [facility] Hematology Oncology 06/23/2024    HISTORY OF PRESENTING ILLNESS:  Zoe [name] 58 y.o. female presents to establish care for left breast cancer I have reviewed her chart and materials related to her cancer extensively and collaborated history with the patient. Summary of oncologic history is as follows: Oncology History  Invasive ductal carcinoma of breast, female, left (HCC)  02/17/2024 Mammogram   Bilateral screening mammogram showed  FINDINGS: In the left breast, a possible mass warrants further evaluation. In the right breast, no findings suspicious for malignancy.   IMPRESSION: Further evaluation is suggested for a possible mass in the left breast.    02/24/2024 Mammogram   Unilateral left breast diagnostic mammogram  1. There is an 8 mm mass at the site of screening mammographic concern which is highly suspicious for malignancy. Recommend ultrasound-guided biopsy for definitive characterization. 2. No suspicious LEFT axillary adenopathy.   03/08/2024 Initial Diagnosis   Invasive ductal carcinoma of breast, female, left   Patient underwent left breast mass biopsy Pathology showed  1. Breast, left, needle core biopsy, 8 o'clock, 8cmfn :       - INVASIVE DUCTAL CARCINOMA, GRADE 2, EXTENDING TO 0.6 CM IN MAXIMUM EXTENT, AND       INVOLVING TWO OF TWO BIOPSY FRAGMENTS.  SEE COMMENT.       - TUBULE FORMATION: SCORE 3       - NUCLEAR PLEOMORPHISM: SCORE 2       -  MITOTIC COUNT: SCORE 1       - TOTAL SCORE: 6       - OVERALL GRADE:  2       - LYMPHOVASCULAR INVASION: NOT IDENTIFIED       - CANCER LENGTH: 6 MM       - CALCIFICATIONS: NOT IDENTIFIED       - LYMPHOVASCULAR INVASION: NOT IDENTIFIED.   ER + 99% PR + 30%, Her2  negative (1+)    Menarche at age of 53 First live birth at age of 89 OCP use: >5 years, then switched to IUD History  of hysterectomy: no Menopausal status: unsure History of HRT use: No History of chest radiation: no  Number of previous breast biopsies:  no  Denies family history of cancer, except that her father's cousin has breast cancer.    03/08/2024 Cancer Staging   Staging form: Breast, AJCC 8th Edition - Clinical stage from 03/08/2024: Stage IA (cT1b, cN0, cM0, G2, ER+, PR+, HER2-) - Signed by Babara Call, MD on 03/08/2024 Stage prefix: Initial diagnosis Histologic grading system: 3 grade system   03/17/2024 Surgery   Patient underwent left breast lumpectomy and SLNB  1. Breast, lumpectomy, left breast mass :       - INVASIVE MAMMARY CARCINOMA OF NO SPECIAL TYPE (DUCTAL).       - DUCTAL CARCINOMA IN SITU (DCIS).       - SEE CANCER SUMMARY BELOW.       - PRIOR BIOPSY SITE CHANGE WITH CLIP.       - SCOUT TAG IS PRESENT.        2. Lymph node, sentinel, biopsy, left axillary sentinel lymph node #1 :       - ONE LYMPH NODE NEGATIVE FOR MALIGNANCY (0/1).        3. Lymph node, sentinel, biopsy, left axillary sentinel lymph node #2 :       - ONE LYMPH NODE NEGATIVE FOR MALIGNANCY (0/1).    TUMOR Histologic Type: Invasive carcinoma of no special type (ductal) Histologic Grade (Nottingham Histologic Score) Glandular (Acinar)/Tubular Differentiation: 3 Nuclear Pleomorphism: 2 Mitotic Rate: 3 Overall Grade: 3 Tumor Size: Greatest dimension of largest invasive focus: 12 mm Ductal Carcinoma In Situ (DCIS): Present, intermediate to high-grade Tumor Extent: Not applicable Lymphatic and/or Vascular Invasion: Not identified Treatment Effect in the Breast: No known presurgical therapy  MARGINS Margin Status for Invasive Carcinoma: All margins negative for invasive carcinoma Distance from closest margin: 10 mm Specify closest margin: Anterior Margin Status for DCIS: All margins negative for DCIS Distance from DCIS to closest margin: 0.1 mm Specify closest margin: Anterior                       REGIONAL LYMPH NODES Regional Lymph Node Status: All regional lymph nodes negative for tumor Total Number of Lymph Nodes Examined (sentinel and non-sentinel): 2 Number of Sentinel Nodes Examined: 2  PATHOLOGIC STAGE CLASSIFICATION (pTNM, AJCC 8th Edition):  Modified Classification: Not applicable  pT Category: pT1c  T Suffix: Not applicable  pN Category: pN0  N Suffix: (sn)  pM Category: Not applicable     04/07/2024 Cancer Staging   Staging form: Breast, AJCC 8th Edition - Pathologic stage from 04/07/2024: Stage IA (pT1c, pN0, cM0, G3, ER+, PR+, HER2-, Oncotype DX score: 28) - Signed by Babara Call, MD on 04/07/2024 Stage prefix: Initial diagnosis Multigene prognostic tests performed: Oncotype DX Recurrence score range: Greater than or equal to 11 Histologic grading  system: 3 grade system   04/21/2024 -  Chemotherapy   Patient is on Treatment Plan : BREAST TC q21d      Genetic Testing   Negative genetic testing. No pathogenic variants identified on the [employer]. The report date is 05/24/2024.  The [employer] [facility] Panel includes sequencing, rearrangement analysis, and RNA analysis for the following 40 genes: APC, ATM, BAP1, BARD1, BMPR1A, BRCA1, BRCA2, BRIP1, CDH1, CDKN2A, CHEK2, FH, FLCN, MET, MLH1, MSH2, MSH6, MUTYH, NF1, NTHL1, PALB2, PMS2, PTEN, RAD51C, RAD51D, RPS20, SMAD4, STK11, TP53, TSC1, TSC2, and VHL (sequencing and deletion/duplication); AXIN2, HOXB13, MBD4, MSH3, POLD1 and POLE (sequencing only); EPCAM and GREM1 (deletion/duplication only).    She tolerated TC with GCSF support.  No nausea vomiting diarrhea. No fever or chills. No numbness tingling sensation. No other new complaints.    MEDICAL HISTORY:  Past Medical History:  Diagnosis Date   Allergic rhinitis    Asthma    Complication of anesthesia    a.) delayed/prolonged emergence   Coronary artery disease    Dysplastic nevi 08/30/2020   Right lateral distal deltoid, left lower back post  waistline. 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   T2DM (type 2 diabetes mellitus) (HCC)    Unstable angina (HCC)     SURGICAL HISTORY: Past Surgical History:  Procedure Laterality Date   AXILLARY SENTINEL NODE BIOPSY Left 03/17/2024   Procedure: BIOPSY, LYMPH NODE, SENTINEL, AXILLARY;  Surgeon: Rodolph Romano, MD;  Location: [facility];  Service: General;  Laterality: Left;   BREAST BIOPSY Left 03/03/2024   US  LT BREAST Bx venus marker,   8:00 8 cmfn : - INVASIVE DUCTAL CARCINOMA   BREAST BIOPSY Left 03/11/2024   US  LT RADIO FREQUENCY TAG LOC US  GUIDE 03/11/2024 [facility]   BREAST LUMPECTOMY Left 03/17/2024   Procedure: BREAST LUMPECTOMY;  Surgeon: Rodolph Romano, MD;  Location: [facility];  Service: General;  Laterality: Left;  w/ RF tag   CARDIAC CATHETERIZATION     COLONOSCOPY  2001   COLONOSCOPY WITH PROPOFOL  N/A 11/05/2019   Procedure: COLONOSCOPY WITH PROPOFOL ;  Surgeon: Janalyn Keene NOVAK, MD;  Location: [facility];  Service: Endoscopy;  Laterality: N/A;   CORONARY ANGIOPLASTY     DILATATION & CURETTAGE/HYSTEROSCOPY WITH MYOSURE N/A 02/29/2016   Procedure: DILATATION & CURETTAGE/HYSTEROSCOPY WITH MYOSURE;  Surgeon: Mitzie BROCKS Ward, MD;  Location: [facility];  Service: Gynecology;  Laterality: N/A;   DILATION AND CURETTAGE OF UTERUS     EAR CYST EXCISION Left 2015   IN OFFICE    ESOPHAGOGASTRODUODENOSCOPY     INTRAUTERINE DEVICE (IUD) INSERTION N/A 02/29/2016   Procedure: INTRAUTERINE DEVICE (IUD) INSERTION;  Surgeon: Mitzie BROCKS Ward, MD;  Location: [facility];  Service: Gynecology;  Laterality: N/A;   LEFT HEART CATH AND CORONARY ANGIOGRAPHY Left 03/16/2019   Procedure: LEFT HEART CATH AND CORONARY ANGIOGRAPHY;  Surgeon: Fernand Denyse LABOR, MD;  Location: [facility];  Service: Cardiovascular;  Laterality: Left;    SOCIAL HISTORY: Social History   Socioeconomic History   Marital status: Married    Spouse name: Sullivant,Bobby T (Spouse)   Number of children: 2   Years of education: 18   Highest education level: Not on file  Occupational History   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  Other Topics Concern   Not on file  Social History Narrative   Not on file   Social Drivers of Health   Financial Resource Strain: Low Risk  (03/09/2024)   Received from [facility]   Overall Financial Resource Strain (CARDIA)    Difficulty of Paying Living Expenses: Not hard at all  Food Insecurity: No Food Insecurity (03/09/2024)   [facility] from [facility]   Hunger Vital Sign    Within the past 12 months, you worried that your food would run out before you got the money to buy more.: Never true    Within the past 12 months, the food you bought just didn't last and you didn't have money to get more.: Never true  Transportation Needs: No Transportation Needs (03/09/2024)   Received from [facility] - Transportation    In the past  12 months, has lack of transportation kept you from medical appointments or from getting medications?: No    Lack of Transportation (Non-Medical): No  Physical Activity: Not on file  Stress: Not on file  Social Connections: Not on file  Intimate Partner Violence: Not At Risk (03/08/2024)   Humiliation, Afraid, Rape, and Kick questionnaire    Fear of Current or Ex-Partner: No    Emotionally Abused: No    Physically Abused: No    Sexually Abused: No    FAMILY HISTORY: Family History  Problem Relation Age of Onset   Hyperlipidemia Mother    Hypertension Mother    Heart disease Father        died from either MI or stroke   Diabetes Brother        TYPE 2   Heart disease Paternal Grandfather    Diabetes Other        TYPE 2   Cancer Neg Hx    Breast cancer Neg Hx     ALLERGIES:  is allergic to ace inhibitors and neosporin wound cleanser [benzalkonium chloride].  MEDICATIONS:  Current Outpatient Medications  Medication Sig Dispense Refill   aspirin  81 MG tablet Take 81 mg by mouth daily.     Cholecalciferol (VITAMIN D-3 PO) Take 2,000 Units by mouth daily.     Coenzyme Q10 (CO Q 10 PO) Take 1 capsule by mouth daily.     dexamethasone  (DECADRON ) 4 MG tablet Take 2 tabs by mouth 2 times daily starting day before chemo. Then take 2 tabs daily for 2 days starting day after chemo. Take with food. 30 tablet 1   FARXIGA 10 MG TABS tablet TAKE 1 TABLET BY MOUTH EVERY DAY IN  THE MORNING 30 tablet 11   ibuprofen  (ADVIL ,MOTRIN ) 600 MG tablet Take 1 tablet (600 mg total) by mouth every 6 (six) hours as needed. 30 tablet 0   isosorbide  mononitrate (IMDUR ) 30 MG 24 hr tablet TAKE 1 TABLET BY MOUTH EVERY DAY 90 tablet 2   losartan-hydrochlorothiazide  (HYZAAR) 50-12.5 MG tablet TAKE 1 TABLET BY MOUTH EVERY DAY 90 tablet 0   metFORMIN  (GLUCOPHAGE ) 500 MG tablet TAKE 1 TABLET BY MOUTH 2 TIMES DAILY WITH A MEAL. 180 tablet 1   metoprolol  succinate (TOPROL -XL) 50 MG 24 hr tablet TAKE 1 TABLET BY MOUTH  EVERY DAY 90 tablet 2   Omega-3 Fatty Acids (FISH OIL) 1200 MG CAPS Take 1,200 mg by mouth daily.     ondansetron  (ZOFRAN ) 8 MG tablet Take 1 tablet (8 mg total) by mouth every 8 (eight) hours as needed for nausea or vomiting. Start on the third day after chemotherapy. 30 tablet 3   pantoprazole  (PROTONIX ) 40 MG tablet TAKE 1 TABLET BY MOUTH EVERY DAY 90 tablet 1   prochlorperazine  (COMPAZINE ) 10 MG tablet Take 1 tablet (10 mg total) by mouth every 6 (six) hours as needed for nausea or vomiting. 30 tablet 3   rosuvastatin  (CRESTOR ) 40 MG tablet TAKE 1 TABLET BY MOUTH EVERY DAY 90 tablet 1   No current facility-administered medications for this visit.   Facility-Administered Medications Ordered in Other Visits  Medication Dose Route Frequency Provider Last Rate Last Admin   0.9 %  sodium chloride  infusion   Intravenous Continuous Babara Call, MD   Stopped at 06/23/24 1536    Review of Systems  Constitutional:  Positive for fatigue. Negative for appetite change, chills and fever.  HENT:   Negative for hearing loss and voice change.   Eyes:  Negative for eye problems.  Respiratory:  Negative for chest tightness and cough.   Cardiovascular:  Negative for chest pain.  Gastrointestinal:  Negative for abdominal distention, abdominal pain and blood in stool.  Endocrine: Negative for hot flashes.  Genitourinary:  Negative for difficulty urinating and frequency.   Musculoskeletal:  Negative for arthralgias.  Skin:  Negative for itching and rash.  Neurological:  Negative for extremity weakness.  Hematological:  Negative for adenopathy.  Psychiatric/Behavioral:  Negative for confusion.      PHYSICAL EXAMINATION: ECOG PERFORMANCE STATUS: 0 - Asymptomatic  Vitals:   06/23/24 1118  BP: 124/83  Pulse: 70  Resp: 18  Temp: (!) 96.6 F (35.9 C)  SpO2: 96%   Filed Weights   06/23/24 1118  Weight: 151 lb (68.5 kg)    Physical Exam Constitutional:      General: She is not in acute  distress.    Appearance: She is not diaphoretic.  HENT:     Head: Normocephalic and atraumatic.     Nose: Nose normal.     Mouth/Throat:     Pharynx: No oropharyngeal exudate.  Eyes:     General: No scleral icterus.    Pupils: Pupils are equal, round, and reactive to light.  Cardiovascular:     Rate and Rhythm: Normal rate and regular rhythm.     Heart sounds: No murmur heard. Pulmonary:     Effort: Pulmonary effort is normal. No respiratory distress.     Breath sounds: Normal breath sounds. No wheezing.  Abdominal:     General: There is no distension.     Palpations: Abdomen is soft.     Tenderness: There is no abdominal tenderness.  Musculoskeletal:  General: Normal range of motion.     Cervical back: Normal range of motion and neck supple.  Skin:    General: Skin is warm and dry.     Findings: No erythema.  Neurological:     Mental Status: She is alert and oriented to person, place, and time. Mental status is at baseline.     Motor: No abnormal muscle tone.  Psychiatric:        Mood and Affect: Mood and affect normal.     LABORATORY DATA:  I have reviewed the data as listed    Latest Ref Rng & Units 06/23/2024   10:41 AM 06/02/2024    8:22 AM 05/12/2024    8:25 AM  CBC  WBC 4.0 - 10.5 K/uL 14.4  13.1  18.1   Hemoglobin 12.0 - 15.0 g/dL 88.2  87.8  88.1   Hematocrit 36.0 - 46.0 % 35.6  36.8  34.7   Platelets 150 - 400 K/uL 208  232  248       Latest Ref Rng & Units 06/23/2024   10:41 AM 06/02/2024    8:22 AM 05/12/2024    8:25 AM  CMP  Glucose 70 - 99 mg/dL 813  816  785   BUN 6 - 20 mg/dL 20  22  19    Creatinine 0.44 - 1.00 mg/dL 9.44  9.49  9.53   Sodium 135 - 145 mmol/L 135  136  134   Potassium 3.5 - 5.1 mmol/L 3.7  3.7  3.5   Chloride 98 - 111 mmol/L 104  104  105   CO2 22 - 32 mmol/L 20  21  19    Calcium  8.9 - 10.3 mg/dL 9.1  9.5  8.9   Total Protein 6.5 - 8.1 g/dL 7.2  7.5  6.8   Total Bilirubin 0.0 - 1.2 mg/dL 0.8  1.3  1.0   Alkaline Phos 38 -  126 U/L 66  69  63   AST 15 - 41 U/L 22  19  15    ALT 0 - 44 U/L 21  28  30       RADIOGRAPHIC STUDIES: I have personally reviewed the radiological images as listed and agreed with the findings in the report. No results found.

## 2024-06-23 NOTE — Assessment & Plan Note (Addendum)
 Left breast invasive ductal carcinoma, pT1b cN0 ER 99%+/PR 30%+, HER2 low (1+), Oncotype DX RS 28, >15% chemotherapy benefit.  Recommend adjuvant chemotherapy.  Given her history of cardiovascular disease including CAD, NSTEMI, I recommend non anthracycline regimen with TC Q3 weeks x 4 cycles.  Labs are reviewed and discussed with patient. Proceed with  cycle 4 TC with D3 GCSF.  Refer to Radonc for adjuvant radiation.  After she finishes radiation, will start adjuvant endocrine therapy.

## 2024-06-23 NOTE — Progress Notes (Signed)
 Patient says that the only side effects that she has experienced is having the hair loss. Other than that she is feeling great.

## 2024-06-23 NOTE — Assessment & Plan Note (Signed)
 Recommend diabetic diet and monitor blood glucose.  higher glucose level due to steroid use. Dexamethasone  dosage to 4mg  daily for 2 days after chemotherapy.  Follow up with PCP for management. On Metformin  and Farxiga

## 2024-06-23 NOTE — Assessment & Plan Note (Signed)
 Treatment plan as listed above.

## 2024-06-23 NOTE — Progress Notes (Signed)
 Nutrition Follow-up:  Patient with left breast cancer.  Receiving last dose of docetaxel  and cytoxan  today.    Met with patient and husband during infusion.  Patient reports only symptom is fatigue.  Appetite is normal.      Medications: reviewed  Labs: reviewed  Anthropometrics:   Weight 151 lb today  151 lb on 6/18 155 lb on 5/28 159 lb on 5/7 165 lb on 4/2 168 lb on 2/20   NUTRITION DIAGNOSIS: Food and nutrition related knowledge deficit improved    INTERVENTION:  Discussed AICR recommendations regarding survivorship nutrition.  Provided Breast Cancer Survivorship Nutrition handout Patient has contact information     MONITORING, EVALUATION, GOAL: weight trends, intake   NEXT VISIT: no follow-up RD available as needed  Airon Sahni B. Dasie SOLON, CSO, LDN Registered Dietitian [phone]

## 2024-06-24 ENCOUNTER — Encounter: Payer: Self-pay | Admitting: *Deleted

## 2024-06-24 NOTE — Progress Notes (Signed)
 Discussed doing SOZO screen either before or after radiation.   [name] prefers to wait until after radiation.  Will touch base with her after radiation appointments are scheduled to set up a SOZO screening date/time.

## 2024-06-25 ENCOUNTER — Inpatient Hospital Stay

## 2024-06-25 DIAGNOSIS — C50912 Malignant neoplasm of unspecified site of left female breast: Secondary | ICD-10-CM

## 2024-06-25 DIAGNOSIS — Z5111 Encounter for antineoplastic chemotherapy: Secondary | ICD-10-CM | POA: Diagnosis not present

## 2024-06-25 MED ORDER — PEGFILGRASTIM-JMDB 6 MG/0.6ML ~~LOC~~ SOSY
6.0000 mg | PREFILLED_SYRINGE | Freq: Once | SUBCUTANEOUS | Status: AC
  Administered 2024-06-25: 6 mg via SUBCUTANEOUS
  Filled 2024-06-25: qty 0.6

## 2024-06-28 ENCOUNTER — Institutional Professional Consult (permissible substitution): Admitting: Radiation Oncology

## 2024-06-29 ENCOUNTER — Ambulatory Visit: Admitting: Cardiology

## 2024-07-06 ENCOUNTER — Ambulatory Visit
Admission: RE | Admit: 2024-07-06 | Discharge: 2024-07-06 | Disposition: A | Discharge status: Home / Self Care | Source: Ambulatory Visit | Attending: Radiation Oncology | Admitting: Radiation Oncology

## 2024-07-06 ENCOUNTER — Other Ambulatory Visit

## 2024-07-06 ENCOUNTER — Encounter: Payer: Self-pay | Admitting: Radiation Oncology

## 2024-07-06 VITALS — BP 138/87 | HR 66 | Temp 97.3°F | Resp 16 | Wt 151.0 lb

## 2024-07-06 DIAGNOSIS — E782 Mixed hyperlipidemia: Secondary | ICD-10-CM

## 2024-07-06 DIAGNOSIS — E1165 Type 2 diabetes mellitus with hyperglycemia: Secondary | ICD-10-CM

## 2024-07-06 DIAGNOSIS — E7800 Pure hypercholesterolemia, unspecified: Secondary | ICD-10-CM

## 2024-07-06 DIAGNOSIS — I10 Essential (primary) hypertension: Secondary | ICD-10-CM

## 2024-07-06 DIAGNOSIS — C50912 Malignant neoplasm of unspecified site of left female breast: Secondary | ICD-10-CM | POA: Insufficient documentation

## 2024-07-06 NOTE — Progress Notes (Signed)
 Radiation Oncology Follow up Note  Name: Zoe [name]   Date:   07/06/2024 MRN:  [id number] DOB: 1966-10-27    This 58 y.o. female presents to the clinic today for reevaluation of patient with stage Ia (T1 cN0 M0) ER/PR positive invasive mammary carcinoma the left breast status post wide local excision and sentinel biopsy and adjuvant chemotherapy.  REFERRING PROVIDER: Carin Gauze, NP  HPI: Patient was originally consulted back in April 2025 for stage Ia ER positive invasive mammary carcinoma..  At that time Oncotype DX score was 28 she is undergone adjuvant chemotherapy with 4 cycles of TC which she is tolerated well except for some hair loss.  She is seen today to commence with her radiation treatment plan.  She is doing well specifically denies breast tenderness cough or bone pain.  COMPLICATIONS OF TREATMENT: none  FOLLOW UP COMPLIANCE: keeps appointments   PHYSICAL EXAM:  BP 138/87   Pulse 66   Temp (!) 97.3 F (36.3 C)   Resp 16   Wt 151 lb (68.5 kg)   BMI 25.92 kg/m  Lungs are clear to A&P cardiac examination essentially unremarkable with regular rate and rhythm. No dominant mass or nodularity is noted in either breast in 2 positions examined. Incision is well-healed. No axillary or supraclavicular adenopathy is appreciated. Cosmetic result is excellent.  Well-developed well-nourished patient in NAD. HEENT reveals PERLA, EOMI, discs not visualized.  Oral cavity is clear. No oral mucosal lesions are identified. Neck is clear without evidence of cervical or supraclavicular adenopathy. Lungs are clear to A&P. Cardiac examination is essentially unremarkable with regular rate and rhythm without murmur rub or thrill. Abdomen is benign with no organomegaly or masses noted. Motor sensory and DTR levels are equal and symmetric in the upper and lower extremities. Cranial nerves II through XII are grossly intact. Proprioception is intact. No peripheral adenopathy or edema is identified.  No motor or sensory levels are noted. Crude visual fields are within normal range.  RADIOLOGY RESULTS: No current films to review  PLAN: At the present time we will go ahead with whole breast radiation a hypofractionated regimen over 3 weeks using deep inspiration breath-hold technique.  To her left breast.  Would also boost her scar 1600 cGy using photon beam therapy for close DCIS margin of 0.1 mm.  Her margin for invasive component was 1 cm.  Risks and benefits of treatment occluding skin reaction fatigue alteration of blood counts possible inclusion of superficial lung all were reviewed in detail with the patient.  I have personally set up and ordered CT simulation.  Patient comprehends my recommendations well.  She also benefit from endocrine therapy after completion of radiation.  I would like to take this opportunity to thank you for allowing me to participate in the care of your patient.SABRA Marcey Penton, MD

## 2024-07-07 ENCOUNTER — Ambulatory Visit
Admission: RE | Admit: 2024-07-07 | Discharge: 2024-07-07 | Discharge status: Home / Self Care | Source: Ambulatory Visit | Attending: Radiation Oncology | Admitting: Radiation Oncology

## 2024-07-07 ENCOUNTER — Other Ambulatory Visit: Payer: Self-pay

## 2024-07-07 ENCOUNTER — Other Ambulatory Visit: Payer: Self-pay | Admitting: *Deleted

## 2024-07-07 ENCOUNTER — Encounter: Payer: Self-pay | Admitting: Oncology

## 2024-07-07 DIAGNOSIS — C50912 Malignant neoplasm of unspecified site of left female breast: Secondary | ICD-10-CM | POA: Diagnosis not present

## 2024-07-07 LAB — CMP14+EGFR
ALT: 15 IU/L (ref 0–32)
AST: 16 IU/L (ref 0–40)
Albumin: 4.1 g/dL (ref 3.8–4.9)
Alkaline Phosphatase: 130 IU/L — ABNORMAL HIGH (ref 44–121)
BUN/Creatinine Ratio: 22 (ref 9–23)
BUN: 11 mg/dL (ref 6–24)
Bilirubin Total: 0.5 mg/dL (ref 0.0–1.2)
CO2: 18 mmol/L — ABNORMAL LOW (ref 20–29)
Calcium: 9.1 mg/dL (ref 8.7–10.2)
Chloride: 107 mmol/L — ABNORMAL HIGH (ref 96–106)
Creatinine, Ser: 0.51 mg/dL — ABNORMAL LOW (ref 0.57–1.00)
Globulin, Total: 2 g/dL (ref 1.5–4.5)
Glucose: 118 mg/dL — ABNORMAL HIGH (ref 70–99)
Potassium: 3.9 mmol/L (ref 3.5–5.2)
Sodium: 145 mmol/L — ABNORMAL HIGH (ref 134–144)
Total Protein: 6.1 g/dL (ref 6.0–8.5)
eGFR: 110 mL/min/1.73 (ref >59)

## 2024-07-07 LAB — LIPID PANEL
Chol/HDL Ratio: 4.3 ratio (ref 0.0–4.4)
Cholesterol, Total: 90 mg/dL — ABNORMAL LOW (ref 100–199)
HDL: 21 mg/dL — ABNORMAL LOW (ref >39)
LDL Chol Calc (NIH): 37 mg/dL (ref 0–99)
Triglycerides: 198 mg/dL — ABNORMAL HIGH (ref 0–149)
VLDL Cholesterol Cal: 32 mg/dL (ref 5–40)

## 2024-07-07 LAB — HEMOGLOBIN A1C
Est. average glucose Bld gHb Est-mCnc: 143 mg/dL
Hgb A1c MFr Bld: 6.6 % — ABNORMAL HIGH (ref 4.8–5.6)

## 2024-07-07 LAB — TSH: TSH: 1.25 u[IU]/mL (ref 0.450–4.500)

## 2024-07-12 ENCOUNTER — Ambulatory Visit: Payer: Self-pay | Admitting: Cardiology

## 2024-07-12 DIAGNOSIS — C50912 Malignant neoplasm of unspecified site of left female breast: Secondary | ICD-10-CM | POA: Diagnosis not present

## 2024-07-13 ENCOUNTER — Encounter: Payer: Self-pay | Admitting: *Deleted

## 2024-07-13 ENCOUNTER — Ambulatory Visit
Admission: RE | Admit: 2024-07-13 | Discharge: 2024-07-13 | Disposition: A | Discharge status: Home / Self Care | Source: Ambulatory Visit | Attending: Radiation Oncology | Admitting: Radiation Oncology

## 2024-07-13 DIAGNOSIS — C50912 Malignant neoplasm of unspecified site of left female breast: Secondary | ICD-10-CM | POA: Diagnosis not present

## 2024-07-14 ENCOUNTER — Other Ambulatory Visit: Payer: Self-pay

## 2024-07-14 ENCOUNTER — Ambulatory Visit
Admission: RE | Admit: 2024-07-14 | Discharge: 2024-07-14 | Discharge status: Home / Self Care | Source: Ambulatory Visit | Attending: Radiation Oncology | Admitting: Radiation Oncology

## 2024-07-14 ENCOUNTER — Encounter: Payer: Self-pay | Admitting: Oncology

## 2024-07-14 ENCOUNTER — Encounter: Payer: Self-pay | Admitting: Cardiology

## 2024-07-14 ENCOUNTER — Ambulatory Visit (INDEPENDENT_AMBULATORY_CARE_PROVIDER_SITE_OTHER): Admitting: Cardiology

## 2024-07-14 VITALS — BP 122/72 | HR 78 | Ht 64.0 in | Wt 151.0 lb

## 2024-07-14 DIAGNOSIS — E782 Mixed hyperlipidemia: Secondary | ICD-10-CM | POA: Diagnosis not present

## 2024-07-14 DIAGNOSIS — E1165 Type 2 diabetes mellitus with hyperglycemia: Secondary | ICD-10-CM

## 2024-07-14 DIAGNOSIS — C50912 Malignant neoplasm of unspecified site of left female breast: Secondary | ICD-10-CM | POA: Diagnosis not present

## 2024-07-14 DIAGNOSIS — I10 Essential (primary) hypertension: Secondary | ICD-10-CM | POA: Diagnosis not present

## 2024-07-14 LAB — RAD ONC ARIA SESSION SUMMARY
Course Elapsed Days: 0
Plan Fractions Treated to Date: 1
Plan Prescribed Dose Per Fraction: 2.66 Gy
Plan Total Fractions Prescribed: 16
Plan Total Prescribed Dose: 42.56 Gy
Reference Point Dosage Given to Date: 2.66 Gy
Reference Point Session Dosage Given: 2.66 Gy
Session Number: 1

## 2024-07-14 NOTE — Progress Notes (Signed)
 Established Patient Office Visit  Subjective:  Patient ID: Zoe [name], female    DOB: [birth year]  Age: 58 y.o. MRN: [id number]  Chief Complaint  Patient presents with   Follow-up    4 month follow up, discuss lab results    Patient in office for 4 month follow up, discuss recent lab results. Patient doing well, no new complaints. Patient recently finished chemo for breast cancer, starting radiation today.  Discussed recent lab work. Hgb A1c improved. LDL improved. Triglycerides elevated again. Alkaline phosphate elevated. Possibly from chemo. Will recheck in 4 months. Normal thyroid.  Will continue same medications.     No other concerns at this time.   Past Medical History:  Diagnosis Date   Allergic rhinitis    Asthma    Complication of anesthesia    a.) delayed/prolonged emergence   Coronary artery disease    Dysplastic nevi 08/30/2020   Right lateral distal deltoid, left lower back post waistline. 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   T2DM (type 2 diabetes mellitus) (HCC)    Unstable angina (HCC)     Past Surgical History:  Procedure Laterality Date   AXILLARY SENTINEL NODE BIOPSY Left 03/17/2024   Procedure: BIOPSY, LYMPH NODE, SENTINEL, AXILLARY;  Surgeon: Rodolph Romano, MD;  Location: [facility];  Service: General;  Laterality: Left;   BREAST BIOPSY Left 03/03/2024   US  LT BREAST Bx  venus marker,   8:00 8 cmfn : - INVASIVE DUCTAL CARCINOMA   BREAST BIOPSY Left 03/11/2024   US  LT RADIO FREQUENCY TAG LOC US  GUIDE 03/11/2024 [facility]   BREAST LUMPECTOMY Left 03/17/2024   Procedure: BREAST LUMPECTOMY;  Surgeon: Rodolph Romano, MD;  Location: [facility];  Service: General;  Laterality: Left;  w/ RF tag   CARDIAC CATHETERIZATION     COLONOSCOPY  2001   COLONOSCOPY WITH PROPOFOL  N/A 11/05/2019   Procedure: COLONOSCOPY WITH PROPOFOL ;  Surgeon: Janalyn Keene NOVAK, MD;  Location: [facility];  Service: Endoscopy;  Laterality: N/A;   CORONARY ANGIOPLASTY     DILATATION & CURETTAGE/HYSTEROSCOPY WITH MYOSURE N/A 02/29/2016   Procedure: DILATATION & CURETTAGE/HYSTEROSCOPY WITH MYOSURE;  Surgeon: Mitzie BROCKS Ward, MD;  Location: [facility];  Service: Gynecology;  Laterality: N/A;   DILATION AND CURETTAGE OF UTERUS     EAR CYST EXCISION Left 2015   IN OFFICE   ESOPHAGOGASTRODUODENOSCOPY     INTRAUTERINE DEVICE (IUD) INSERTION N/A 02/29/2016   Procedure: INTRAUTERINE DEVICE (IUD) INSERTION;  Surgeon: Mitzie BROCKS Ward, MD;  Location: [facility];  Service: Gynecology;  Laterality: N/A;   LEFT HEART CATH AND CORONARY ANGIOGRAPHY Left 03/16/2019   Procedure: LEFT HEART CATH AND CORONARY ANGIOGRAPHY;  Surgeon: Fernand Denyse LABOR, MD;  Location: [facility];  Service: Cardiovascular;  Laterality: Left;    Social History   Socioeconomic History   Marital status: Married    Spouse name: Cacho,Bobby T (Spouse)  Number of children: 2   Years of education: 18   Highest education level: Not on file  Occupational History   Occupation: Teacher    Comment: [employer] - 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  Other Topics Concern   Not on file  Social History Narrative   Not on file   Social Drivers of Health   Financial Resource Strain: Low Risk  (03/09/2024)   Received from [facility]   Overall Financial Resource Strain (CARDIA)    Difficulty of Paying Living Expenses: Not hard at all  Food Insecurity: No Food Insecurity (03/09/2024)   Received from [facility]   Hunger Vital Sign    Within the past 12 months, you worried that your food would run out before you got the money to buy more.: Never true    Within the past 12 months, the food you bought just didn't last and you didn't have money to get more.: Never true  Transportation Needs: No Transportation Needs (03/09/2024)   Received from [facility] - Transportation    In the past 12 months, has lack of transportation kept you from medical appointments or from getting medications?: No    Lack of Transportation (Non-Medical): No  Physical Activity: Not on file  Stress: Not on file  Social Connections: Not on file  Intimate Partner Violence: Not At Risk (03/08/2024)   Humiliation, Afraid, Rape, and Kick questionnaire    Fear of Current or Ex-Partner: No    Emotionally Abused: No    Physically Abused: No    Sexually Abused: No    Family History  Problem Relation Age of Onset   Hyperlipidemia Mother    Hypertension Mother    Heart disease Father        died from either MI or stroke   Diabetes Brother        TYPE 2   Heart disease Paternal Grandfather    Diabetes Other        TYPE 2   Cancer Neg Hx    Breast cancer Neg Hx     Allergies  Allergen Reactions   Ace Inhibitors Cough    Patient cannot recall the specific ACE inhibitor that caused her cough.   Neosporin Wound Cleanser [Benzalkonium Chloride] Rash and Other (See Comments)    Swelling / redness    Outpatient Medications Prior to  Visit  Medication Sig   aspirin  81 MG tablet Take 81 mg by mouth daily.   Cholecalciferol (VITAMIN D-3 PO) Take 2,000 Units by mouth daily.   Coenzyme Q10 (CO Q 10 PO) Take 1 capsule by mouth daily.   FARXIGA 10 MG TABS tablet TAKE 1 TABLET BY MOUTH EVERY DAY IN THE MORNING   ibuprofen  (ADVIL ,MOTRIN ) 600 MG tablet Take 1 tablet (600 mg total) by mouth every 6 (six) hours as needed.   isosorbide  mononitrate (IMDUR ) 30 MG 24 hr tablet TAKE 1 TABLET BY MOUTH EVERY DAY   losartan-hydrochlorothiazide  (HYZAAR) 50-12.5 MG tablet TAKE 1 TABLET  BY MOUTH EVERY DAY   metFORMIN  (GLUCOPHAGE ) 500 MG tablet TAKE 1 TABLET BY MOUTH 2 TIMES DAILY WITH A MEAL.   metoprolol  succinate (TOPROL -XL) 50 MG 24 hr tablet TAKE 1 TABLET BY MOUTH EVERY DAY   Omega-3 Fatty Acids (FISH OIL) 1200 MG CAPS Take 1,200 mg by mouth daily.   pantoprazole  (PROTONIX ) 40 MG tablet TAKE 1 TABLET BY MOUTH EVERY DAY   rosuvastatin  (CRESTOR ) 40 MG tablet TAKE 1 TABLET BY MOUTH EVERY DAY   ondansetron  (ZOFRAN ) 8 MG tablet Take 1 tablet (8 mg total) by mouth every 8 (eight) hours as needed for nausea or vomiting. Start on the third day after chemotherapy. (Patient not taking: Reported on 07/14/2024)   prochlorperazine  (COMPAZINE ) 10 MG tablet Take 1 tablet (10 mg total) by mouth every 6 (six) hours as needed for nausea or vomiting. (Patient not taking: Reported on 07/14/2024)   No facility-administered medications prior to visit.    Review of Systems  Constitutional: Negative.   HENT: Negative.    Eyes: Negative.   Respiratory: Negative.  Negative for shortness of breath.   Cardiovascular: Negative.  Negative for chest pain.  Gastrointestinal: Negative.  Negative for abdominal pain, constipation and diarrhea.  Genitourinary: Negative.   Musculoskeletal:  Negative for joint pain and myalgias.  Skin: Negative.   Neurological: Negative.  Negative for dizziness and headaches.  Endo/Heme/Allergies: Negative.   All other systems reviewed  and are negative.      Objective:   BP 122/72   Pulse 78   Ht 5' 4 (1.626 m)   Wt 151 lb (68.5 kg)   SpO2 98%   BMI 25.92 kg/m   Vitals:   07/14/24 0859  BP: 122/72  Pulse: 78  Height: 5' 4 (1.626 m)  Weight: 151 lb (68.5 kg)  SpO2: 98%  BMI (Calculated): 25.91    Physical Exam Vitals and nursing note reviewed.  Constitutional:      Appearance: Normal appearance. She is normal weight.  HENT:     Head: Normocephalic and atraumatic.     Nose: Nose normal.     Mouth/Throat:     Mouth: Mucous membranes are moist.  Eyes:     Extraocular Movements: Extraocular movements intact.     Conjunctiva/sclera: Conjunctivae normal.     Pupils: Pupils are equal, round, and reactive to light.  Cardiovascular:     Rate and Rhythm: Normal rate and regular rhythm.     Pulses: Normal pulses.     Heart sounds: Normal heart sounds.  Pulmonary:     Effort: Pulmonary effort is normal.     Breath sounds: Normal breath sounds.  Abdominal:     General: Abdomen is flat. Bowel sounds are normal.     Palpations: Abdomen is soft.  Musculoskeletal:        General: Normal range of motion.     Cervical back: Normal range of motion.  Skin:    General: Skin is warm and dry.  Neurological:     General: No focal deficit present.     Mental Status: She is alert and oriented to person, place, and time.  Psychiatric:        Mood and Affect: Mood normal.        Behavior: Behavior normal.        Thought Content: Thought content normal.        Judgment: Judgment normal.      No results found for any visits on 07/14/24.  Recent Results (from the  past 2160 hours)  CBC with Differential (Cancer Center Only)     Status: Abnormal   Collection Time: 04/21/24  8:24 AM  Result Value Ref Range   WBC Count 10.2 4.0 - 10.5 K/uL   RBC 4.96 3.87 - 5.11 MIL/uL   Hemoglobin 13.5 12.0 - 15.0 g/dL   HCT 59.5 63.9 - 53.9 %   MCV 81.5 80.0 - 100.0 fL   MCH 27.2 26.0 - 34.0 pg   MCHC 33.4 30.0 - 36.0  g/dL   RDW 86.2 88.4 - 84.4 %   Platelet Count 218 150 - 400 K/uL   nRBC 0.0 0.0 - 0.2 %   Neutrophils Relative % 92 %   Neutro Abs 9.3 (H) 1.7 - 7.7 K/uL   Lymphocytes Relative 6 %   Lymphs Abs 0.6 (L) 0.7 - 4.0 K/uL   Monocytes Relative 2 %   Monocytes Absolute 0.2 0.1 - 1.0 K/uL   Eosinophils Relative 0 %   Eosinophils Absolute 0.0 0.0 - 0.5 K/uL   Basophils Relative 0 %   Basophils Absolute 0.0 0.0 - 0.1 K/uL   Immature Granulocytes 0 %   Abs Immature Granulocytes 0.04 0.00 - 0.07 K/uL    Comment: Performed at [facility], [address] [place], [place] [address]  CMP (Cancer Center only)     Status: Abnormal   Collection Time: 04/21/24  8:24 AM  Result Value Ref Range   Sodium 139 135 - 145 mmol/L   Potassium 3.8 3.5 - 5.1 mmol/L   Chloride 106 98 - 111 mmol/L   CO2 19 (L) 22 - 32 mmol/L   Glucose, Bld 240 (H) 70 - 99 mg/dL    Comment: Glucose reference range applies only to samples taken after fasting for at least 8 hours.   BUN 17 6 - 20 mg/dL   Creatinine 9.32 9.55 - 1.00 mg/dL   Calcium  9.7 8.9 - 10.3 mg/dL   Total Protein 7.3 6.5 - 8.1 g/dL   Albumin 4.3 3.5 - 5.0 g/dL   AST 19 15 - 41 U/L   ALT 27 0 - 44 U/L   Alkaline Phosphatase 60 38 - 126 U/L   Total Bilirubin 0.9 0.0 - 1.2 mg/dL   GFR, Estimated >39 >39 mL/min    Comment: (NOTE) Calculated using the CKD-EPI Creatinine Equation (2021)    Anion gap 14 5 - 15    Comment: Performed at [facility], [address] [place], [place] [address]  Estradiol      Status: None   Collection Time: 04/21/24  8:24 AM  Result Value Ref Range   Estradiol  14.9 pg/mL    Comment: (NOTE)                     Adult Female             Range                      Follicular phase     12.5 - 166.0                      Ovulation phase      85.8 - 498.0                      Luteal phase         43.8 - 211.0  Postmenopausal       <6.0 -  54.7                     Pregnancy                       1st trimester     215.0 - >4300.0 Roche ECLIA methodology Performed At: [facility] [address] [place], [place] [id number] Jennette Shorter MD Ey:[id number]   Follicle Stimulating Hormone     Status: None   Collection Time: 04/21/24  8:24 AM  Result Value Ref Range   FSH 70.9 mIU/mL    Comment: (NOTE)                     Adult Female             Range                      Follicular phase      3.5 -  12.5                      Ovulation phase       4.7 -  21.5                      Luteal phase          1.7 -   7.7                      Postmenopausal       25.8 - 134.8 Performed At: [facility] [address] [place], [place] [id number] Jennette Shorter MD Ey:[id number]   CMP (Cancer Center only)     Status: Abnormal   Collection Time: 04/28/24  8:32 AM  Result Value Ref Range   Sodium 135 135 - 145 mmol/L   Potassium 3.5 3.5 - 5.1 mmol/L   Chloride 100 98 - 111 mmol/L   CO2 21 (L) 22 - 32 mmol/L   Glucose, Bld 211 (H) 70 - 99 mg/dL    Comment: Glucose reference range applies only to samples taken after fasting for at least 8 hours.   BUN 20 6 - 20 mg/dL   Creatinine 9.29 9.55 - 1.00 mg/dL   Calcium  9.4 8.9 - 10.3 mg/dL   Total Protein 7.2 6.5 - 8.1 g/dL   Albumin 4.1 3.5 - 5.0 g/dL   AST 26 15 - 41 U/L   ALT 18 0 - 44 U/L   Alkaline Phosphatase 92 38 - 126 U/L   Total Bilirubin 1.1 0.0 - 1.2 mg/dL   GFR, Estimated >39 >39 mL/min    Comment: (NOTE) Calculated using the CKD-EPI Creatinine Equation (2021)    Anion gap 14 5 - 15    Comment: Performed at [facility], [address] [place], [place] [address]  CBC with Differential (Cancer Center Only)     Status: Abnormal   Collection Time: 04/28/24  8:32 AM  Result Value Ref Range   WBC Count 20.4 (H) 4.0 - 10.5 K/uL   RBC 5.57 (H) 3.87 - 5.11 MIL/uL   Hemoglobin 14.8 12.0 - 15.0 g/dL   HCT 55.5 63.9 - 53.9 %   MCV 79.7 (L) 80.0 - 100.0 fL   MCH 26.6 26.0 - 34.0 pg   MCHC 33.3 30.0 - 36.0  g/dL   RDW 86.7 88.4 - 84.4 %  Platelet Count 169 150 - 400 K/uL   nRBC 0.2 0.0 - 0.2 %   Neutrophils Relative % 56 %   Neutro Abs 11.4 (H) 1.7 - 7.7 K/uL   Lymphocytes Relative 11 %   Lymphs Abs 2.1 0.7 - 4.0 K/uL   Monocytes Relative 21 %   Monocytes Absolute 4.3 (H) 0.1 - 1.0 K/uL   Eosinophils Relative 0 %   Eosinophils Absolute 0.0 0.0 - 0.5 K/uL   Basophils Relative 0 %   Basophils Absolute 0.0 0.0 - 0.1 K/uL   WBC Morphology Moderate Left Shift (>5% metas and myelos)     Comment: DIFF. CONFIRMED BY SMEAR   RBC Morphology MORPHOLOGY UNREMARKABLE    Smear Review Normal platelet morphology     Comment: PLATELETS APPEAR ADEQUATE   Immature Granulocytes 12 %    Comment: Increased IG's, likely caused by Bone Marrow Colony Stimulating Factor received within 30 days. FULPHILA  ON 5.9.25    Abs Immature Granulocytes 2.51 (H) 0.00 - 0.07 K/uL    Comment: Performed at [facility], [address] [place], [place] [address]  Genetic Screening Order     Status: None   Collection Time: 05/12/24  8:25 AM  Result Value Ref Range   Genetic Screening Order Collected by Laboratory     Comment: Performed at [facility], [address] [place], [place] [address]  CMP (Cancer Center only)     Status: Abnormal   Collection Time: 05/12/24  8:25 AM  Result Value Ref Range   Sodium 134 (L) 135 - 145 mmol/L   Potassium 3.5 3.5 - 5.1 mmol/L   Chloride 105 98 - 111 mmol/L   CO2 19 (L) 22 - 32 mmol/L   Glucose, Bld 214 (H) 70 - 99 mg/dL    Comment: Glucose reference range applies only to samples taken after fasting for at least 8 hours.   BUN 19 6 - 20 mg/dL   Creatinine 9.53 9.55 - 1.00 mg/dL   Calcium  8.9 8.9 - 10.3 mg/dL   Total Protein 6.8 6.5 - 8.1 g/dL   Albumin 4.3 3.5 - 5.0 g/dL   AST 15 15 - 41 U/L   ALT 30 0 - 44 U/L   Alkaline Phosphatase 63 38 - 126 U/L   Total Bilirubin 1.0 0.0 - 1.2 mg/dL   GFR, Estimated >39 >39 mL/min    Comment: (NOTE) Calculated using the  CKD-EPI Creatinine Equation (2021)    Anion gap 10 5 - 15    Comment: Performed at [facility], [address] [place], [place] [address]  CBC with Differential (Cancer Center Only)     Status: Abnormal   Collection Time: 05/12/24  8:25 AM  Result Value Ref Range   WBC Count 18.1 (H) 4.0 - 10.5 K/uL   RBC 4.23 3.87 - 5.11 MIL/uL   Hemoglobin 11.8 (L) 12.0 - 15.0 g/dL   HCT 65.2 (L) 63.9 - 53.9 %   MCV 82.0 80.0 - 100.0 fL   MCH 27.9 26.0 - 34.0 pg   MCHC 34.0 30.0 - 36.0 g/dL   RDW 84.9 88.4 - 84.4 %   Platelet Count 248 150 - 400 K/uL   nRBC 0.0 0.0 - 0.2 %   Neutrophils Relative % 89 %   Neutro Abs 16.1 (H) 1.7 - 7.7 K/uL   Lymphocytes Relative 4 %   Lymphs Abs 0.8 0.7 - 4.0 K/uL   Monocytes Relative 3 %   Monocytes Absolute 0.5 0.1 -  1.0 K/uL   Eosinophils Relative 0 %   Eosinophils Absolute 0.0 0.0 - 0.5 K/uL   Basophils Relative 0 %   Basophils Absolute 0.1 0.0 - 0.1 K/uL   Immature Granulocytes 4 %   Abs Immature Granulocytes 0.64 (H) 0.00 - 0.07 K/uL    Comment: Performed at [facility], [address] [place], [place] [address]  CBC with Differential (Cancer Center Only)     Status: Abnormal   Collection Time: 06/02/24  8:22 AM  Result Value Ref Range   WBC Count 13.1 (H) 4.0 - 10.5 K/uL   RBC 4.37 3.87 - 5.11 MIL/uL   Hemoglobin 12.1 12.0 - 15.0 g/dL   HCT 63.1 63.9 - 53.9 %   MCV 84.2 80.0 - 100.0 fL   MCH 27.7 26.0 - 34.0 pg   MCHC 32.9 30.0 - 36.0 g/dL   RDW 82.7 (H) 88.4 - 84.4 %   Platelet Count 232 150 - 400 K/uL   nRBC 0.0 0.0 - 0.2 %   Neutrophils Relative % 86 %   Neutro Abs 11.2 (H) 1.7 - 7.7 K/uL   Lymphocytes Relative 8 %   Lymphs Abs 1.0 0.7 - 4.0 K/uL   Monocytes Relative 4 %   Monocytes Absolute 0.6 0.1 - 1.0 K/uL   Eosinophils Relative 0 %   Eosinophils Absolute 0.0 0.0 - 0.5 K/uL   Basophils Relative 0 %   Basophils Absolute 0.0 0.0 - 0.1 K/uL   Immature Granulocytes 2 %   Abs Immature Granulocytes 0.24 (H) 0.00 - 0.07  K/uL    Comment: Performed at [facility], [address] [place], [place] [address]  CMP (Cancer Center only)     Status: Abnormal   Collection Time: 06/02/24  8:22 AM  Result Value Ref Range   Sodium 136 135 - 145 mmol/L   Potassium 3.7 3.5 - 5.1 mmol/L   Chloride 104 98 - 111 mmol/L   CO2 21 (L) 22 - 32 mmol/L   Glucose, Bld 183 (H) 70 - 99 mg/dL    Comment: Glucose reference range applies only to samples taken after fasting for at least 8 hours.   BUN 22 (H) 6 - 20 mg/dL   Creatinine 9.49 9.55 - 1.00 mg/dL   Calcium  9.5 8.9 - 10.3 mg/dL   Total Protein 7.5 6.5 - 8.1 g/dL   Albumin 4.5 3.5 - 5.0 g/dL   AST 19 15 - 41 U/L   ALT 28 0 - 44 U/L   Alkaline Phosphatase 69 38 - 126 U/L   Total Bilirubin 1.3 (H) 0.0 - 1.2 mg/dL   GFR, Estimated >39 >39 mL/min    Comment: (NOTE) Calculated using the CKD-EPI Creatinine Equation (2021)    Anion gap 11 5 - 15    Comment: Performed at [facility], [address] [place], [place] [address]  CMP (Cancer Center only)     Status: Abnormal   Collection Time: 06/23/24 10:41 AM  Result Value Ref Range   Sodium 135 135 - 145 mmol/L   Potassium 3.7 3.5 - 5.1 mmol/L   Chloride 104 98 - 111 mmol/L   CO2 20 (L) 22 - 32 mmol/L   Glucose, Bld 186 (H) 70 - 99 mg/dL    Comment: Glucose reference range applies only to samples taken after fasting for at least 8 hours.   BUN 20 6 - 20 mg/dL   Creatinine 9.44 9.55 - 1.00 mg/dL   Calcium  9.1 8.9 -  10.3 mg/dL   Total Protein 7.2 6.5 - 8.1 g/dL   Albumin 4.4 3.5 - 5.0 g/dL   AST 22 15 - 41 U/L   ALT 21 0 - 44 U/L   Alkaline Phosphatase 66 38 - 126 U/L   Total Bilirubin 0.8 0.0 - 1.2 mg/dL   GFR, Estimated >39 >39 mL/min    Comment: (NOTE) Calculated using the CKD-EPI Creatinine Equation (2021)    Anion gap 11 5 - 15    Comment: Performed at [facility], [address] [place], [place] [address]  CBC with Differential (Cancer Center Only)     Status: Abnormal   Collection  Time: 06/23/24 10:41 AM  Result Value Ref Range   WBC Count 14.4 (H) 4.0 - 10.5 K/uL   RBC 4.15 3.87 - 5.11 MIL/uL   Hemoglobin 11.7 (L) 12.0 - 15.0 g/dL   HCT 64.3 (L) 63.9 - 53.9 %   MCV 85.8 80.0 - 100.0 fL   MCH 28.2 26.0 - 34.0 pg   MCHC 32.9 30.0 - 36.0 g/dL   RDW 81.2 (H) 88.4 - 84.4 %   Platelet Count 208 150 - 400 K/uL   nRBC 0.0 0.0 - 0.2 %   Neutrophils Relative % 84 %   Neutro Abs 12.2 (H) 1.7 - 7.7 K/uL   Lymphocytes Relative 7 %   Lymphs Abs 1.0 0.7 - 4.0 K/uL   Monocytes Relative 5 %   Monocytes Absolute 0.7 0.1 - 1.0 K/uL   Eosinophils Relative 0 %   Eosinophils Absolute 0.0 0.0 - 0.5 K/uL   Basophils Relative 1 %   Basophils Absolute 0.1 0.0 - 0.1 K/uL   Immature Granulocytes 3 %   Abs Immature Granulocytes 0.40 (H) 0.00 - 0.07 K/uL    Comment: Performed at [facility], [address] [place], [place] [address]  Lipid panel     Status: Abnormal   Collection Time: 07/06/24  9:02 AM  Result Value Ref Range   Cholesterol, Total 90 (L) 100 - 199 mg/dL   Triglycerides 801 (H) 0 - 149 mg/dL   HDL 21 (L) >60 mg/dL   VLDL Cholesterol Cal 32 5 - 40 mg/dL   LDL Chol Calc (NIH) 37 0 - 99 mg/dL   Chol/HDL Ratio 4.3 0.0 - 4.4 ratio    Comment:                                   T. Chol/HDL Ratio                                             Men  Women                               1/2 Avg.Risk  3.4    3.3                                   Avg.Risk  5.0    4.4                                2X Avg.Risk  9.6  7.1                                3X Avg.Risk 23.4   11.0   CMP14+EGFR     Status: Abnormal   Collection Time: 07/06/24  9:02 AM  Result Value Ref Range   Glucose 118 (H) 70 - 99 mg/dL   BUN 11 6 - 24 mg/dL   Creatinine, Ser 9.48 (L) 0.57 - 1.00 mg/dL   eGFR 889 >40 fO/fpw/8.26   BUN/Creatinine Ratio 22 9 - 23   Sodium 145 (H) 134 - 144 mmol/L   Potassium 3.9 3.5 - 5.2 mmol/L   Chloride 107 (H) 96 - 106 mmol/L   CO2 18 (L) 20 - 29 mmol/L   Calcium   9.1 8.7 - 10.2 mg/dL   Total Protein 6.1 6.0 - 8.5 g/dL   Albumin 4.1 3.8 - 4.9 g/dL   Globulin, Total 2.0 1.5 - 4.5 g/dL   Bilirubin Total 0.5 0.0 - 1.2 mg/dL   Alkaline Phosphatase 130 (H) 44 - 121 IU/L   AST 16 0 - 40 IU/L   ALT 15 0 - 32 IU/L  TSH     Status: None   Collection Time: 07/06/24  9:02 AM  Result Value Ref Range   TSH 1.250 0.450 - 4.500 uIU/mL  Hemoglobin A1c     Status: Abnormal   Collection Time: 07/06/24  9:02 AM  Result Value Ref Range   Hgb A1c MFr Bld 6.6 (H) 4.8 - 5.6 %    Comment:          Prediabetes: 5.7 - 6.4          Diabetes: >6.4          Glycemic control for adults with diabetes: <7.0    Est. average glucose Bld gHb Est-mCnc 143 mg/dL      Assessment & Plan:  Continue same medications  Problem List Items Addressed This Visit       Cardiovascular and Mediastinum   Hypertension - Primary     Endocrine   Type 2 diabetes mellitus with hyperglycemia, without long-term current use of insulin (HCC) (Chronic)     Other   Mixed hyperlipidemia    Return in about 4 months (around 11/14/2024) for fasting labs prior.   Total time spent: 25 minutes  Google, NP  07/14/2024   This document may have been prepared by Dragon Voice Recognition software and as such may include unintentional dictation errors.

## 2024-07-15 ENCOUNTER — Other Ambulatory Visit: Payer: Self-pay

## 2024-07-15 ENCOUNTER — Ambulatory Visit
Admission: RE | Admit: 2024-07-15 | Discharge: 2024-07-15 | Disposition: A | Discharge status: Home / Self Care | Source: Ambulatory Visit | Attending: Radiation Oncology | Admitting: Radiation Oncology

## 2024-07-15 DIAGNOSIS — C50912 Malignant neoplasm of unspecified site of left female breast: Secondary | ICD-10-CM | POA: Diagnosis not present

## 2024-07-15 LAB — RAD ONC ARIA SESSION SUMMARY
Course Elapsed Days: 1
Plan Fractions Treated to Date: 2
Plan Prescribed Dose Per Fraction: 2.66 Gy
Plan Total Fractions Prescribed: 16
Plan Total Prescribed Dose: 42.56 Gy
Reference Point Dosage Given to Date: 5.32 Gy
Reference Point Session Dosage Given: 2.66 Gy
Session Number: 2

## 2024-07-16 ENCOUNTER — Ambulatory Visit
Admission: RE | Admit: 2024-07-16 | Discharge: 2024-07-16 | Disposition: A | Discharge status: Home / Self Care | Source: Ambulatory Visit | Attending: Radiation Oncology | Admitting: Radiation Oncology

## 2024-07-16 ENCOUNTER — Other Ambulatory Visit: Payer: Self-pay

## 2024-07-16 DIAGNOSIS — C50912 Malignant neoplasm of unspecified site of left female breast: Secondary | ICD-10-CM | POA: Insufficient documentation

## 2024-07-16 DIAGNOSIS — Z510 Encounter for antineoplastic radiation therapy: Secondary | ICD-10-CM | POA: Insufficient documentation

## 2024-07-16 DIAGNOSIS — Z170 Estrogen receptor positive status [ER+]: Secondary | ICD-10-CM | POA: Insufficient documentation

## 2024-07-16 DIAGNOSIS — C50312 Malignant neoplasm of lower-inner quadrant of left female breast: Secondary | ICD-10-CM | POA: Insufficient documentation

## 2024-07-16 DIAGNOSIS — Z5111 Encounter for antineoplastic chemotherapy: Secondary | ICD-10-CM | POA: Diagnosis present

## 2024-07-16 LAB — RAD ONC ARIA SESSION SUMMARY
Course Elapsed Days: 2
Plan Fractions Treated to Date: 3
Plan Prescribed Dose Per Fraction: 2.66 Gy
Plan Total Fractions Prescribed: 16
Plan Total Prescribed Dose: 42.56 Gy
Reference Point Dosage Given to Date: 7.98 Gy
Reference Point Session Dosage Given: 2.66 Gy
Session Number: 3

## 2024-07-19 ENCOUNTER — Other Ambulatory Visit: Payer: Self-pay

## 2024-07-19 ENCOUNTER — Ambulatory Visit
Admission: RE | Admit: 2024-07-19 | Discharge: 2024-07-19 | Disposition: A | Discharge status: Home / Self Care | Source: Ambulatory Visit | Attending: Radiation Oncology | Admitting: Radiation Oncology

## 2024-07-19 DIAGNOSIS — Z510 Encounter for antineoplastic radiation therapy: Secondary | ICD-10-CM | POA: Diagnosis not present

## 2024-07-19 LAB — RAD ONC ARIA SESSION SUMMARY
Course Elapsed Days: 5
Plan Fractions Treated to Date: 4
Plan Prescribed Dose Per Fraction: 2.66 Gy
Plan Total Fractions Prescribed: 16
Plan Total Prescribed Dose: 42.56 Gy
Reference Point Dosage Given to Date: 10.64 Gy
Reference Point Session Dosage Given: 2.66 Gy
Session Number: 4

## 2024-07-20 ENCOUNTER — Ambulatory Visit
Admission: RE | Admit: 2024-07-20 | Discharge: 2024-07-20 | Disposition: A | Discharge status: Home / Self Care | Source: Ambulatory Visit | Attending: Radiation Oncology | Admitting: Radiation Oncology

## 2024-07-20 ENCOUNTER — Encounter: Payer: Self-pay | Admitting: *Deleted

## 2024-07-20 ENCOUNTER — Other Ambulatory Visit: Payer: Self-pay

## 2024-07-20 DIAGNOSIS — Z510 Encounter for antineoplastic radiation therapy: Secondary | ICD-10-CM | POA: Diagnosis not present

## 2024-07-20 LAB — RAD ONC ARIA SESSION SUMMARY
Course Elapsed Days: 6
Plan Fractions Treated to Date: 5
Plan Prescribed Dose Per Fraction: 2.66 Gy
Plan Total Fractions Prescribed: 16
Plan Total Prescribed Dose: 42.56 Gy
Reference Point Dosage Given to Date: 13.3 Gy
Reference Point Session Dosage Given: 2.66 Gy
Session Number: 5

## 2024-07-21 ENCOUNTER — Inpatient Hospital Stay

## 2024-07-21 ENCOUNTER — Ambulatory Visit
Admission: RE | Admit: 2024-07-21 | Discharge: 2024-07-21 | Disposition: A | Discharge status: Home / Self Care | Source: Ambulatory Visit | Attending: Radiation Oncology | Admitting: Radiation Oncology

## 2024-07-21 ENCOUNTER — Other Ambulatory Visit: Payer: Self-pay

## 2024-07-21 DIAGNOSIS — Z510 Encounter for antineoplastic radiation therapy: Secondary | ICD-10-CM | POA: Diagnosis not present

## 2024-07-21 LAB — RAD ONC ARIA SESSION SUMMARY
Course Elapsed Days: 7
Plan Fractions Treated to Date: 6
Plan Prescribed Dose Per Fraction: 2.66 Gy
Plan Total Fractions Prescribed: 16
Plan Total Prescribed Dose: 42.56 Gy
Reference Point Dosage Given to Date: 15.96 Gy
Reference Point Session Dosage Given: 2.66 Gy
Session Number: 6

## 2024-07-22 ENCOUNTER — Ambulatory Visit
Admission: RE | Admit: 2024-07-22 | Discharge: 2024-07-22 | Disposition: A | Discharge status: Home / Self Care | Source: Ambulatory Visit | Attending: Radiation Oncology | Admitting: Radiation Oncology

## 2024-07-22 ENCOUNTER — Other Ambulatory Visit: Payer: Self-pay

## 2024-07-22 DIAGNOSIS — Z510 Encounter for antineoplastic radiation therapy: Secondary | ICD-10-CM | POA: Diagnosis not present

## 2024-07-22 LAB — RAD ONC ARIA SESSION SUMMARY
Course Elapsed Days: 8
Plan Fractions Treated to Date: 7
Plan Prescribed Dose Per Fraction: 2.66 Gy
Plan Total Fractions Prescribed: 16
Plan Total Prescribed Dose: 42.56 Gy
Reference Point Dosage Given to Date: 18.62 Gy
Reference Point Session Dosage Given: 2.66 Gy
Session Number: 7

## 2024-07-23 ENCOUNTER — Ambulatory Visit
Admission: RE | Admit: 2024-07-23 | Discharge: 2024-07-23 | Disposition: A | Discharge status: Home / Self Care | Source: Ambulatory Visit | Attending: Radiation Oncology | Admitting: Radiation Oncology

## 2024-07-23 ENCOUNTER — Other Ambulatory Visit: Payer: Self-pay

## 2024-07-23 DIAGNOSIS — Z510 Encounter for antineoplastic radiation therapy: Secondary | ICD-10-CM | POA: Diagnosis not present

## 2024-07-23 LAB — RAD ONC ARIA SESSION SUMMARY
Course Elapsed Days: 9
Plan Fractions Treated to Date: 8
Plan Prescribed Dose Per Fraction: 2.66 Gy
Plan Total Fractions Prescribed: 16
Plan Total Prescribed Dose: 42.56 Gy
Reference Point Dosage Given to Date: 21.28 Gy
Reference Point Session Dosage Given: 2.66 Gy
Session Number: 8

## 2024-07-26 ENCOUNTER — Ambulatory Visit
Admission: RE | Admit: 2024-07-26 | Discharge: 2024-07-26 | Disposition: A | Discharge status: Home / Self Care | Source: Ambulatory Visit | Attending: Radiation Oncology | Admitting: Radiation Oncology

## 2024-07-26 ENCOUNTER — Other Ambulatory Visit: Payer: Self-pay

## 2024-07-26 DIAGNOSIS — Z510 Encounter for antineoplastic radiation therapy: Secondary | ICD-10-CM | POA: Diagnosis not present

## 2024-07-26 LAB — RAD ONC ARIA SESSION SUMMARY
Course Elapsed Days: 12
Plan Fractions Treated to Date: 9
Plan Prescribed Dose Per Fraction: 2.66 Gy
Plan Total Fractions Prescribed: 16
Plan Total Prescribed Dose: 42.56 Gy
Reference Point Dosage Given to Date: 23.94 Gy
Reference Point Session Dosage Given: 2.66 Gy
Session Number: 9

## 2024-07-27 ENCOUNTER — Other Ambulatory Visit: Payer: Self-pay

## 2024-07-27 ENCOUNTER — Ambulatory Visit
Admission: RE | Admit: 2024-07-27 | Discharge: 2024-07-27 | Disposition: A | Discharge status: Home / Self Care | Source: Ambulatory Visit | Attending: Radiation Oncology | Admitting: Radiation Oncology

## 2024-07-27 DIAGNOSIS — Z510 Encounter for antineoplastic radiation therapy: Secondary | ICD-10-CM | POA: Diagnosis not present

## 2024-07-27 LAB — RAD ONC ARIA SESSION SUMMARY
Course Elapsed Days: 13
Plan Fractions Treated to Date: 10
Plan Prescribed Dose Per Fraction: 2.66 Gy
Plan Total Fractions Prescribed: 16
Plan Total Prescribed Dose: 42.56 Gy
Reference Point Dosage Given to Date: 26.6 Gy
Reference Point Session Dosage Given: 2.66 Gy
Session Number: 10

## 2024-07-28 ENCOUNTER — Telehealth: Payer: Self-pay

## 2024-07-28 ENCOUNTER — Inpatient Hospital Stay: Attending: Oncology

## 2024-07-28 ENCOUNTER — Other Ambulatory Visit: Payer: Self-pay

## 2024-07-28 ENCOUNTER — Ambulatory Visit
Admission: RE | Admit: 2024-07-28 | Discharge: 2024-07-28 | Disposition: A | Discharge status: Home / Self Care | Source: Ambulatory Visit | Attending: Radiation Oncology | Admitting: Radiation Oncology

## 2024-07-28 DIAGNOSIS — Z510 Encounter for antineoplastic radiation therapy: Secondary | ICD-10-CM | POA: Insufficient documentation

## 2024-07-28 DIAGNOSIS — Z170 Estrogen receptor positive status [ER+]: Secondary | ICD-10-CM | POA: Insufficient documentation

## 2024-07-28 DIAGNOSIS — C50912 Malignant neoplasm of unspecified site of left female breast: Secondary | ICD-10-CM

## 2024-07-28 DIAGNOSIS — C50312 Malignant neoplasm of lower-inner quadrant of left female breast: Secondary | ICD-10-CM | POA: Insufficient documentation

## 2024-07-28 LAB — RAD ONC ARIA SESSION SUMMARY
Course Elapsed Days: 14
Plan Fractions Treated to Date: 11
Plan Prescribed Dose Per Fraction: 2.66 Gy
Plan Total Fractions Prescribed: 16
Plan Total Prescribed Dose: 42.56 Gy
Reference Point Dosage Given to Date: 29.26 Gy
Reference Point Session Dosage Given: 2.66 Gy
Session Number: 11

## 2024-07-28 LAB — CBC (CANCER CENTER ONLY)
HCT: 37.1 % (ref 36.0–46.0)
Hemoglobin: 12.1 g/dL (ref 12.0–15.0)
MCH: 28.5 pg (ref 26.0–34.0)
MCHC: 32.6 g/dL (ref 30.0–36.0)
MCV: 87.5 fL (ref 80.0–100.0)
Platelet Count: 270 K/uL (ref 150–400)
RBC: 4.24 MIL/uL (ref 3.87–5.11)
RDW: 17.2 % — ABNORMAL HIGH (ref 11.5–15.5)
WBC Count: 5.7 K/uL (ref 4.0–10.5)
nRBC: 0 % (ref 0.0–0.2)

## 2024-07-28 NOTE — Telephone Encounter (Signed)
 Patient scheduled for final XRT on 08/17/24. Please schedule MD approx 2 -3 weeks after that date. Please notify pt of appt details.

## 2024-07-29 ENCOUNTER — Other Ambulatory Visit: Payer: Self-pay

## 2024-07-29 ENCOUNTER — Ambulatory Visit
Admission: RE | Admit: 2024-07-29 | Discharge: 2024-07-29 | Disposition: A | Discharge status: Home / Self Care | Source: Ambulatory Visit | Attending: Radiation Oncology | Admitting: Radiation Oncology

## 2024-07-29 DIAGNOSIS — Z510 Encounter for antineoplastic radiation therapy: Secondary | ICD-10-CM | POA: Diagnosis not present

## 2024-07-29 LAB — RAD ONC ARIA SESSION SUMMARY
Course Elapsed Days: 15
Plan Fractions Treated to Date: 12
Plan Prescribed Dose Per Fraction: 2.66 Gy
Plan Total Fractions Prescribed: 16
Plan Total Prescribed Dose: 42.56 Gy
Reference Point Dosage Given to Date: 31.92 Gy
Reference Point Session Dosage Given: 2.66 Gy
Session Number: 12

## 2024-07-30 ENCOUNTER — Ambulatory Visit
Admission: RE | Admit: 2024-07-30 | Discharge: 2024-07-30 | Disposition: A | Discharge status: Home / Self Care | Source: Ambulatory Visit | Attending: Radiation Oncology | Admitting: Radiation Oncology

## 2024-07-30 ENCOUNTER — Other Ambulatory Visit: Payer: Self-pay

## 2024-07-30 DIAGNOSIS — Z510 Encounter for antineoplastic radiation therapy: Secondary | ICD-10-CM | POA: Diagnosis not present

## 2024-07-30 LAB — RAD ONC ARIA SESSION SUMMARY
Course Elapsed Days: 16
Plan Fractions Treated to Date: 13
Plan Prescribed Dose Per Fraction: 2.66 Gy
Plan Total Fractions Prescribed: 16
Plan Total Prescribed Dose: 42.56 Gy
Reference Point Dosage Given to Date: 34.58 Gy
Reference Point Session Dosage Given: 2.66 Gy
Session Number: 13

## 2024-08-02 ENCOUNTER — Ambulatory Visit
Admission: RE | Admit: 2024-08-02 | Discharge: 2024-08-02 | Disposition: A | Discharge status: Home / Self Care | Source: Ambulatory Visit | Attending: Radiation Oncology | Admitting: Radiation Oncology

## 2024-08-02 ENCOUNTER — Other Ambulatory Visit: Payer: Self-pay

## 2024-08-02 DIAGNOSIS — Z510 Encounter for antineoplastic radiation therapy: Secondary | ICD-10-CM | POA: Diagnosis not present

## 2024-08-02 LAB — RAD ONC ARIA SESSION SUMMARY
Course Elapsed Days: 19
Plan Fractions Treated to Date: 14
Plan Prescribed Dose Per Fraction: 2.66 Gy
Plan Total Fractions Prescribed: 16
Plan Total Prescribed Dose: 42.56 Gy
Reference Point Dosage Given to Date: 37.24 Gy
Reference Point Session Dosage Given: 2.66 Gy
Session Number: 14

## 2024-08-03 ENCOUNTER — Ambulatory Visit
Admission: RE | Admit: 2024-08-03 | Discharge: 2024-08-03 | Disposition: A | Discharge status: Home / Self Care | Source: Ambulatory Visit | Attending: Radiation Oncology | Admitting: Radiation Oncology

## 2024-08-03 ENCOUNTER — Other Ambulatory Visit: Payer: Self-pay

## 2024-08-03 ENCOUNTER — Ambulatory Visit: Payer: BC Managed Care – PPO | Admitting: Dermatology

## 2024-08-03 DIAGNOSIS — D225 Melanocytic nevi of trunk: Secondary | ICD-10-CM

## 2024-08-03 DIAGNOSIS — D2371 Other benign neoplasm of skin of right lower limb, including hip: Secondary | ICD-10-CM

## 2024-08-03 DIAGNOSIS — L814 Other melanin hyperpigmentation: Secondary | ICD-10-CM

## 2024-08-03 DIAGNOSIS — Z1283 Encounter for screening for malignant neoplasm of skin: Secondary | ICD-10-CM | POA: Diagnosis not present

## 2024-08-03 DIAGNOSIS — L578 Other skin changes due to chronic exposure to nonionizing radiation: Secondary | ICD-10-CM | POA: Diagnosis not present

## 2024-08-03 DIAGNOSIS — Z86018 Personal history of other benign neoplasm: Secondary | ICD-10-CM

## 2024-08-03 DIAGNOSIS — Z510 Encounter for antineoplastic radiation therapy: Secondary | ICD-10-CM | POA: Diagnosis not present

## 2024-08-03 DIAGNOSIS — L821 Other seborrheic keratosis: Secondary | ICD-10-CM

## 2024-08-03 DIAGNOSIS — D229 Melanocytic nevi, unspecified: Secondary | ICD-10-CM

## 2024-08-03 DIAGNOSIS — D239 Other benign neoplasm of skin, unspecified: Secondary | ICD-10-CM

## 2024-08-03 DIAGNOSIS — W908XXA Exposure to other nonionizing radiation, initial encounter: Secondary | ICD-10-CM

## 2024-08-03 DIAGNOSIS — D1801 Hemangioma of skin and subcutaneous tissue: Secondary | ICD-10-CM

## 2024-08-03 LAB — RAD ONC ARIA SESSION SUMMARY
Course Elapsed Days: 20
Plan Fractions Treated to Date: 15
Plan Prescribed Dose Per Fraction: 2.66 Gy
Plan Total Fractions Prescribed: 16
Plan Total Prescribed Dose: 42.56 Gy
Reference Point Dosage Given to Date: 39.9 Gy
Reference Point Session Dosage Given: 2.66 Gy
Session Number: 15

## 2024-08-03 NOTE — Progress Notes (Signed)
 Follow-Up Visit   Subjective  Zoe [name] is a 58 y.o. female who presents for the following: Skin Cancer Screening and Full Body Skin Exam Hx of dysplastic nevi, h/o breast cancer Left breast, had lumpectomy and chemo, now completing radiation  The patient presents for Total-Body Skin Exam (TBSE) for skin cancer screening and mole check. The patient has spots, moles and lesions to be evaluated, some may be new or changing and the patient may have concern these could be cancer.    The following portions of the chart were reviewed this encounter and updated as appropriate: medications, allergies, medical history  Review of Systems:  No other skin or systemic complaints except as noted in HPI or Assessment and Plan.  Objective  Well appearing patient in no apparent distress; mood and affect are within normal limits.  A full examination was performed including scalp, head, eyes, ears, nose, lips, neck, chest, axillae, abdomen, back, buttocks, bilateral upper extremities, bilateral lower extremities, hands, feet, fingers, toes, fingernails, and toenails. All findings within normal limits unless otherwise noted below.   Relevant physical exam findings are noted in the Assessment and Plan.    Assessment & Plan   SKIN CANCER SCREENING PERFORMED TODAY.  ACTINIC DAMAGE - Chronic condition, secondary to cumulative UV/sun exposure - diffuse scaly erythematous macules with underlying dyspigmentation - Recommend daily broad spectrum sunscreen SPF 30+ to sun-exposed areas, reapply every 2 hours as needed.  - Staying in the shade or wearing long sleeves, sun glasses (UVA+UVB protection) and wide brim hats (4-inch brim around the entire circumference of the hat) are also recommended for sun protection.  - Call for new or changing lesions.  LENTIGINES, SEBORRHEIC KERATOSES, HEMANGIOMAS - Benign normal skin lesions - Benign-appearing - Call for any changes  MELANOCYTIC NEVI - Tan-brown  and/or pink-flesh-colored symmetric macules and papules, including flesh papule at left post shoulder  - 2 mm medium brown macule, regular, right lateral thigh - 0.6 x 0.5 cm speckled medium dark brown macule, right inferior breast - 0.7 x 0.4 cm speckled brown papule, left 4th plantar toe - Benign appearing on exam today - Observation - Call clinic for new or changing moles - Recommend daily use of broad spectrum spf 30+ sunscreen to sun-exposed areas.    DERMATOFIBROMA Exam: Firm pink/brown papulenodule with dimple sign of the right medial lower leg, right upper buttock Treatment Plan: A dermatofibroma is a benign growth possibly related to trauma, such as an insect bite, cut from shaving, or inflamed acne-type bump.  Treatment options to remove include shave or excision with resulting scar and risk of recurrence.  Since benign-appearing and not bothersome, will observe for now.  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  Call if any new or changing lesions are noted between office visits  SKIN CANCER SCREENING   ACTINIC SKIN DAMAGE   LENTIGO   SEBORRHEIC KERATOSIS   HEMANGIOMA OF SKIN   NEVUS   DERMATOFIBROMA   NEVUS SPILUS   HISTORY OF DYSPLASTIC NEVUS   Return in about 1 year (around 08/03/2025) for tbse.  I, Eleanor Blush, CMA, am acting as scribe for  Rexene Rattler, MD.   Documentation: I have reviewed the above documentation for accuracy and completeness, and I agree with the above.  Rexene Rattler, MD

## 2024-08-03 NOTE — Patient Instructions (Signed)

## 2024-08-04 ENCOUNTER — Ambulatory Visit
Admission: RE | Admit: 2024-08-04 | Discharge: 2024-08-04 | Disposition: A | Discharge status: Home / Self Care | Source: Ambulatory Visit | Attending: Radiation Oncology | Admitting: Radiation Oncology

## 2024-08-04 ENCOUNTER — Other Ambulatory Visit: Payer: Self-pay

## 2024-08-04 ENCOUNTER — Inpatient Hospital Stay

## 2024-08-04 ENCOUNTER — Ambulatory Visit

## 2024-08-04 DIAGNOSIS — C50912 Malignant neoplasm of unspecified site of left female breast: Secondary | ICD-10-CM

## 2024-08-04 DIAGNOSIS — Z510 Encounter for antineoplastic radiation therapy: Secondary | ICD-10-CM | POA: Diagnosis not present

## 2024-08-04 LAB — RAD ONC ARIA SESSION SUMMARY
Course Elapsed Days: 21
Plan Fractions Treated to Date: 16
Plan Prescribed Dose Per Fraction: 2.66 Gy
Plan Total Fractions Prescribed: 16
Plan Total Prescribed Dose: 42.56 Gy
Reference Point Dosage Given to Date: 42.56 Gy
Reference Point Session Dosage Given: 2.66 Gy
Session Number: 16

## 2024-08-04 LAB — CBC (CANCER CENTER ONLY)
HCT: 36.6 % (ref 36.0–46.0)
Hemoglobin: 12 g/dL (ref 12.0–15.0)
MCH: 28.4 pg (ref 26.0–34.0)
MCHC: 32.8 g/dL (ref 30.0–36.0)
MCV: 86.7 fL (ref 80.0–100.0)
Platelet Count: 175 K/uL (ref 150–400)
RBC: 4.22 MIL/uL (ref 3.87–5.11)
RDW: 15.9 % — ABNORMAL HIGH (ref 11.5–15.5)
WBC Count: 3.9 K/uL — ABNORMAL LOW (ref 4.0–10.5)
nRBC: 0 % (ref 0.0–0.2)

## 2024-08-05 ENCOUNTER — Encounter: Payer: Self-pay | Admitting: Cardiovascular Disease

## 2024-08-05 ENCOUNTER — Ambulatory Visit
Admission: RE | Admit: 2024-08-05 | Discharge: 2024-08-05 | Disposition: A | Discharge status: Home / Self Care | Source: Ambulatory Visit | Attending: Radiation Oncology | Admitting: Radiation Oncology

## 2024-08-05 ENCOUNTER — Other Ambulatory Visit: Payer: Self-pay

## 2024-08-05 ENCOUNTER — Ambulatory Visit (INDEPENDENT_AMBULATORY_CARE_PROVIDER_SITE_OTHER): Admitting: Cardiovascular Disease

## 2024-08-05 VITALS — BP 100/62 | HR 76 | Ht 64.0 in | Wt 154.0 lb

## 2024-08-05 DIAGNOSIS — I214 Non-ST elevation (NSTEMI) myocardial infarction: Secondary | ICD-10-CM

## 2024-08-05 DIAGNOSIS — I10 Essential (primary) hypertension: Secondary | ICD-10-CM | POA: Diagnosis not present

## 2024-08-05 DIAGNOSIS — Z510 Encounter for antineoplastic radiation therapy: Secondary | ICD-10-CM | POA: Diagnosis not present

## 2024-08-05 DIAGNOSIS — I2510 Atherosclerotic heart disease of native coronary artery without angina pectoris: Secondary | ICD-10-CM | POA: Diagnosis not present

## 2024-08-05 DIAGNOSIS — E782 Mixed hyperlipidemia: Secondary | ICD-10-CM | POA: Diagnosis not present

## 2024-08-05 DIAGNOSIS — C50912 Malignant neoplasm of unspecified site of left female breast: Secondary | ICD-10-CM

## 2024-08-05 LAB — RAD ONC ARIA SESSION SUMMARY
Course Elapsed Days: 22
Plan Fractions Treated to Date: 1
Plan Prescribed Dose Per Fraction: 2 Gy
Plan Total Fractions Prescribed: 8
Plan Total Prescribed Dose: 16 Gy
Reference Point Dosage Given to Date: 2 Gy
Reference Point Session Dosage Given: 2 Gy
Session Number: 17

## 2024-08-05 NOTE — Progress Notes (Signed)
 Cardiology Office Note   Date:  08/05/2024   ID:  Zoe [name], DOB 1966-08-06, MRN [id number]  PCP:  Carin Gauze, NP  Cardiologist:  Denyse Bathe, MD      History of Present Illness: Zoe [name] is a 58 y.o. female who presents for  Chief Complaint  Patient presents with   Follow-up    3 months follow up    Doing well, done with chemo and getting RT.      Past Medical History:  Diagnosis Date   Allergic rhinitis    Asthma    Complication of anesthesia    a.) delayed/prolonged emergence   Coronary artery disease    Dysplastic nevi 08/30/2020   Right lateral distal deltoid, left lower back post waistline. 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   T2DM (type 2 diabetes mellitus) (HCC)    Unstable angina (HCC)      Past Surgical History:  Procedure Laterality Date   AXILLARY SENTINEL NODE BIOPSY Left 03/17/2024   Procedure: BIOPSY, LYMPH NODE, SENTINEL, AXILLARY;  Surgeon: Rodolph Romano, MD;  Location: [facility];  Service: General;  Laterality: Left;   BREAST BIOPSY Left 03/03/2024   US  LT BREAST Bx venus marker,   8:00 8 cmfn : - INVASIVE DUCTAL CARCINOMA   BREAST BIOPSY Left 03/11/2024   US  LT RADIO FREQUENCY TAG LOC US  GUIDE 03/11/2024 [facility]   BREAST LUMPECTOMY Left 03/17/2024   Procedure: BREAST LUMPECTOMY;  Surgeon: Rodolph Romano, MD;  Location: [facility];   Service: General;  Laterality: Left;  w/ RF tag   CARDIAC CATHETERIZATION     COLONOSCOPY  2001   COLONOSCOPY WITH PROPOFOL  N/A 11/05/2019   Procedure: COLONOSCOPY WITH PROPOFOL ;  Surgeon: Janalyn Keene NOVAK, MD;  Location: [facility];  Service: Endoscopy;  Laterality: N/A;   CORONARY ANGIOPLASTY     DILATATION & CURETTAGE/HYSTEROSCOPY WITH MYOSURE N/A 02/29/2016   Procedure: DILATATION & CURETTAGE/HYSTEROSCOPY WITH MYOSURE;  Surgeon: Mitzie BROCKS Ward, MD;  Location: [facility];  Service: Gynecology;  Laterality: N/A;   DILATION AND CURETTAGE OF UTERUS     EAR CYST EXCISION Left 2015   IN OFFICE   ESOPHAGOGASTRODUODENOSCOPY     INTRAUTERINE DEVICE (IUD) INSERTION N/A 02/29/2016   Procedure: INTRAUTERINE DEVICE (IUD) INSERTION;  Surgeon: Mitzie BROCKS Ward, MD;  Location: [facility];  Service: Gynecology;  Laterality: N/A;   LEFT HEART CATH AND CORONARY ANGIOGRAPHY Left 03/16/2019   Procedure: LEFT HEART CATH AND CORONARY ANGIOGRAPHY;  Surgeon: Bathe Denyse LABOR, MD;  Location: [facility];  Service: Cardiovascular;  Laterality: Left;     Current Outpatient Medications  Medication Sig Dispense Refill   aspirin  81 MG tablet Take 81 mg by mouth daily.     Cholecalciferol (VITAMIN D-3 PO) Take 2,000 Units by mouth daily.     Coenzyme Q10 (CO Q 10 PO) Take 1 capsule by mouth daily.  FARXIGA 10 MG TABS tablet TAKE 1 TABLET BY MOUTH EVERY DAY IN THE MORNING 30 tablet 11   ibuprofen  (ADVIL ,MOTRIN ) 600 MG tablet Take 1 tablet (600 mg total) by mouth every 6 (six) hours as needed. 30 tablet 0   isosorbide  mononitrate (IMDUR ) 30 MG 24 hr tablet TAKE 1 TABLET BY MOUTH EVERY DAY 90 tablet 2   losartan-hydrochlorothiazide  (HYZAAR) 50-12.5 MG tablet TAKE 1 TABLET BY MOUTH EVERY DAY 90 tablet 0   metFORMIN  (GLUCOPHAGE ) 500 MG tablet TAKE 1 TABLET BY MOUTH 2 TIMES DAILY WITH A MEAL. 180 tablet 1   metoprolol  succinate (TOPROL -XL) 50 MG 24 hr tablet TAKE 1 TABLET BY MOUTH EVERY DAY 90 tablet 2    Omega-3 Fatty Acids (FISH OIL) 1200 MG CAPS Take 1,200 mg by mouth daily.     pantoprazole  (PROTONIX ) 40 MG tablet TAKE 1 TABLET BY MOUTH EVERY DAY 90 tablet 1   rosuvastatin  (CRESTOR ) 40 MG tablet TAKE 1 TABLET BY MOUTH EVERY DAY 90 tablet 1   No current facility-administered medications for this visit.    Allergies:   Ace inhibitors and Neosporin wound cleanser [benzalkonium chloride]    Social History:   reports that she has never smoked. She has never used smokeless tobacco. She reports current alcohol use of about 3.0 - 5.0 standard drinks of alcohol per week. She reports that she does not use drugs.   Family History:  family history includes Diabetes in her brother and another family member; Heart disease in her father and paternal grandfather; Hyperlipidemia in her mother; Hypertension in her mother.    ROS:     Review of Systems  Constitutional: Negative.   HENT: Negative.    Eyes: Negative.   Respiratory: Negative.    Gastrointestinal: Negative.   Genitourinary: Negative.   Musculoskeletal: Negative.   Skin: Negative.   Neurological: Negative.   Endo/Heme/Allergies: Negative.   Psychiatric/Behavioral: Negative.    All other systems reviewed and are negative.     All other systems are reviewed and negative.    PHYSICAL EXAM: VS:  BP 100/62   Pulse 76   Ht 5' 4 (1.626 m)   Wt 154 lb (69.9 kg)   SpO2 96%   BMI 26.43 kg/m  , BMI Body mass index is 26.43 kg/m. Last weight:  Wt Readings from Last 3 Encounters:  08/05/24 154 lb (69.9 kg)  07/14/24 151 lb (68.5 kg)  07/06/24 151 lb (68.5 kg)     Physical Exam Constitutional:      Appearance: Normal appearance.  Cardiovascular:     Rate and Rhythm: Normal rate and regular rhythm.     Heart sounds: Normal heart sounds.  Pulmonary:     Effort: Pulmonary effort is normal.     Breath sounds: Normal breath sounds.  Musculoskeletal:     Right lower leg: No edema.     Left lower leg: No edema.  Neurological:      Mental Status: She is alert.       EKG:   Recent Labs: 07/06/2024: ALT 15; BUN 11; Creatinine, Ser 0.51; Potassium 3.9; Sodium 145; TSH 1.250 08/04/2024: Hemoglobin 12.0; Platelet Count 175    Lipid Panel    Component Value Date/Time   CHOL 90 (L) 07/06/2024 0902   TRIG 198 (H) 07/06/2024 0902   HDL 21 (L) 07/06/2024 0902   CHOLHDL 4.3 07/06/2024 0902   LDLCALC 37 07/06/2024 0902      Other studies Reviewed: Additional studies/ records that were reviewed today  include:  Review of the above records demonstrates:       No data to display            ASSESSMENT AND PLAN:    ICD-10-CM   1. Mixed hyperlipidemia  E78.2 PCV ECHOCARDIOGRAM COMPLETE    2. Primary hypertension  I10 PCV ECHOCARDIOGRAM COMPLETE    3. Coronary artery disease involving native coronary artery of native heart without angina pectoris  I25.10 PCV ECHOCARDIOGRAM COMPLETE    4. NSTEMI (non-ST elevated myocardial infarction) (HCC)  I21.4 PCV ECHOCARDIOGRAM COMPLETE    5. Invasive ductal carcinoma of breast, female, left (HCC)  C50.912 PCV ECHOCARDIOGRAM COMPLETE   After RT, advise echo.       Problem List Items Addressed This Visit       Cardiovascular and Mediastinum   Coronary artery disease involving native coronary artery of native heart without angina pectoris (Chronic)   Relevant Orders   PCV ECHOCARDIOGRAM COMPLETE   Hypertension   Relevant Orders   PCV ECHOCARDIOGRAM COMPLETE   NSTEMI (non-ST elevated myocardial infarction) (HCC)   Relevant Orders   PCV ECHOCARDIOGRAM COMPLETE     Other   Invasive ductal carcinoma of breast, female, left (HCC) (Chronic)   Relevant Orders   PCV ECHOCARDIOGRAM COMPLETE   Mixed hyperlipidemia - Primary   Relevant Orders   PCV ECHOCARDIOGRAM COMPLETE       Disposition:   Return in about 6 weeks (around 09/16/2024) for echo, and f/u.    Total time spent: 30 minutes  Signed,  Denyse Bathe, MD  08/05/2024 3:29 PM    [facility]

## 2024-08-06 ENCOUNTER — Other Ambulatory Visit: Payer: Self-pay

## 2024-08-06 ENCOUNTER — Ambulatory Visit
Admission: RE | Admit: 2024-08-06 | Discharge: 2024-08-06 | Disposition: A | Discharge status: Home / Self Care | Source: Ambulatory Visit | Attending: Radiation Oncology | Admitting: Radiation Oncology

## 2024-08-06 DIAGNOSIS — Z510 Encounter for antineoplastic radiation therapy: Secondary | ICD-10-CM | POA: Diagnosis not present

## 2024-08-06 LAB — RAD ONC ARIA SESSION SUMMARY
Course Elapsed Days: 23
Plan Fractions Treated to Date: 2
Plan Prescribed Dose Per Fraction: 2 Gy
Plan Total Fractions Prescribed: 8
Plan Total Prescribed Dose: 16 Gy
Reference Point Dosage Given to Date: 4 Gy
Reference Point Session Dosage Given: 2 Gy
Session Number: 18

## 2024-08-07 ENCOUNTER — Other Ambulatory Visit: Payer: Self-pay | Admitting: Internal Medicine

## 2024-08-07 DIAGNOSIS — E1165 Type 2 diabetes mellitus with hyperglycemia: Secondary | ICD-10-CM

## 2024-08-08 ENCOUNTER — Other Ambulatory Visit: Payer: Self-pay | Admitting: Cardiovascular Disease

## 2024-08-08 DIAGNOSIS — I10 Essential (primary) hypertension: Secondary | ICD-10-CM

## 2024-08-09 ENCOUNTER — Other Ambulatory Visit: Payer: Self-pay

## 2024-08-09 ENCOUNTER — Ambulatory Visit
Admission: RE | Admit: 2024-08-09 | Discharge: 2024-08-09 | Disposition: A | Discharge status: Home / Self Care | Source: Ambulatory Visit | Attending: Radiation Oncology | Admitting: Radiation Oncology

## 2024-08-09 DIAGNOSIS — Z510 Encounter for antineoplastic radiation therapy: Secondary | ICD-10-CM | POA: Diagnosis not present

## 2024-08-09 LAB — RAD ONC ARIA SESSION SUMMARY
Course Elapsed Days: 26
Plan Fractions Treated to Date: 3
Plan Prescribed Dose Per Fraction: 2 Gy
Plan Total Fractions Prescribed: 8
Plan Total Prescribed Dose: 16 Gy
Reference Point Dosage Given to Date: 6 Gy
Reference Point Session Dosage Given: 2 Gy
Session Number: 19

## 2024-08-10 ENCOUNTER — Other Ambulatory Visit: Payer: Self-pay

## 2024-08-10 ENCOUNTER — Ambulatory Visit
Admission: RE | Admit: 2024-08-10 | Discharge: 2024-08-10 | Disposition: A | Discharge status: Home / Self Care | Source: Ambulatory Visit | Attending: Radiation Oncology | Admitting: Radiation Oncology

## 2024-08-10 DIAGNOSIS — Z510 Encounter for antineoplastic radiation therapy: Secondary | ICD-10-CM | POA: Diagnosis not present

## 2024-08-10 LAB — RAD ONC ARIA SESSION SUMMARY
Course Elapsed Days: 27
Plan Fractions Treated to Date: 4
Plan Prescribed Dose Per Fraction: 2 Gy
Plan Total Fractions Prescribed: 8
Plan Total Prescribed Dose: 16 Gy
Reference Point Dosage Given to Date: 8 Gy
Reference Point Session Dosage Given: 2 Gy
Session Number: 20

## 2024-08-11 ENCOUNTER — Other Ambulatory Visit: Payer: Self-pay

## 2024-08-11 ENCOUNTER — Ambulatory Visit
Admission: RE | Admit: 2024-08-11 | Discharge: 2024-08-11 | Disposition: A | Discharge status: Home / Self Care | Source: Ambulatory Visit | Attending: Radiation Oncology | Admitting: Radiation Oncology

## 2024-08-11 DIAGNOSIS — Z510 Encounter for antineoplastic radiation therapy: Secondary | ICD-10-CM | POA: Diagnosis not present

## 2024-08-11 LAB — RAD ONC ARIA SESSION SUMMARY
Course Elapsed Days: 28
Plan Fractions Treated to Date: 5
Plan Prescribed Dose Per Fraction: 2 Gy
Plan Total Fractions Prescribed: 8
Plan Total Prescribed Dose: 16 Gy
Reference Point Dosage Given to Date: 10 Gy
Reference Point Session Dosage Given: 2 Gy
Session Number: 21

## 2024-08-12 ENCOUNTER — Other Ambulatory Visit: Payer: Self-pay

## 2024-08-12 ENCOUNTER — Ambulatory Visit
Admission: RE | Admit: 2024-08-12 | Discharge: 2024-08-12 | Disposition: A | Discharge status: Home / Self Care | Source: Ambulatory Visit | Attending: Radiation Oncology | Admitting: Radiation Oncology

## 2024-08-12 DIAGNOSIS — Z510 Encounter for antineoplastic radiation therapy: Secondary | ICD-10-CM | POA: Diagnosis not present

## 2024-08-12 LAB — RAD ONC ARIA SESSION SUMMARY
Course Elapsed Days: 29
Plan Fractions Treated to Date: 6
Plan Prescribed Dose Per Fraction: 2 Gy
Plan Total Fractions Prescribed: 8
Plan Total Prescribed Dose: 16 Gy
Reference Point Dosage Given to Date: 12 Gy
Reference Point Session Dosage Given: 2 Gy
Session Number: 22

## 2024-08-13 ENCOUNTER — Other Ambulatory Visit: Payer: Self-pay

## 2024-08-13 ENCOUNTER — Ambulatory Visit
Admission: RE | Admit: 2024-08-13 | Discharge: 2024-08-13 | Disposition: A | Discharge status: Home / Self Care | Source: Ambulatory Visit | Attending: Radiation Oncology | Admitting: Radiation Oncology

## 2024-08-13 DIAGNOSIS — Z510 Encounter for antineoplastic radiation therapy: Secondary | ICD-10-CM | POA: Diagnosis not present

## 2024-08-13 LAB — RAD ONC ARIA SESSION SUMMARY
Course Elapsed Days: 30
Plan Fractions Treated to Date: 7
Plan Prescribed Dose Per Fraction: 2 Gy
Plan Total Fractions Prescribed: 8
Plan Total Prescribed Dose: 16 Gy
Reference Point Dosage Given to Date: 14 Gy
Reference Point Session Dosage Given: 2 Gy
Session Number: 23

## 2024-08-17 ENCOUNTER — Ambulatory Visit
Admission: RE | Admit: 2024-08-17 | Discharge: 2024-08-17 | Disposition: A | Discharge status: Home / Self Care | Source: Ambulatory Visit | Attending: Radiation Oncology | Admitting: Radiation Oncology

## 2024-08-17 ENCOUNTER — Other Ambulatory Visit: Payer: Self-pay

## 2024-08-17 DIAGNOSIS — C50912 Malignant neoplasm of unspecified site of left female breast: Secondary | ICD-10-CM | POA: Insufficient documentation

## 2024-08-17 LAB — RAD ONC ARIA SESSION SUMMARY
Course Elapsed Days: 34
Plan Fractions Treated to Date: 8
Plan Prescribed Dose Per Fraction: 2 Gy
Plan Total Fractions Prescribed: 8
Plan Total Prescribed Dose: 16 Gy
Reference Point Dosage Given to Date: 16 Gy
Reference Point Session Dosage Given: 2 Gy
Session Number: 24

## 2024-08-18 NOTE — Radiation Completion Notes (Signed)
 Patient Name: Zoe [name], Zoe [name] MRN: [id number] Date of Birth: 1966-07-14 Referring Physician: JEOFFREY POLLEN, M.D. Date of Service: 2024-08-18 Radiation Oncologist: Marcey Penton, M.D. [facility] [place] [facility]                             RADIATION ONCOLOGY END OF TREATMENT NOTE     Diagnosis: C50.512 Malignant neoplasm of lower-outer quadrant of left female breast Staging on 2024-04-07: Invasive ductal carcinoma of breast, female, left (HCC) T=pT1c, N=pN0, M=cM0 Staging on 2024-03-08: Invasive ductal carcinoma of breast, female, left (HCC) T=cT1b, N=cN0, M=cM0 Intent: Curative     HPI: Patient was originally consulted back in April 2025 for stage Ia ER positive invasive mammary carcinoma..  At that time Oncotype DX score was 28 she is undergone adjuvant chemotherapy with 4 cycles of TC which she is tolerated well except for some hair loss.  She is seen today to commence with her radiation treatment plan.  She is doing well specifically denies breast tenderness cough or bone pain.      ==========DELIVERED PLANS==========  First Treatment Date: 2024-07-14 Last Treatment Date: 2024-08-17   Plan Name: Breast_L_BH Site: Breast, Left Technique: 3D Mode: Photon Dose Per Fraction: 2.66 Gy Prescribed Dose (Delivered / Prescribed): 42.56 Gy / 42.56 Gy Prescribed Fxs (Delivered / Prescribed): 16 / 16   Plan Name: Breast_L_Bst Site: Breast, Left Technique: Electron Mode: Electron Dose Per Fraction: 2 Gy Prescribed Dose (Delivered / Prescribed): 16 Gy / 16 Gy Prescribed Fxs (Delivered / Prescribed): 8 / 8     ==========ON TREATMENT VISIT DATES========== 2024-07-20, 2024-07-27, 2024-08-03, 2024-08-10, 2024-08-17     ==========UPCOMING VISITS========== 11/15/2024 [facility] OFFICE VISIT Scoggins, Amber, NP  09/30/2024 [facility] SELMA Georgina Shasta MARLA, RN  09/17/2024 [facility] OFFICE VISIT Fernand Denyse LABOR,  MD  09/15/2024 [facility] ONCOLOGY FOLLOW UP 30 Penton Marcey, MD  09/10/2024 [facility] PCV ECHO 45 [facility] 1  09/06/2024 [facility] EST PT Babara Call, MD        ==========APPENDIX - ON TREATMENT VISIT NOTES==========   See weekly On Treatment Notes in Epic for details in the Media tab (listed as Progress notes on the On Treatment Visit Dates listed above).

## 2024-08-29 ENCOUNTER — Other Ambulatory Visit: Payer: Self-pay | Admitting: Cardiovascular Disease

## 2024-08-29 DIAGNOSIS — I2510 Atherosclerotic heart disease of native coronary artery without angina pectoris: Secondary | ICD-10-CM

## 2024-09-06 ENCOUNTER — Inpatient Hospital Stay: Attending: Oncology | Admitting: Oncology

## 2024-09-06 ENCOUNTER — Encounter: Payer: Self-pay | Admitting: Oncology

## 2024-09-06 VITALS — BP 116/76 | HR 66 | Temp 98.6°F | Resp 19 | Wt 154.4 lb

## 2024-09-06 DIAGNOSIS — C50912 Malignant neoplasm of unspecified site of left female breast: Secondary | ICD-10-CM

## 2024-09-06 DIAGNOSIS — C50312 Malignant neoplasm of lower-inner quadrant of left female breast: Secondary | ICD-10-CM | POA: Diagnosis present

## 2024-09-06 DIAGNOSIS — Z170 Estrogen receptor positive status [ER+]: Secondary | ICD-10-CM | POA: Insufficient documentation

## 2024-09-06 DIAGNOSIS — Z79811 Long term (current) use of aromatase inhibitors: Secondary | ICD-10-CM | POA: Insufficient documentation

## 2024-09-06 MED ORDER — ANASTROZOLE 1 MG PO TABS: 1.0000 mg | ORAL_TABLET | Freq: Every day | ORAL | 3 refills | Status: DC

## 2024-09-06 NOTE — Assessment & Plan Note (Signed)
 Recommend patient to take calcium  and vitamin D supplementation.  Obtain baseline DEXA

## 2024-09-06 NOTE — Progress Notes (Signed)
 Hematology/Oncology Progress note [phone] [phone]      CHIEF COMPLAINTS/PURPOSE OF CONSULTATION:  Left breast cancer  ASSESSMENT & PLAN:   Cancer Staging  Invasive ductal carcinoma of breast, female, left (HCC) Staging form: Breast, AJCC 8th Edition - Clinical stage from 03/08/2024: Stage IA (cT1b, cN0, cM0, G2, ER+, PR+, HER2-) - Signed by Babara Call, MD on 03/08/2024 - Pathologic stage from 04/07/2024: Stage IA (pT1c, pN0, cM0, G3, ER+, PR+, HER2-, Oncotype DX score: 28) - Signed by Babara Call, MD on 04/07/2024   Invasive ductal carcinoma of breast, female, left (HCC) Left breast invasive ductal carcinoma, pT1b cN0 ER 99%+/PR 30%+, HER2 low (1+), Oncotype DX RS 28, >15% chemotherapy benefit.  Status post adjuvant chemotherapy with TC Q3 weeks x 4 cycles.  Status post adjuvant breast radiation. Recommend adjuvant endocrine therapy.  She is menopause with FSH over 70.  Estradiol  level 14. Rationale of using aromatase inhibitor -Arimidex   discussed with patient.  Side effects of Arimidex  including but not limited to hot flash, muscle and joint pain, fatigue, mood swing, vaginal dryness/inching, loss of bone mineral density,hair thinning, heart problem, etc were discussed with patient.  Patient voices understanding and willing to proceed.   Obtain baseline DEXA  Aromatase inhibitor use Recommend patient to take calcium  and vitamin D supplementation.  Obtain baseline DEXA   Orders Placed This Encounter  Procedures   DG Bone Density    Standing Status:   Future    Expected Date:   09/20/2024    Expiration Date:   09/06/2025    Reason for Exam (SYMPTOM  OR DIAGNOSIS REQUIRED):   breast cancer    Is patient pregnant?:   No    Preferred imaging location?:    [facility]   CBC with Differential (Cancer Center Only)    Standing Status:   Future    Expected Date:   12/06/2024    Expiration Date:   03/06/2025   CMP (Cancer Center only)    Standing Status:    Future    Expected Date:   12/06/2024    Expiration Date:   03/06/2025   Follow up 3 months  All questions were answered. The patient knows to call the clinic with any problems, questions or concerns.  We spent sufficient time to discuss many aspect of care, questions were answered to patient's satisfaction. A total of 25 minutes was spent on this visit.  20 minutes counseling the patient on the patient Potential side effects of adjuvant endocrine therapy.  Additional 5 minutes was spent on answering patient's questions.    Call Babara, MD, PhD [facility] Hematology Oncology 09/06/2024    HISTORY OF PRESENTING ILLNESS:  Zoe [name] 58 y.o. female presents to establish care for left breast cancer I have reviewed her chart and materials related to her cancer extensively and collaborated history with the patient. Summary of oncologic history is as follows: Oncology History  Invasive ductal carcinoma of breast, female, left (HCC)  02/17/2024 Mammogram   Bilateral screening mammogram showed  FINDINGS: In the left breast, a possible mass warrants further evaluation. In the right breast, no findings suspicious for malignancy.   IMPRESSION: Further evaluation is suggested for a possible mass in the left breast.    02/24/2024 Mammogram   Unilateral left breast diagnostic mammogram  1. There is an 8 mm mass at the site of screening mammographic concern which is highly suspicious for malignancy. Recommend ultrasound-guided biopsy for definitive characterization. 2. No suspicious LEFT axillary adenopathy.  03/08/2024 Initial Diagnosis   Invasive ductal carcinoma of breast, female, left   Patient underwent left breast mass biopsy Pathology showed  1. Breast, left, needle core biopsy, 8 o'clock, 8cmfn :       - INVASIVE DUCTAL CARCINOMA, GRADE 2, EXTENDING TO 0.6 CM IN MAXIMUM EXTENT, AND       INVOLVING TWO OF TWO BIOPSY FRAGMENTS.  SEE COMMENT.       - TUBULE FORMATION: SCORE 3        - NUCLEAR PLEOMORPHISM: SCORE 2       - MITOTIC COUNT: SCORE 1       - TOTAL SCORE: 6       - OVERALL GRADE:  2       - LYMPHOVASCULAR INVASION: NOT IDENTIFIED       - CANCER LENGTH: 6 MM       - CALCIFICATIONS: NOT IDENTIFIED       - LYMPHOVASCULAR INVASION: NOT IDENTIFIED.   ER + 99% PR + 30%, Her2  negative (1+)    Menarche at age of 36 First live birth at age of 52 OCP use: >5 years, then switched to IUD History of hysterectomy: no Menopausal status: unsure History of HRT use: No History of chest radiation: no  Number of previous breast biopsies:  no  Denies family history of cancer, except that her father's cousin has breast cancer.    03/08/2024 Cancer Staging   Staging form: Breast, AJCC 8th Edition - Clinical stage from 03/08/2024: Stage IA (cT1b, cN0, cM0, G2, ER+, PR+, HER2-) - Signed by Babara Call, MD on 03/08/2024 Stage prefix: Initial diagnosis Histologic grading system: 3 grade system   03/17/2024 Surgery   Patient underwent left breast lumpectomy and SLNB  1. Breast, lumpectomy, left breast mass :       - INVASIVE MAMMARY CARCINOMA OF NO SPECIAL TYPE (DUCTAL).       - DUCTAL CARCINOMA IN SITU (DCIS).       - SEE CANCER SUMMARY BELOW.       - PRIOR BIOPSY SITE CHANGE WITH CLIP.       - SCOUT TAG IS PRESENT.        2. Lymph node, sentinel, biopsy, left axillary sentinel lymph node #1 :       - ONE LYMPH NODE NEGATIVE FOR MALIGNANCY (0/1).        3. Lymph node, sentinel, biopsy, left axillary sentinel lymph node #2 :       - ONE LYMPH NODE NEGATIVE FOR MALIGNANCY (0/1).    TUMOR Histologic Type: Invasive carcinoma of no special type (ductal) Histologic Grade (Nottingham Histologic Score) Glandular (Acinar)/Tubular Differentiation: 3 Nuclear Pleomorphism: 2 Mitotic Rate: 3 Overall Grade: 3 Tumor Size: Greatest dimension of largest invasive focus: 12 mm Ductal Carcinoma In Situ (DCIS): Present, intermediate to high-grade Tumor Extent: Not  applicable Lymphatic and/or Vascular Invasion: Not identified Treatment Effect in the Breast: No known presurgical therapy  MARGINS Margin Status for Invasive Carcinoma: All margins negative for invasive carcinoma Distance from closest margin: 10 mm Specify closest margin: Anterior Margin Status for DCIS: All margins negative for DCIS Distance from DCIS to closest margin: 0.1 mm Specify closest margin: Anterior                      REGIONAL LYMPH NODES Regional Lymph Node Status: All regional lymph nodes negative for tumor Total Number of Lymph Nodes Examined (sentinel and non-sentinel): 2 Number of Sentinel Nodes Examined: 2  PATHOLOGIC STAGE CLASSIFICATION (pTNM, AJCC 8th Edition):  Modified Classification: Not applicable  pT Category: pT1c  T Suffix: Not applicable  pN Category: pN0  N Suffix: (sn)  pM Category: Not applicable     04/07/2024 Cancer Staging   Staging form: Breast, AJCC 8th Edition - Pathologic stage from 04/07/2024: Stage IA (pT1c, pN0, cM0, G3, ER+, PR+, HER2-, Oncotype DX score: 28) - Signed by Babara Call, MD on 04/07/2024 Stage prefix: Initial diagnosis Multigene prognostic tests performed: Oncotype DX Recurrence score range: Greater than or equal to 11 Histologic grading system: 3 grade system   04/21/2024 -  Chemotherapy   Patient is on Treatment Plan : BREAST TC q21d      Genetic Testing   Negative genetic testing. No pathogenic variants identified on the [employer]. The report date is 05/24/2024.  The [employer] [facility] Panel includes sequencing, rearrangement analysis, and RNA analysis for the following 40 genes: APC, ATM, BAP1, BARD1, BMPR1A, BRCA1, BRCA2, BRIP1, CDH1, CDKN2A, CHEK2, FH, FLCN, MET, MLH1, MSH2, MSH6, MUTYH, NF1, NTHL1, PALB2, PMS2, PTEN, RAD51C, RAD51D, RPS20, SMAD4, STK11, TP53, TSC1, TSC2, and VHL (sequencing and deletion/duplication); AXIN2, HOXB13, MBD4, MSH3, POLD1 and POLE (sequencing only); EPCAM and GREM1  (deletion/duplication only).   07/14/2024 - 08/17/2024 Radiation Therapy   Patient's status post adjuvant breast radiation.    Patient reports feeling well today.  She presents to discuss adjuvant endocrine therapy.  MEDICAL HISTORY:  Past Medical History:  Diagnosis Date   Allergic rhinitis    Asthma    Complication of anesthesia    a.) delayed/prolonged emergence   Coronary artery disease    Dysplastic nevi 08/30/2020   Right lateral distal deltoid, left lower back post waistline. 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   T2DM (type 2 diabetes mellitus) (HCC)    Unstable angina (HCC)     SURGICAL HISTORY: Past Surgical History:  Procedure Laterality Date   AXILLARY SENTINEL NODE BIOPSY Left 03/17/2024   Procedure: BIOPSY, LYMPH NODE, SENTINEL, AXILLARY;  Surgeon: Rodolph Romano, MD;  Location: [facility];  Service: General;  Laterality: Left;   BREAST BIOPSY Left 03/03/2024   US  LT BREAST Bx venus marker,   8:00 8 cmfn : - INVASIVE DUCTAL CARCINOMA   BREAST BIOPSY Left 03/11/2024   US  LT RADIO FREQUENCY TAG LOC US  GUIDE 03/11/2024 [facility]   BREAST LUMPECTOMY Left 03/17/2024   Procedure: BREAST LUMPECTOMY;  Surgeon: Rodolph Romano, MD;  Location: [facility];  Service: General;  Laterality: Left;  w/ RF tag   CARDIAC CATHETERIZATION     COLONOSCOPY  2001   COLONOSCOPY WITH PROPOFOL  N/A 11/05/2019    Procedure: COLONOSCOPY WITH PROPOFOL ;  Surgeon: Janalyn Keene NOVAK, MD;  Location: [facility];  Service: Endoscopy;  Laterality: N/A;   CORONARY ANGIOPLASTY     DILATATION & CURETTAGE/HYSTEROSCOPY WITH MYOSURE N/A 02/29/2016   Procedure: DILATATION & CURETTAGE/HYSTEROSCOPY WITH MYOSURE;  Surgeon:  Mitzie BROCKS Ward, MD;  Location: [facility];  Service: Gynecology;  Laterality: N/A;   DILATION AND CURETTAGE OF UTERUS     EAR CYST EXCISION Left 2015   IN OFFICE   ESOPHAGOGASTRODUODENOSCOPY     INTRAUTERINE DEVICE (IUD) INSERTION N/A 02/29/2016   Procedure: INTRAUTERINE DEVICE (IUD) INSERTION;  Surgeon: Mitzie BROCKS Ward, MD;  Location: [facility];  Service: Gynecology;  Laterality: N/A;   LEFT HEART CATH AND CORONARY ANGIOGRAPHY Left 03/16/2019   Procedure: LEFT HEART CATH AND CORONARY ANGIOGRAPHY;  Surgeon: Fernand Denyse LABOR, MD;  Location: [facility];  Service: Cardiovascular;  Laterality: Left;    SOCIAL HISTORY: Social History   Socioeconomic History   Marital status: Married    Spouse name: Lehrman,Bobby T (Spouse)   Number of children: 2   Years of education: 18   Highest education level: Not on file  Occupational History   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  Other Topics Concern   Not on file  Social History Narrative   Not on file   Social Drivers of Health   Financial Resource Strain: Low Risk  (03/09/2024)   Received from [facility]   Overall Financial Resource Strain (CARDIA)    Difficulty of Paying Living Expenses: Not hard at all  Food Insecurity: No Food  Insecurity (03/09/2024)   Received from [facility]   Hunger Vital Sign    Within the past 12 months, you worried that your food would run out before you got the money to buy more.: Never true    Within the past 12 months, the food you bought just didn't last and you didn't have money to get more.: Never true  Transportation Needs: No Transportation Needs (03/09/2024)   Received from [facility] - Transportation    In the past 12 months, has lack of transportation kept you from medical appointments or from getting medications?: No    Lack of Transportation (Non-Medical): No  Physical Activity: Not on file  Stress: Not on file  Social Connections: Not on file  Intimate Partner Violence: Not At Risk (03/08/2024)   Humiliation, Afraid, Rape, and Kick questionnaire    Fear of Current or Ex-Partner: No    Emotionally Abused: No    Physically Abused: No    Sexually Abused: No    FAMILY HISTORY: Family History  Problem Relation Age of Onset   Hyperlipidemia Mother    Hypertension Mother    Heart disease Father        died from either MI or stroke   Diabetes Brother        TYPE 2   Heart disease Paternal Grandfather    Diabetes Other        TYPE 2   Cancer Neg Hx    Breast cancer Neg Hx     ALLERGIES:  is allergic to ace inhibitors and neosporin wound cleanser [benzalkonium chloride].  MEDICATIONS:  Current Outpatient Medications  Medication  Sig Dispense Refill   anastrozole  (ARIMIDEX ) 1 MG tablet Take 1 tablet (1 mg total) by mouth daily. 30 tablet 3   aspirin  81 MG tablet Take 81 mg by mouth daily.     Cholecalciferol (VITAMIN D-3 PO) Take 2,000 Units by mouth daily.     clopidogrel  (PLAVIX ) 75 MG tablet TAKE 1 TABLET BY MOUTH EVERY DAY 90 tablet 1   Coenzyme Q10 (CO Q 10 PO) Take 1 capsule by mouth daily.     FARXIGA 10 MG TABS tablet TAKE 1 TABLET BY MOUTH EVERY DAY IN THE MORNING 30 tablet 11   ibuprofen  (ADVIL ,MOTRIN ) 600 MG  tablet Take 1 tablet (600 mg total) by mouth every 6 (six) hours as needed. 30 tablet 0   isosorbide  mononitrate (IMDUR ) 30 MG 24 hr tablet TAKE 1 TABLET BY MOUTH EVERY DAY 90 tablet 2   losartan-hydrochlorothiazide  (HYZAAR) 50-12.5 MG tablet TAKE 1 TABLET BY MOUTH EVERY DAY 90 tablet 0   metFORMIN  (GLUCOPHAGE ) 500 MG tablet TAKE 1 TABLET BY MOUTH 2 TIMES DAILY WITH A MEAL. 180 tablet 1   metoprolol  succinate (TOPROL -XL) 50 MG 24 hr tablet TAKE 1 TABLET BY MOUTH EVERY DAY 90 tablet 2   Omega-3 Fatty Acids (FISH OIL) 1200 MG CAPS Take 1,200 mg by mouth daily.     pantoprazole  (PROTONIX ) 40 MG tablet TAKE 1 TABLET BY MOUTH EVERY DAY 90 tablet 1   rosuvastatin  (CRESTOR ) 40 MG tablet TAKE 1 TABLET BY MOUTH EVERY DAY 90 tablet 1   No current facility-administered medications for this visit.    Review of Systems  Constitutional:  Positive for fatigue. Negative for appetite change, chills and fever.  HENT:   Negative for hearing loss and voice change.   Eyes:  Negative for eye problems.  Respiratory:  Negative for chest tightness and cough.   Cardiovascular:  Negative for chest pain.  Gastrointestinal:  Negative for abdominal distention, abdominal pain and blood in stool.  Endocrine: Negative for hot flashes.  Genitourinary:  Negative for difficulty urinating and frequency.   Musculoskeletal:  Negative for arthralgias.  Skin:  Negative for itching and rash.  Neurological:  Negative for extremity weakness.  Hematological:  Negative for adenopathy.  Psychiatric/Behavioral:  Negative for confusion.      PHYSICAL EXAMINATION: ECOG PERFORMANCE STATUS: 0 - Asymptomatic  Vitals:   09/06/24 1050  BP: 116/76  Pulse: 66  Resp: 19  Temp: 98.6 F (37 C)  SpO2: 97%   Filed Weights   09/06/24 1050  Weight: 154 lb 6.4 oz (70 kg)    Physical Exam Constitutional:      General: She is not in acute distress.    Appearance: She is not diaphoretic.  HENT:     Head: Normocephalic and  atraumatic.     Nose: Nose normal.     Mouth/Throat:     Pharynx: No oropharyngeal exudate.  Eyes:     General: No scleral icterus.    Pupils: Pupils are equal, round, and reactive to light.  Cardiovascular:     Rate and Rhythm: Normal rate and regular rhythm.     Heart sounds: No murmur heard. Pulmonary:     Effort: Pulmonary effort is normal. No respiratory distress.     Breath sounds: Normal breath sounds. No wheezing.  Abdominal:     General: There is no distension.     Palpations: Abdomen is soft.     Tenderness: There is no abdominal tenderness.  Musculoskeletal:  General: Normal range of motion.     Cervical back: Normal range of motion and neck supple.  Skin:    General: Skin is warm and dry.     Findings: No erythema.  Neurological:     Mental Status: She is alert and oriented to person, place, and time. Mental status is at baseline.     Motor: No abnormal muscle tone.  Psychiatric:        Mood and Affect: Mood and affect normal.     LABORATORY DATA:  I have reviewed the data as listed    Latest Ref Rng & Units 08/04/2024   10:40 AM 07/28/2024   10:01 AM 06/23/2024   10:41 AM  CBC  WBC 4.0 - 10.5 K/uL 3.9  5.7  14.4   Hemoglobin 12.0 - 15.0 g/dL 87.9  87.8  88.2   Hematocrit 36.0 - 46.0 % 36.6  37.1  35.6   Platelets 150 - 400 K/uL 175  270  208       Latest Ref Rng & Units 07/06/2024    9:02 AM 06/23/2024   10:41 AM 06/02/2024    8:22 AM  CMP  Glucose 70 - 99 mg/dL 881  813  816   BUN 6 - 24 mg/dL 11  20  22    Creatinine 0.57 - 1.00 mg/dL 9.48  9.44  9.49   Sodium 134 - 144 mmol/L 145  135  136   Potassium 3.5 - 5.2 mmol/L 3.9  3.7  3.7   Chloride 96 - 106 mmol/L 107  104  104   CO2 20 - 29 mmol/L 18  20  21    Calcium  8.7 - 10.2 mg/dL 9.1  9.1  9.5   Total Protein 6.0 - 8.5 g/dL 6.1  7.2  7.5   Total Bilirubin 0.0 - 1.2 mg/dL 0.5  0.8  1.3   Alkaline Phos 44 - 121 IU/L 130  66  69   AST 0 - 40 IU/L 16  22  19    ALT 0 - 32 IU/L 15  21  28        RADIOGRAPHIC STUDIES: I have personally reviewed the radiological images as listed and agreed with the findings in the report. No results found.

## 2024-09-06 NOTE — Assessment & Plan Note (Addendum)
 Left breast invasive ductal carcinoma, pT1b cN0 ER 99%+/PR 30%+, HER2 low (1+), Oncotype DX RS 28, >15% chemotherapy benefit.  Status post adjuvant chemotherapy with TC Q3 weeks x 4 cycles.  Status post adjuvant breast radiation. Recommend adjuvant endocrine therapy.  She is menopause with FSH over 70.  Estradiol  level 14. Rationale of using aromatase inhibitor -Arimidex   discussed with patient.  Side effects of Arimidex  including but not limited to hot flash, muscle and joint pain, fatigue, mood swing, vaginal dryness/inching, loss of bone mineral density,hair thinning, heart problem, etc were discussed with patient.  Patient voices understanding and willing to proceed.   Obtain baseline DEXA

## 2024-09-10 ENCOUNTER — Ambulatory Visit

## 2024-09-10 DIAGNOSIS — I10 Essential (primary) hypertension: Secondary | ICD-10-CM

## 2024-09-10 DIAGNOSIS — I214 Non-ST elevation (NSTEMI) myocardial infarction: Secondary | ICD-10-CM

## 2024-09-10 DIAGNOSIS — I340 Nonrheumatic mitral (valve) insufficiency: Secondary | ICD-10-CM | POA: Diagnosis not present

## 2024-09-10 DIAGNOSIS — I361 Nonrheumatic tricuspid (valve) insufficiency: Secondary | ICD-10-CM | POA: Diagnosis not present

## 2024-09-10 DIAGNOSIS — E782 Mixed hyperlipidemia: Secondary | ICD-10-CM

## 2024-09-10 DIAGNOSIS — I2510 Atherosclerotic heart disease of native coronary artery without angina pectoris: Secondary | ICD-10-CM

## 2024-09-10 DIAGNOSIS — C50912 Malignant neoplasm of unspecified site of left female breast: Secondary | ICD-10-CM

## 2024-09-11 ENCOUNTER — Other Ambulatory Visit: Payer: Self-pay | Admitting: Cardiovascular Disease

## 2024-09-11 DIAGNOSIS — R002 Palpitations: Secondary | ICD-10-CM

## 2024-09-11 DIAGNOSIS — R079 Chest pain, unspecified: Secondary | ICD-10-CM

## 2024-09-15 ENCOUNTER — Ambulatory Visit
Admission: RE | Admit: 2024-09-15 | Discharge: 2024-09-15 | Disposition: A | Discharge status: Home / Self Care | Source: Ambulatory Visit | Attending: Radiation Oncology | Admitting: Radiation Oncology

## 2024-09-15 VITALS — BP 127/85 | HR 65 | Resp 18 | Wt 155.0 lb

## 2024-09-15 DIAGNOSIS — C50912 Malignant neoplasm of unspecified site of left female breast: Secondary | ICD-10-CM

## 2024-09-15 DIAGNOSIS — Z170 Estrogen receptor positive status [ER+]: Secondary | ICD-10-CM | POA: Insufficient documentation

## 2024-09-15 NOTE — Progress Notes (Signed)
 Survivorship Care Plan visit completed.  Treatment summary reviewed and given to patient.  ASCO answers booklet reviewed and given to patient.  CARE program and Cancer Transitions discussed with patient along with other resources cancer center offers to patients and caregivers.  Patient verbalized understanding.

## 2024-09-15 NOTE — Progress Notes (Signed)
 Radiation Oncology Follow up Note  Name: Zoe [name]   Date:   09/15/2024 MRN:  [id number] DOB: June 14, 1966    This 58 y.o. female presents to the clinic today for 1 month follow-up status post whole breast radiation to her left breast for stage Ia (T1c N0 M0) ER/PR positive invasive mammary carcinoma.  REFERRING PROVIDER: Carin Gauze, NP  HPI: Patient is a 58 year old female now out 1 month having completed whole breast radiation to her left breast for stage Ia invasive mammary carcinoma ER positive.  Seen today in routine follow-up she is doing well.  She specifically denies breast tenderness cough or bone pain.  She still has some slight hyperpigmentation of the skin.  She has been started on.  Arimidex  tolerating that well without side effect  COMPLICATIONS OF TREATMENT: none  FOLLOW UP COMPLIANCE: keeps appointments   PHYSICAL EXAM:  BP 127/85   Pulse 65   Resp 18   Wt 155 lb (70.3 kg)   SpO2 97%   BMI 26.61 kg/m  Lungs are clear to A&P cardiac examination essentially unremarkable with regular rate and rhythm. No dominant mass or nodularity is noted in either breast in 2 positions examined. Incision is well-healed. No axillary or supraclavicular adenopathy is appreciated. Cosmetic result is excellent.  Some slight retraction of the breast up from her scar although cosmetic result is still good to excellent.  RADIOLOGY RESULTS: No current films to review  PLAN: Present time patient is doing well with no evidence of disease 1 month out.  I am pleased with her overall progress she is tolerating endocrine therapy well.  Patient knows to call with any concerns.  We will see her back in 6 months for follow-up.  I would like to take this opportunity to thank you for allowing me to participate in the care of your patient.SABRA Marcey Penton, MD

## 2024-09-17 ENCOUNTER — Ambulatory Visit: Admitting: Cardiovascular Disease

## 2024-09-17 ENCOUNTER — Encounter: Payer: Self-pay | Admitting: Cardiovascular Disease

## 2024-09-17 VITALS — BP 108/70 | HR 68 | Ht 64.0 in | Wt 156.8 lb

## 2024-09-17 DIAGNOSIS — Z131 Encounter for screening for diabetes mellitus: Secondary | ICD-10-CM

## 2024-09-17 DIAGNOSIS — I2510 Atherosclerotic heart disease of native coronary artery without angina pectoris: Secondary | ICD-10-CM

## 2024-09-17 DIAGNOSIS — I10 Essential (primary) hypertension: Secondary | ICD-10-CM | POA: Diagnosis not present

## 2024-09-17 DIAGNOSIS — I340 Nonrheumatic mitral (valve) insufficiency: Secondary | ICD-10-CM

## 2024-09-17 DIAGNOSIS — I214 Non-ST elevation (NSTEMI) myocardial infarction: Secondary | ICD-10-CM

## 2024-09-17 NOTE — Progress Notes (Signed)
 Cardiology Office Note   Date:  09/17/2024   ID:  Zoe [name], DOB [birth year], MRN [id number]  PCP:  Carin Gauze, NP  Cardiologist:  Denyse Bathe, MD      History of Present Illness: Zoe [name] is a 58 y.o. female who presents for  Chief Complaint  Patient presents with   Follow-up    6 week echo results    No chest pain or SOB.      Past Medical History:  Diagnosis Date   Allergic rhinitis    Asthma    Complication of anesthesia    a.) delayed/prolonged emergence   Coronary artery disease    Dysplastic nevi 08/30/2020   Right lateral distal deltoid, left lower back post waistline. 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   T2DM (type 2 diabetes mellitus) (HCC)    Unstable angina (HCC)      Past Surgical History:  Procedure Laterality Date   AXILLARY SENTINEL NODE BIOPSY Left 03/17/2024   Procedure: BIOPSY, LYMPH NODE, SENTINEL, AXILLARY;  Surgeon: Rodolph Romano, MD;  Location: [facility];  Service: General;  Laterality: Left;   BREAST BIOPSY Left 03/03/2024   US  LT BREAST Bx venus marker,   8:00 8 cmfn : - INVASIVE DUCTAL CARCINOMA   BREAST BIOPSY Left 03/11/2024   US  LT RADIO FREQUENCY TAG LOC US  GUIDE 03/11/2024 [facility]   BREAST LUMPECTOMY Left 03/17/2024   Procedure: BREAST LUMPECTOMY;  Surgeon: Rodolph Romano, MD;  Location: [facility];  Service: General;   Laterality: Left;  w/ RF tag   CARDIAC CATHETERIZATION     COLONOSCOPY  2001   COLONOSCOPY WITH PROPOFOL  N/A 11/05/2019   Procedure: COLONOSCOPY WITH PROPOFOL ;  Surgeon: Janalyn Keene NOVAK, MD;  Location: [facility];  Service: Endoscopy;  Laterality: N/A;   CORONARY ANGIOPLASTY     DILATATION & CURETTAGE/HYSTEROSCOPY WITH MYOSURE N/A 02/29/2016   Procedure: DILATATION & CURETTAGE/HYSTEROSCOPY WITH MYOSURE;  Surgeon: Mitzie BROCKS Ward, MD;  Location: [facility];  Service: Gynecology;  Laterality: N/A;   DILATION AND CURETTAGE OF UTERUS     EAR CYST EXCISION Left 2015   IN OFFICE   ESOPHAGOGASTRODUODENOSCOPY     INTRAUTERINE DEVICE (IUD) INSERTION N/A 02/29/2016   Procedure: INTRAUTERINE DEVICE (IUD) INSERTION;  Surgeon: Mitzie BROCKS Ward, MD;  Location: [facility];  Service: Gynecology;  Laterality: N/A;   LEFT HEART CATH AND CORONARY ANGIOGRAPHY Left 03/16/2019   Procedure: LEFT HEART CATH AND CORONARY ANGIOGRAPHY;  Surgeon: Bathe Denyse LABOR, MD;  Location: [facility];  Service: Cardiovascular;  Laterality: Left;     Current Outpatient Medications  Medication Sig Dispense Refill   anastrozole  (ARIMIDEX ) 1 MG tablet Take 1 tablet (1 mg total) by mouth daily. 30 tablet 3   aspirin  81 MG tablet Take 81 mg by mouth daily.     Cholecalciferol (VITAMIN D-3 PO) Take 2,000 Units by mouth daily.  clopidogrel  (PLAVIX ) 75 MG tablet TAKE 1 TABLET BY MOUTH EVERY DAY 90 tablet 1   Coenzyme Q10 (CO Q 10 PO) Take 1 capsule by mouth daily.     FARXIGA 10 MG TABS tablet TAKE 1 TABLET BY MOUTH EVERY DAY IN THE MORNING 30 tablet 11   ibuprofen  (ADVIL ,MOTRIN ) 600 MG tablet Take 1 tablet (600 mg total) by mouth every 6 (six) hours as needed. 30 tablet 0   isosorbide  mononitrate (IMDUR ) 30 MG 24 hr tablet TAKE 1 TABLET BY MOUTH EVERY DAY 90 tablet 2   losartan-hydrochlorothiazide  (HYZAAR) 50-12.5 MG tablet TAKE 1 TABLET BY MOUTH EVERY DAY 90 tablet 0   metFORMIN  (GLUCOPHAGE ) 500 MG tablet TAKE 1  TABLET BY MOUTH 2 TIMES DAILY WITH A MEAL. 180 tablet 1   metoprolol  succinate (TOPROL -XL) 50 MG 24 hr tablet TAKE 1 TABLET BY MOUTH EVERY DAY 90 tablet 2   Omega-3 Fatty Acids (FISH OIL) 1200 MG CAPS Take 1,200 mg by mouth daily.     pantoprazole  (PROTONIX ) 40 MG tablet TAKE 1 TABLET BY MOUTH EVERY DAY 90 tablet 1   rosuvastatin  (CRESTOR ) 40 MG tablet TAKE 1 TABLET BY MOUTH EVERY DAY 90 tablet 1   No current facility-administered medications for this visit.    Allergies:   Ace inhibitors and Neosporin wound cleanser [benzalkonium chloride]    Social History:   reports that she has never smoked. She has never used smokeless tobacco. She reports current alcohol use of about 3.0 - 5.0 standard drinks of alcohol per week. She reports that she does not use drugs.   Family History:  family history includes Diabetes in her brother and another family member; Heart disease in her father and paternal grandfather; Hyperlipidemia in her mother; Hypertension in her mother.    ROS:     Review of Systems  Constitutional: Negative.   HENT: Negative.    Eyes: Negative.   Respiratory: Negative.    Gastrointestinal: Negative.   Genitourinary: Negative.   Musculoskeletal: Negative.   Skin: Negative.   Neurological: Negative.   Endo/Heme/Allergies: Negative.   Psychiatric/Behavioral: Negative.    All other systems reviewed and are negative.     All other systems are reviewed and negative.    PHYSICAL EXAM: VS:  BP 108/70   Pulse 68   Ht 5' 4 (1.626 m)   Wt 156 lb 12.8 oz (71.1 kg)   SpO2 94%   BMI 26.91 kg/m  , BMI Body mass index is 26.91 kg/m. Last weight:  Wt Readings from Last 3 Encounters:  09/17/24 156 lb 12.8 oz (71.1 kg)  09/15/24 155 lb (70.3 kg)  09/06/24 154 lb 6.4 oz (70 kg)     Physical Exam Constitutional:      Appearance: Normal appearance.  Cardiovascular:     Rate and Rhythm: Normal rate and regular rhythm.     Heart sounds: Normal heart sounds.  Pulmonary:      Effort: Pulmonary effort is normal.     Breath sounds: Normal breath sounds.  Musculoskeletal:     Right lower leg: No edema.     Left lower leg: No edema.  Neurological:     Mental Status: She is alert.       EKG:   Recent Labs: 07/06/2024: ALT 15; BUN 11; Creatinine, Ser 0.51; Potassium 3.9; Sodium 145; TSH 1.250 08/04/2024: Hemoglobin 12.0; Platelet Count 175    Lipid Panel    Component Value Date/Time   CHOL 90 (L) 07/06/2024 0902  TRIG 198 (H) 07/06/2024 0902   HDL 21 (L) 07/06/2024 0902   CHOLHDL 4.3 07/06/2024 0902   LDLCALC 37 07/06/2024 0902      Other studies Reviewed: Additional studies/ records that were reviewed today include:  Review of the above records demonstrates:       No data to display            ASSESSMENT AND PLAN:    ICD-10-CM   1. Nonrheumatic mitral valve regurgitation  I34.0    Chemotherapy did not have any adverse eefect, trace MR/TR normal EF.    2. Coronary artery disease involving native coronary artery of native heart without angina pectoris  I25.10     3. Primary hypertension  I10     4. NSTEMI (non-ST elevated myocardial infarction) (HCC)  I21.4        Problem List Items Addressed This Visit       Cardiovascular and Mediastinum   Coronary artery disease involving native coronary artery of native heart without angina pectoris (Chronic)   Hypertension   NSTEMI (non-ST elevated myocardial infarction) (HCC)   Other Visit Diagnoses       Nonrheumatic mitral valve regurgitation    -  Primary   Chemotherapy did not have any adverse eefect, trace MR/TR normal EF.          Disposition:   No follow-ups on file.    Total time spent: 30 minutes  Signed,  Denyse Bathe, MD  09/17/2024 3:52 PM    [facility]

## 2024-09-28 ENCOUNTER — Encounter: Payer: Self-pay | Admitting: *Deleted

## 2024-09-30 ENCOUNTER — Inpatient Hospital Stay: Admitting: *Deleted

## 2024-10-11 ENCOUNTER — Inpatient Hospital Stay: Attending: Oncology | Admitting: *Deleted

## 2024-10-11 ENCOUNTER — Encounter: Payer: Self-pay | Admitting: *Deleted

## 2024-10-11 ENCOUNTER — Ambulatory Visit
Admission: RE | Admit: 2024-10-11 | Discharge: 2024-10-11 | Disposition: A | Discharge status: Home / Self Care | Source: Ambulatory Visit | Attending: Oncology | Admitting: Oncology

## 2024-10-11 DIAGNOSIS — Z9189 Other specified personal risk factors, not elsewhere classified: Secondary | ICD-10-CM

## 2024-10-11 DIAGNOSIS — C50912 Malignant neoplasm of unspecified site of left female breast: Secondary | ICD-10-CM | POA: Insufficient documentation

## 2024-10-11 NOTE — Progress Notes (Signed)
 SUBJECTIVE: Pt returns for her 3 month L-Dex screen.    PAIN:  Are you having pain? No   SOZO SCREENING: Patient was assessed today using the SOZO machine to determine the lymphedema index score. This was compared to her baseline score. It was determined that she is within the recommended range when compared to her baseline and no further action is needed at this time. She will continue SOZO screenings. These are done every 3 months for 2 years post operatively followed by every 6 months for 2 years, and then annually.     L-DEX FLOWSHEETS                L-DEX LYMPHEDEMA SCREENING    Measurement Type Unilateral     L-DEX MEASUREMENT EXTREMITY Upper Extremity     POSITION  Standing     DOMINANT SIDE Right     At Risk Side Left     BASELINE SCORE (UNILATERAL) 1.2    L-DEX SCORE (UNILATERAL) 0.4    VALUE CHANGE (UNILAT) -0.8

## 2024-11-08 ENCOUNTER — Ambulatory Visit: Admitting: Cardiology

## 2024-11-09 ENCOUNTER — Other Ambulatory Visit

## 2024-11-09 DIAGNOSIS — E1165 Type 2 diabetes mellitus with hyperglycemia: Secondary | ICD-10-CM

## 2024-11-09 DIAGNOSIS — I10 Essential (primary) hypertension: Secondary | ICD-10-CM

## 2024-11-09 DIAGNOSIS — E782 Mixed hyperlipidemia: Secondary | ICD-10-CM

## 2024-11-09 DIAGNOSIS — E7800 Pure hypercholesterolemia, unspecified: Secondary | ICD-10-CM

## 2024-11-10 ENCOUNTER — Ambulatory Visit: Payer: Self-pay | Admitting: Cardiology

## 2024-11-10 ENCOUNTER — Encounter: Payer: Self-pay | Admitting: Oncology

## 2024-11-10 LAB — CMP14+EGFR
ALT: 16 IU/L (ref 0–32)
AST: 14 IU/L (ref 0–40)
Albumin: 4.5 g/dL (ref 3.8–4.9)
Alkaline Phosphatase: 77 IU/L (ref 49–135)
BUN/Creatinine Ratio: 23 (ref 9–23)
BUN: 14 mg/dL (ref 6–24)
Bilirubin Total: 0.7 mg/dL (ref 0.0–1.2)
CO2: 20 mmol/L (ref 20–29)
Calcium: 9.7 mg/dL (ref 8.7–10.2)
Chloride: 103 mmol/L (ref 96–106)
Creatinine, Ser: 0.61 mg/dL (ref 0.57–1.00)
Globulin, Total: 1.9 g/dL (ref 1.5–4.5)
Glucose: 122 mg/dL — ABNORMAL HIGH (ref 70–99)
Potassium: 3.8 mmol/L (ref 3.5–5.2)
Sodium: 143 mmol/L (ref 134–144)
Total Protein: 6.4 g/dL (ref 6.0–8.5)
eGFR: 105 mL/min/1.73 (ref >59)

## 2024-11-10 LAB — LIPID PANEL
Chol/HDL Ratio: 3 ratio (ref 0.0–4.4)
Cholesterol, Total: 129 mg/dL (ref 100–199)
HDL: 43 mg/dL (ref >39)
LDL Chol Calc (NIH): 58 mg/dL (ref 0–99)
Triglycerides: 168 mg/dL — ABNORMAL HIGH (ref 0–149)
VLDL Cholesterol Cal: 28 mg/dL (ref 5–40)

## 2024-11-10 LAB — HEMOGLOBIN A1C
Est. average glucose Bld gHb Est-mCnc: 166 mg/dL
Hgb A1c MFr Bld: 7.4 % — ABNORMAL HIGH (ref 4.8–5.6)

## 2024-11-10 LAB — TSH: TSH: 1.36 u[IU]/mL (ref 0.450–4.500)

## 2024-11-15 ENCOUNTER — Ambulatory Visit: Admitting: Cardiology

## 2024-11-15 ENCOUNTER — Encounter: Payer: Self-pay | Admitting: Cardiology

## 2024-11-15 VITALS — BP 110/70 | HR 64 | Ht 64.0 in | Wt 150.7 lb

## 2024-11-15 DIAGNOSIS — E1165 Type 2 diabetes mellitus with hyperglycemia: Secondary | ICD-10-CM

## 2024-11-15 DIAGNOSIS — E782 Mixed hyperlipidemia: Secondary | ICD-10-CM

## 2024-11-15 DIAGNOSIS — E1159 Type 2 diabetes mellitus with other circulatory complications: Secondary | ICD-10-CM

## 2024-11-15 DIAGNOSIS — I152 Hypertension secondary to endocrine disorders: Secondary | ICD-10-CM | POA: Diagnosis not present

## 2024-11-15 DIAGNOSIS — E1169 Type 2 diabetes mellitus with other specified complication: Secondary | ICD-10-CM | POA: Insufficient documentation

## 2024-11-15 DIAGNOSIS — C50912 Malignant neoplasm of unspecified site of left female breast: Secondary | ICD-10-CM

## 2024-11-15 DIAGNOSIS — I2510 Atherosclerotic heart disease of native coronary artery without angina pectoris: Secondary | ICD-10-CM

## 2024-11-15 NOTE — Progress Notes (Unsigned)
 Established Patient Office Visit  Subjective:  Patient ID: Zoe [name], female    DOB: Feb 05, 1966  Age: 58 y.o. MRN: [id number]  Chief Complaint  Patient presents with   Follow-up    4 month follow up    Patient in office for 4 month follow up, discuss recent lab results. Patient doing well, no complaints today. Discussed recent lab work. Hgb A1c elevated, patient states she has not been eating well. Discussed cutting back on carbs and starches.  Due for urine micro, will do today. Heart cath 02/2019 Ost 1st Mrg to 1st Mrg lesion is 80% stenosed, treated medically.  Patient done with chemo and radiation for breast cancer.  Continue current medications.     No other concerns at this time.   Past Medical History:  Diagnosis Date   Allergic rhinitis    Asthma    Complication of anesthesia    a.) delayed/prolonged emergence   Coronary artery disease    Dysplastic nevi 08/30/2020   Right lateral distal deltoid, left lower back post waistline. 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   T2DM (type 2 diabetes mellitus) (HCC)    Unstable angina (HCC)     Past Surgical History:  Procedure Laterality Date   AXILLARY SENTINEL NODE BIOPSY Left 03/17/2024   Procedure: BIOPSY, LYMPH NODE, SENTINEL, AXILLARY;  Surgeon: Rodolph Romano, MD;  Location: [facility];  Service: General;  Laterality: Left;   BREAST  BIOPSY Left 03/03/2024   US  LT BREAST Bx venus marker,   8:00 8 cmfn : - INVASIVE DUCTAL CARCINOMA   BREAST BIOPSY Left 03/11/2024   US  LT RADIO FREQUENCY TAG LOC US  GUIDE 03/11/2024 [facility]   BREAST LUMPECTOMY Left 03/17/2024   Procedure: BREAST LUMPECTOMY;  Surgeon: Rodolph Romano, MD;  Location: [facility];  Service: General;  Laterality: Left;  w/ RF tag   CARDIAC CATHETERIZATION     COLONOSCOPY  2001   COLONOSCOPY WITH PROPOFOL  N/A 11/05/2019   Procedure: COLONOSCOPY WITH PROPOFOL ;  Surgeon: Janalyn Keene NOVAK, MD;  Location: [facility];  Service: Endoscopy;  Laterality: N/A;   CORONARY ANGIOPLASTY     DILATATION & CURETTAGE/HYSTEROSCOPY WITH MYOSURE N/A 02/29/2016   Procedure: DILATATION & CURETTAGE/HYSTEROSCOPY WITH MYOSURE;  Surgeon: Mitzie BROCKS Ward, MD;  Location: [facility];  Service: Gynecology;  Laterality: N/A;   DILATION AND CURETTAGE OF UTERUS     EAR CYST EXCISION Left 2015   IN OFFICE   ESOPHAGOGASTRODUODENOSCOPY     INTRAUTERINE DEVICE (IUD) INSERTION N/A 02/29/2016   Procedure: INTRAUTERINE DEVICE (IUD) INSERTION;  Surgeon: Mitzie BROCKS Ward, MD;  Location: [facility];  Service: Gynecology;  Laterality: N/A;   LEFT HEART CATH AND CORONARY ANGIOGRAPHY Left 03/16/2019   Procedure: LEFT HEART CATH AND CORONARY ANGIOGRAPHY;  Surgeon: Fernand Denyse LABOR, MD;  Location: [facility];  Service: Cardiovascular;  Laterality: Left;    Social History   Socioeconomic  History   Marital status: Married    Spouse name: North,Bobby T (Spouse)   Number of children: 2   Years of education: 18   Highest education level: Not on file  Occupational History   Occupation: Teacher    Comment: [employer] - [employer]  Tobacco Use   Smoking status: Never   Smokeless tobacco: Never  Vaping Use   Vaping status: Never Used  Substance and Sexual Activity   Alcohol use: Yes    Alcohol/week: 3.0 - 5.0 standard drinks of alcohol    Types: 3 - 5 Cans of beer per week    Comment:  occasionally   Drug use: No   Sexual activity: Yes    Partners: Male    Birth control/protection: I.U.D.  Other Topics Concern   Not on file  Social History Narrative   Not on file   Social Drivers of Health   Financial Resource Strain: Low Risk  (03/09/2024)   Received from [facility]   Overall Financial Resource Strain (CARDIA)    Difficulty of Paying Living Expenses: Not hard at all  Food Insecurity: No Food Insecurity (03/09/2024)   Received from [facility]   Hunger Vital Sign    Within the past 12 months, you worried that your food would run out before you got the money to buy more.: Never true    Within the past 12 months, the food you bought just didn't last and you didn't have money to get more.: Never true  Transportation Needs: No Transportation Needs (03/09/2024)   Received from [facility] - Transportation    In the past 12 months, has lack of transportation kept you from medical appointments or from getting medications?: No    Lack of Transportation (Non-Medical): No  Physical Activity: Not on file  Stress: Not on file  Social Connections: Not on file  Intimate Partner Violence: Not At Risk (03/08/2024)   Humiliation, Afraid, Rape, and Kick questionnaire    Fear of Current or Ex-Partner: No    Emotionally Abused: No    Physically Abused: No    Sexually Abused: No    Family History  Problem Relation Age of Onset   Hyperlipidemia Mother    Hypertension Mother    Heart disease Father        died from either MI or stroke   Diabetes Brother        TYPE 2   Heart disease Paternal Grandfather    Diabetes Other        TYPE 2   Cancer Neg Hx    Breast cancer Neg Hx     Allergies  Allergen Reactions   Ace Inhibitors Cough    Patient cannot recall the specific ACE inhibitor that caused her cough.   Neosporin Wound Cleanser [Benzalkonium Chloride] Rash and Other (See Comments)    Swelling / redness     Outpatient Medications Prior to Visit  Medication Sig   anastrozole  (ARIMIDEX ) 1 MG tablet Take 1 tablet (1 mg total) by mouth daily.   aspirin  81 MG tablet Take 81 mg by mouth daily.   Cholecalciferol (VITAMIN D-3 PO) Take 2,000 Units by mouth daily.   clopidogrel  (PLAVIX ) 75 MG tablet TAKE 1 TABLET BY MOUTH EVERY DAY   Coenzyme Q10 (CO Q 10 PO) Take 1 capsule by mouth daily.   FARXIGA 10 MG TABS tablet TAKE 1 TABLET BY MOUTH EVERY DAY IN THE MORNING  ibuprofen  (ADVIL ,MOTRIN ) 600 MG tablet Take 1 tablet (600 mg total) by mouth every 6 (six) hours as needed.   isosorbide  mononitrate (IMDUR ) 30 MG 24 hr tablet TAKE 1 TABLET BY MOUTH EVERY DAY   losartan-hydrochlorothiazide  (HYZAAR) 50-12.5 MG tablet TAKE 1 TABLET BY MOUTH EVERY DAY   metFORMIN  (GLUCOPHAGE ) 500 MG tablet TAKE 1 TABLET BY MOUTH 2 TIMES DAILY WITH A MEAL.   metoprolol  succinate (TOPROL -XL) 50 MG 24 hr tablet TAKE 1 TABLET BY MOUTH EVERY DAY   Omega-3 Fatty Acids (FISH OIL) 1200 MG CAPS Take 1,200 mg by mouth daily.   pantoprazole  (PROTONIX ) 40 MG tablet TAKE 1 TABLET BY MOUTH EVERY DAY   rosuvastatin  (CRESTOR ) 40 MG tablet TAKE 1 TABLET BY MOUTH EVERY DAY   No facility-administered medications prior to visit.    Review of Systems  Constitutional: Negative.   HENT: Negative.    Eyes: Negative.   Respiratory: Negative.  Negative for shortness of breath.   Cardiovascular: Negative.  Negative for chest pain.  Gastrointestinal: Negative.  Negative for abdominal pain, constipation and diarrhea.  Genitourinary: Negative.   Musculoskeletal:  Negative for joint pain and myalgias.  Skin: Negative.   Neurological: Negative.  Negative for dizziness and headaches.  Endo/Heme/Allergies: Negative.   All other systems reviewed and are negative.      Objective:   BP 110/70   Pulse 64   Ht 5' 4 (1.626 m)   Wt 150 lb 11.2 oz (68.4 kg)   SpO2 98%   BMI 25.87 kg/m   Vitals:   11/15/24 1526  BP: 110/70  Pulse: 64   Height: 5' 4 (1.626 m)  Weight: 150 lb 11.2 oz (68.4 kg)  SpO2: 98%  BMI (Calculated): 25.85    Physical Exam Vitals and nursing note reviewed.  Constitutional:      Appearance: Normal appearance. She is normal weight.  HENT:     Head: Normocephalic and atraumatic.     Nose: Nose normal.     Mouth/Throat:     Mouth: Mucous membranes are moist.  Eyes:     Extraocular Movements: Extraocular movements intact.     Conjunctiva/sclera: Conjunctivae normal.     Pupils: Pupils are equal, round, and reactive to light.  Cardiovascular:     Rate and Rhythm: Normal rate and regular rhythm.     Pulses: Normal pulses.     Heart sounds: Normal heart sounds.  Pulmonary:     Effort: Pulmonary effort is normal.     Breath sounds: Normal breath sounds.  Abdominal:     General: Abdomen is flat. Bowel sounds are normal.     Palpations: Abdomen is soft.  Musculoskeletal:        General: Normal range of motion.     Cervical back: Normal range of motion.  Skin:    General: Skin is warm and dry.  Neurological:     General: No focal deficit present.     Mental Status: She is alert and oriented to person, place, and time.  Psychiatric:        Mood and Affect: Mood normal.        Behavior: Behavior normal.        Thought Content: Thought content normal.        Judgment: Judgment normal.      No results found for any visits on 11/15/24.  Recent Results (from the past 2160 hours)  Hemoglobin A1c     Status: Abnormal   Collection Time: 11/09/24  8:33  AM  Result Value Ref Range   Hgb A1c MFr Bld 7.4 (H) 4.8 - 5.6 %    Comment:          Prediabetes: 5.7 - 6.4          Diabetes: >6.4          Glycemic control for adults with diabetes: <7.0    Est. average glucose Bld gHb Est-mCnc 166 mg/dL  TSH     Status: None   Collection Time: 11/09/24  8:33 AM  Result Value Ref Range   TSH 1.360 0.450 - 4.500 uIU/mL  CMP14+EGFR     Status: Abnormal   Collection Time: 11/09/24  8:33 AM  Result  Value Ref Range   Glucose 122 (H) 70 - 99 mg/dL   BUN 14 6 - 24 mg/dL   Creatinine, Ser 9.38 0.57 - 1.00 mg/dL   eGFR 894 >40 fO/fpw/8.26   BUN/Creatinine Ratio 23 9 - 23   Sodium 143 134 - 144 mmol/L   Potassium 3.8 3.5 - 5.2 mmol/L   Chloride 103 96 - 106 mmol/L   CO2 20 20 - 29 mmol/L   Calcium  9.7 8.7 - 10.2 mg/dL   Total Protein 6.4 6.0 - 8.5 g/dL   Albumin 4.5 3.8 - 4.9 g/dL   Globulin, Total 1.9 1.5 - 4.5 g/dL   Bilirubin Total 0.7 0.0 - 1.2 mg/dL   Alkaline Phosphatase 77 49 - 135 IU/L   AST 14 0 - 40 IU/L   ALT 16 0 - 32 IU/L  Lipid panel     Status: Abnormal   Collection Time: 11/09/24  8:33 AM  Result Value Ref Range   Cholesterol, Total 129 100 - 199 mg/dL   Triglycerides 831 (H) 0 - 149 mg/dL   HDL 43 >60 mg/dL   VLDL Cholesterol Cal 28 5 - 40 mg/dL   LDL Chol Calc (NIH) 58 0 - 99 mg/dL   Chol/HDL Ratio 3.0 0.0 - 4.4 ratio    Comment:                                   T. Chol/HDL Ratio                                             Men  Women                               1/2 Avg.Risk  3.4    3.3                                   Avg.Risk  5.0    4.4                                2X Avg.Risk  9.6    7.1                                3X Avg.Risk 23.4   11.0       Assessment & Plan:  Follow strict diabetic diet to lower A1c  Urine micro today Continue current medications  Problem List Items Addressed This Visit       Cardiovascular and Mediastinum   Coronary artery disease involving native coronary artery of native heart without angina pectoris (Chronic)   Hypertension associated with diabetes (HCC)   Relevant Orders   POCT Urine Albumin/Creatinine with ratio [[id number]]     Endocrine   Type 2 diabetes mellitus with hyperglycemia, without long-term current use of insulin (HCC) - Primary (Chronic)   Combined hyperlipidemia associated with type 2 diabetes mellitus (HCC)     Other   Invasive ductal carcinoma of breast, female, left (HCC) (Chronic)     Return in about 4 months (around 03/16/2025) for fasting lab work prior.   Total time spent: 25 minutes. This time includes review of previous notes and results and patient face to face interaction during today's visit.    Jeoffrey Pollen, NP  11/15/2024   This document may have been prepared by Dragon Voice Recognition software and as such may include unintentional dictation errors.

## 2024-11-16 ENCOUNTER — Ambulatory Visit: Payer: Self-pay | Admitting: Cardiology

## 2024-11-16 LAB — POCT UA - MICROALBUMIN
Albumin/Creatinine Ratio, Urine, POC: <30
Creatinine, POC: 50 mg/dL
Microalbumin Ur, POC: 10 mg/L

## 2024-12-02 ENCOUNTER — Other Ambulatory Visit: Payer: Self-pay | Admitting: Cardiovascular Disease

## 2024-12-02 DIAGNOSIS — I10 Essential (primary) hypertension: Secondary | ICD-10-CM

## 2024-12-02 DIAGNOSIS — I2510 Atherosclerotic heart disease of native coronary artery without angina pectoris: Secondary | ICD-10-CM

## 2024-12-02 DIAGNOSIS — E785 Hyperlipidemia, unspecified: Secondary | ICD-10-CM

## 2024-12-02 DIAGNOSIS — I2089 Other forms of angina pectoris: Secondary | ICD-10-CM

## 2024-12-02 DIAGNOSIS — E7800 Pure hypercholesterolemia, unspecified: Secondary | ICD-10-CM

## 2024-12-03 ENCOUNTER — Other Ambulatory Visit: Payer: Self-pay | Admitting: Oncology

## 2024-12-05 ENCOUNTER — Encounter: Payer: Self-pay | Admitting: Oncology

## 2024-12-06 ENCOUNTER — Telehealth: Payer: Self-pay

## 2024-12-06 ENCOUNTER — Encounter: Payer: Self-pay | Admitting: Oncology

## 2024-12-06 ENCOUNTER — Inpatient Hospital Stay: Attending: Oncology

## 2024-12-06 ENCOUNTER — Ambulatory Visit: Admitting: Oncology

## 2024-12-06 ENCOUNTER — Other Ambulatory Visit: Payer: Self-pay | Admitting: Oncology

## 2024-12-06 VITALS — BP 124/84 | HR 70 | Temp 97.8°F | Resp 18 | Ht 64.0 in | Wt 156.6 lb

## 2024-12-06 DIAGNOSIS — C50912 Malignant neoplasm of unspecified site of left female breast: Secondary | ICD-10-CM

## 2024-12-06 DIAGNOSIS — Z923 Personal history of irradiation: Secondary | ICD-10-CM | POA: Insufficient documentation

## 2024-12-06 DIAGNOSIS — Z79811 Long term (current) use of aromatase inhibitors: Secondary | ICD-10-CM | POA: Insufficient documentation

## 2024-12-06 DIAGNOSIS — Z9221 Personal history of antineoplastic chemotherapy: Secondary | ICD-10-CM | POA: Insufficient documentation

## 2024-12-06 DIAGNOSIS — Z170 Estrogen receptor positive status [ER+]: Secondary | ICD-10-CM | POA: Insufficient documentation

## 2024-12-06 DIAGNOSIS — Z803 Family history of malignant neoplasm of breast: Secondary | ICD-10-CM | POA: Insufficient documentation

## 2024-12-06 DIAGNOSIS — C50312 Malignant neoplasm of lower-inner quadrant of left female breast: Secondary | ICD-10-CM | POA: Insufficient documentation

## 2024-12-06 LAB — CMP (CANCER CENTER ONLY)
ALT: 29 U/L (ref 0–44)
AST: 21 U/L (ref 15–41)
Albumin: 4.7 g/dL (ref 3.5–5.0)
Alkaline Phosphatase: 72 U/L (ref 38–126)
Anion gap: 13 (ref 5–15)
BUN: 15 mg/dL (ref 6–20)
CO2: 23 mmol/L (ref 22–32)
Calcium: 9.9 mg/dL (ref 8.9–10.3)
Chloride: 103 mmol/L (ref 98–111)
Creatinine: 0.69 mg/dL (ref 0.44–1.00)
GFR, Estimated: >60 mL/min
Glucose, Bld: 123 mg/dL — ABNORMAL HIGH (ref 70–99)
Potassium: 3.8 mmol/L (ref 3.5–5.1)
Sodium: 139 mmol/L (ref 135–145)
Total Bilirubin: 0.6 mg/dL (ref 0.0–1.2)
Total Protein: 7 g/dL (ref 6.5–8.1)

## 2024-12-06 LAB — CBC WITH DIFFERENTIAL (CANCER CENTER ONLY)
Abs Immature Granulocytes: 0.01 K/uL (ref 0.00–0.07)
Basophils Absolute: 0 K/uL (ref 0.0–0.1)
Basophils Relative: 1 %
Eosinophils Absolute: 0.1 K/uL (ref 0.0–0.5)
Eosinophils Relative: 3 %
HCT: 38.8 % (ref 36.0–46.0)
Hemoglobin: 13.1 g/dL (ref 12.0–15.0)
Immature Granulocytes: 0 %
Lymphocytes Relative: 24 %
Lymphs Abs: 1 K/uL (ref 0.7–4.0)
MCH: 26.8 pg (ref 26.0–34.0)
MCHC: 33.8 g/dL (ref 30.0–36.0)
MCV: 79.5 fL — ABNORMAL LOW (ref 80.0–100.0)
Monocytes Absolute: 0.6 K/uL (ref 0.1–1.0)
Monocytes Relative: 14 %
Neutro Abs: 2.5 K/uL (ref 1.7–7.7)
Neutrophils Relative %: 58 %
Platelet Count: 183 K/uL (ref 150–400)
RBC: 4.88 MIL/uL (ref 3.87–5.11)
RDW: 15.5 % (ref 11.5–15.5)
WBC Count: 4.3 K/uL (ref 4.0–10.5)
nRBC: 0 % (ref 0.0–0.2)

## 2024-12-06 NOTE — Assessment & Plan Note (Addendum)
 Left breast invasive ductal carcinoma, pT1b cN0 ER 99%+/PR 30%+, HER2 low (1+), Oncotype DX RS 28, >15% chemotherapy benefit.  Status post adjuvant chemotherapy with TC Q3 weeks x 4 cycles.  Status post adjuvant breast radiation. Currently on adjuvant endocrine therapy.   Labs are reviewed and discussed with patient. Continue Arimidex    1 mg daily. 10/11/2024 DEXA normal  Option of adjuvant Zometa was discussed with patient.  Patient elects no Zometa due to concern of side effects. Repeat bilateral diagnostic mammogram in March 2026

## 2024-12-06 NOTE — Assessment & Plan Note (Addendum)
 Recommend patient to take calcium  and vitamin D supplementation.   Patient has normal baseline DEXA

## 2024-12-06 NOTE — Progress Notes (Signed)
 " Hematology/Oncology Progress note [phone] [phone]      CHIEF COMPLAINTS/PURPOSE OF CONSULTATION:  Left breast cancer  ASSESSMENT & PLAN:   Cancer Staging  Invasive ductal carcinoma of breast, female, left (HCC) Staging form: Breast, AJCC 8th Edition - Clinical stage from 03/08/2024: Stage IA (cT1b, cN0, cM0, G2, ER+, PR+, HER2-) - Signed by Babara Call, MD on 03/08/2024 - Pathologic stage from 04/07/2024: Stage IA (pT1c, pN0, cM0, G3, ER+, PR+, HER2-, Oncotype DX score: 28) - Signed by Babara Call, MD on 04/07/2024   Invasive ductal carcinoma of breast, female, left (HCC) Left breast invasive ductal carcinoma, pT1b cN0 ER 99%+/PR 30%+, HER2 low (1+), Oncotype DX RS 28, >15% chemotherapy benefit.  Status post adjuvant chemotherapy with TC Q3 weeks x 4 cycles.  Status post adjuvant breast radiation. Currently on adjuvant endocrine therapy.   Labs are reviewed and discussed with patient. Continue Arimidex    1 mg daily. 10/11/2024 DEXA normal  Option of adjuvant Zometa was discussed with patient.  Patient elects no Zometa due to concern of side effects. Repeat bilateral diagnostic mammogram in March 2026  Aromatase inhibitor use Recommend patient to take calcium  and vitamin D supplementation.   Patient has normal baseline DEXA   Orders Placed This Encounter  Procedures   MM 3D DIAGNOSTIC MAMMOGRAM BILATERAL BREAST    Standing Status:   Future    Expected Date:   02/15/2025    Expiration Date:   12/06/2025    Reason for Exam (SYMPTOM  OR DIAGNOSIS REQUIRED):   breast cancer    Is the patient pregnant?:   No    Preferred imaging location?:   [facility]   CBC with Differential (Cancer Center Only)    Standing Status:   Future    Expected Date:   06/06/2025    Expiration Date:   09/04/2025   CMP (Cancer Center only)    Standing Status:   Future    Expected Date:   06/06/2025    Expiration Date:   09/04/2025   Follow up 6 months  All questions were  answered. The patient knows to call the clinic with any problems, questions or concerns.  We spent sufficient time to discuss many aspect of care, questions were answered to patient's satisfaction.   Call Babara, MD, PhD [facility] Hematology Oncology 12/06/2024    HISTORY OF PRESENTING ILLNESS:  Zoe [name] 58 y.o. female presents to establish care for left breast cancer I have reviewed her chart and materials related to her cancer extensively and collaborated history with the patient. Summary of oncologic history is as follows: Oncology History  Invasive ductal carcinoma of breast, female, left (HCC)  02/17/2024 Mammogram   Bilateral screening mammogram showed  FINDINGS: In the left breast, a possible mass warrants further evaluation. In the right breast, no findings suspicious for malignancy.   IMPRESSION: Further evaluation is suggested for a possible mass in the left breast.    02/24/2024 Mammogram   Unilateral left breast diagnostic mammogram  1. There is an 8 mm mass at the site of screening mammographic concern which is highly suspicious for malignancy. Recommend ultrasound-guided biopsy for definitive characterization. 2. No suspicious LEFT axillary adenopathy.   03/08/2024 Initial Diagnosis   Invasive ductal carcinoma of breast, female, left   Patient underwent left breast mass biopsy Pathology showed  1. Breast, left, needle core biopsy, 8 o'clock, 8cmfn :       - INVASIVE DUCTAL CARCINOMA, GRADE 2, EXTENDING TO 0.6 CM IN  MAXIMUM EXTENT, AND       INVOLVING TWO OF TWO BIOPSY FRAGMENTS.  SEE COMMENT.       - TUBULE FORMATION: SCORE 3       - NUCLEAR PLEOMORPHISM: SCORE 2       - MITOTIC COUNT: SCORE 1       - TOTAL SCORE: 6       - OVERALL GRADE:  2       - LYMPHOVASCULAR INVASION: NOT IDENTIFIED       - CANCER LENGTH: 6 MM       - CALCIFICATIONS: NOT IDENTIFIED       - LYMPHOVASCULAR INVASION: NOT IDENTIFIED.   ER + 99% PR + 30%, Her2  negative (1+)     Menarche at age of 22 First live birth at age of 54 OCP use: >5 years, then switched to IUD History of hysterectomy: no Menopausal status: unsure History of HRT use: No History of chest radiation: no  Number of previous breast biopsies:  no  Denies family history of cancer, except that her father's cousin has breast cancer.    03/08/2024 Cancer Staging   Staging form: Breast, AJCC 8th Edition - Clinical stage from 03/08/2024: Stage IA (cT1b, cN0, cM0, G2, ER+, PR+, HER2-) - Signed by Babara Call, MD on 03/08/2024 Stage prefix: Initial diagnosis Histologic grading system: 3 grade system   03/17/2024 Surgery   Patient underwent left breast lumpectomy and SLNB  1. Breast, lumpectomy, left breast mass :       - INVASIVE MAMMARY CARCINOMA OF NO SPECIAL TYPE (DUCTAL).       - DUCTAL CARCINOMA IN SITU (DCIS).       - SEE CANCER SUMMARY BELOW.       - PRIOR BIOPSY SITE CHANGE WITH CLIP.       - SCOUT TAG IS PRESENT.        2. Lymph node, sentinel, biopsy, left axillary sentinel lymph node #1 :       - ONE LYMPH NODE NEGATIVE FOR MALIGNANCY (0/1).        3. Lymph node, sentinel, biopsy, left axillary sentinel lymph node #2 :       - ONE LYMPH NODE NEGATIVE FOR MALIGNANCY (0/1).    TUMOR Histologic Type: Invasive carcinoma of no special type (ductal) Histologic Grade (Nottingham Histologic Score) Glandular (Acinar)/Tubular Differentiation: 3 Nuclear Pleomorphism: 2 Mitotic Rate: 3 Overall Grade: 3 Tumor Size: Greatest dimension of largest invasive focus: 12 mm Ductal Carcinoma In Situ (DCIS): Present, intermediate to high-grade Tumor Extent: Not applicable Lymphatic and/or Vascular Invasion: Not identified Treatment Effect in the Breast: No known presurgical therapy  MARGINS Margin Status for Invasive Carcinoma: All margins negative for invasive carcinoma Distance from closest margin: 10 mm Specify closest margin: Anterior Margin Status for DCIS: All margins negative for  DCIS Distance from DCIS to closest margin: 0.1 mm Specify closest margin: Anterior                      REGIONAL LYMPH NODES Regional Lymph Node Status: All regional lymph nodes negative for tumor Total Number of Lymph Nodes Examined (sentinel and non-sentinel): 2 Number of Sentinel Nodes Examined: 2  PATHOLOGIC STAGE CLASSIFICATION (pTNM, AJCC 8th Edition):  Modified Classification: Not applicable  pT Category: pT1c  T Suffix: Not applicable  pN Category: pN0  N Suffix: (sn)  pM Category: Not applicable     04/07/2024 Cancer Staging   Staging form: Breast, AJCC 8th Edition -  Pathologic stage from 04/07/2024: Stage IA (pT1c, pN0, cM0, G3, ER+, PR+, HER2-, Oncotype DX score: 28) - Signed by Babara Call, MD on 04/07/2024 Stage prefix: Initial diagnosis Multigene prognostic tests performed: Oncotype DX Recurrence score range: Greater than or equal to 11 Histologic grading system: 3 grade system   04/21/2024 -  Chemotherapy   Patient is on Treatment Plan : BREAST TC q21d      Genetic Testing   Negative genetic testing. No pathogenic variants identified on the [employer]. The report date is 05/24/2024.  The [employer] [facility] Panel includes sequencing, rearrangement analysis, and RNA analysis for the following 40 genes: APC, ATM, BAP1, BARD1, BMPR1A, BRCA1, BRCA2, BRIP1, CDH1, CDKN2A, CHEK2, FH, FLCN, MET, MLH1, MSH2, MSH6, MUTYH, NF1, NTHL1, PALB2, PMS2, PTEN, RAD51C, RAD51D, RPS20, SMAD4, STK11, TP53, TSC1, TSC2, and VHL (sequencing and deletion/duplication); AXIN2, HOXB13, MBD4, MSH3, POLD1 and POLE (sequencing only); EPCAM and GREM1 (deletion/duplication only).   07/14/2024 - 08/17/2024 Radiation Therapy   Patient's status post adjuvant breast radiation.   09/06/2024 -  Anti-estrogen oral therapy   Patient is on Arimidex  1 mg daily.    Patient reports feeling well today.   Patient tolerates Arimidex  1 mg daily.  She reports some mood swings but overall manageable.  MEDICAL  HISTORY:  Past Medical History:  Diagnosis Date   Allergic rhinitis    Asthma    Complication of anesthesia    a.) delayed/prolonged emergence   Coronary artery disease    Dysplastic nevi 08/30/2020   Right lateral distal deltoid, left lower back post waistline. 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   T2DM (type 2 diabetes mellitus) (HCC)    Unstable angina (HCC)     SURGICAL HISTORY: Past Surgical History:  Procedure Laterality Date   AXILLARY SENTINEL NODE BIOPSY Left 03/17/2024   Procedure: BIOPSY, LYMPH NODE, SENTINEL, AXILLARY;  Surgeon: Rodolph Romano, MD;  Location: [facility];  Service: General;  Laterality: Left;   BREAST BIOPSY Left 03/03/2024   US  LT BREAST Bx venus marker,   8:00 8 cmfn : - INVASIVE DUCTAL CARCINOMA   BREAST BIOPSY Left 03/11/2024   US  LT RADIO FREQUENCY TAG LOC US  GUIDE 03/11/2024 [facility]   BREAST LUMPECTOMY Left 03/17/2024   Procedure: BREAST LUMPECTOMY;  Surgeon: Rodolph Romano, MD;  Location: [facility];  Service: General;  Laterality: Left;  w/ RF tag   CARDIAC CATHETERIZATION     COLONOSCOPY  2001   COLONOSCOPY WITH PROPOFOL  N/A 11/05/2019   Procedure: COLONOSCOPY WITH PROPOFOL ;  Surgeon: Janalyn Keene NOVAK, MD;  Location: [facility];  Service: Endoscopy;  Laterality: N/A;   CORONARY ANGIOPLASTY     DILATATION & CURETTAGE/HYSTEROSCOPY WITH MYOSURE N/A  02/29/2016   Procedure: DILATATION & CURETTAGE/HYSTEROSCOPY WITH MYOSURE;  Surgeon: Mitzie BROCKS Ward, MD;  Location: [facility];  Service: Gynecology;  Laterality: N/A;   DILATION AND CURETTAGE OF UTERUS  EAR CYST EXCISION Left 2015   IN OFFICE   ESOPHAGOGASTRODUODENOSCOPY     INTRAUTERINE DEVICE (IUD) INSERTION N/A 02/29/2016   Procedure: INTRAUTERINE DEVICE (IUD) INSERTION;  Surgeon: Mitzie BROCKS Ward, MD;  Location: [facility];  Service: Gynecology;  Laterality: N/A;   LEFT HEART CATH AND CORONARY ANGIOGRAPHY Left 03/16/2019   Procedure: LEFT HEART CATH AND CORONARY ANGIOGRAPHY;  Surgeon: Fernand Denyse LABOR, MD;  Location: [facility];  Service: Cardiovascular;  Laterality: Left;    SOCIAL HISTORY: Social History   Socioeconomic History   Marital status: Married    Spouse name: Longley,Bobby T (Spouse)   Number of children: 2   Years of education: 18   Highest education level: Not on file  Occupational History   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  Other Topics Concern   Not on file  Social History Narrative   Not on file   Social Drivers of Health   Tobacco Use: Low Risk (12/06/2024)   Patient History    Smoking Tobacco Use: Never    Smokeless Tobacco Use: Never    Passive Exposure: Not on file  Financial Resource Strain: Low Risk  (03/09/2024)   Received from [facility]   Overall Financial Resource Strain (CARDIA)    Difficulty of Paying Living Expenses: Not hard at all  Food Insecurity: No Food Insecurity (03/09/2024)   Received from [facility]   Epic     Within the past 12 months, you worried that your food would run out before you got the money to buy more.: Never true    Within the past 12 months, the food you bought just didn't last and you didn't have money to get more.: Never true  Transportation Needs: No Transportation Needs (03/09/2024)   Received from [facility] - Transportation    In the past 12 months, has lack of transportation kept you from medical appointments or from getting medications?: No    Lack of Transportation (Non-Medical): No  Physical Activity: Not on file  Stress: Not on file  Social Connections: Not on file  Intimate Partner Violence: Not At Risk (03/08/2024)   Humiliation, Afraid, Rape, and Kick questionnaire    Fear of Current or Ex-Partner: No    Emotionally Abused: No    Physically Abused: No    Sexually Abused: No  Depression (PHQ2-9): Low Risk (07/06/2024)   Depression (PHQ2-9)    PHQ-2 Score: 0  Alcohol Screen: Not on file  Housing: Low Risk  (04/29/2024)   Received from [facility]   Epic    In the last 12 months, was there a time when you were not able to pay the mortgage or rent on time?: No    In the past 12 months, how many times have you moved where you were living?: 0    At any time in the past 12 months, were you homeless or living in a shelter (including now)?: No  Utilities: Not At Risk (03/09/2024)   Received from [facility] Utilities    Threatened with loss of utilities: No  Health Literacy: Adequate Health Literacy (03/08/2024)   B1300 Health Literacy    Frequency of need for help with medical instructions: Never    FAMILY HISTORY: Family History  Problem Relation Age of Onset   Hyperlipidemia Mother    Hypertension Mother    Heart disease Father        died from either MI or stroke   Diabetes Brother        TYPE 2   Heart disease Paternal Grandfather    Diabetes Other        TYPE 2   Cancer Neg Hx     Breast cancer Neg Hx     ALLERGIES:  is allergic to ace inhibitors and neosporin wound cleanser [benzalkonium chloride].  MEDICATIONS:  Current Outpatient Medications  Medication Sig Dispense Refill   anastrozole  (ARIMIDEX ) 1 MG tablet TAKE 1 TABLET BY MOUTH EVERY DAY 90 tablet 1   aspirin  81 MG tablet Take 81 mg by mouth daily.     Cholecalciferol (VITAMIN D-3 PO) Take 2,000 Units by mouth daily.     clopidogrel  (PLAVIX ) 75 MG tablet TAKE 1 TABLET BY MOUTH EVERY DAY 90 tablet 1   Coenzyme Q10 (CO Q 10 PO) Take 1 capsule by mouth daily.     FARXIGA 10 MG TABS tablet TAKE 1 TABLET BY MOUTH EVERY DAY IN THE MORNING 30 tablet 11   ibuprofen  (ADVIL ,MOTRIN ) 600 MG tablet Take 1 tablet (600 mg total) by mouth every 6 (six) hours as needed. 30 tablet 0   isosorbide  mononitrate (IMDUR ) 30 MG 24 hr tablet TAKE 1 TABLET BY MOUTH EVERY DAY 90 tablet 2   losartan-hydrochlorothiazide  (HYZAAR) 50-12.5 MG tablet TAKE 1 TABLET BY MOUTH EVERY DAY 90 tablet 0   metFORMIN  (GLUCOPHAGE ) 500 MG tablet TAKE 1 TABLET BY MOUTH 2 TIMES DAILY WITH A MEAL. 180 tablet 1   metoprolol  succinate (TOPROL -XL) 50 MG 24 hr tablet TAKE 1 TABLET BY MOUTH EVERY DAY 90 tablet 2   Omega-3 Fatty Acids (FISH OIL) 1200 MG CAPS Take 1,200 mg by mouth daily.     pantoprazole  (PROTONIX ) 40 MG tablet TAKE 1 TABLET BY MOUTH EVERY DAY 90 tablet 1   rosuvastatin  (CRESTOR ) 40 MG tablet TAKE 1 TABLET BY MOUTH EVERY DAY 90 tablet 1   No current facility-administered medications for this visit.    Review of Systems  Constitutional:  Positive for fatigue. Negative for appetite change, chills and fever.  HENT:   Negative for hearing loss and voice change.   Eyes:  Negative for eye problems.  Respiratory:  Negative for chest tightness and cough.   Cardiovascular:  Negative for chest pain.  Gastrointestinal:  Negative for abdominal distention, abdominal pain and blood in stool.  Endocrine: Negative for hot flashes.  Genitourinary:  Negative  for difficulty urinating and frequency.   Musculoskeletal:  Negative for arthralgias.  Skin:  Negative for itching and rash.  Neurological:  Negative for extremity weakness.  Hematological:  Negative for adenopathy.  Psychiatric/Behavioral:  Negative for confusion.      PHYSICAL EXAMINATION: ECOG PERFORMANCE STATUS: 0 - Asymptomatic  Vitals:   12/06/24 1113  BP: 124/84  Pulse: 70  Resp: 18  Temp: 97.8 F (36.6 C)  SpO2: 98%   Filed Weights   12/06/24 1113  Weight: 156 lb 9.6 oz (71 kg)  Physical Exam Constitutional:      General: She is not in acute distress.    Appearance: She is not diaphoretic.  HENT:     Head: Normocephalic and atraumatic.     Nose: Nose normal.     Mouth/Throat:     Pharynx: No oropharyngeal exudate.  Eyes:     General: No scleral icterus.    Pupils: Pupils are equal, round, and reactive to light.  Cardiovascular:     Rate and Rhythm: Normal rate and regular rhythm.     Heart sounds: No murmur heard. Pulmonary:     Effort: Pulmonary effort is normal. No respiratory distress.     Breath sounds: Normal breath sounds. No wheezing.  Abdominal:     General: There is no distension.     Palpations: Abdomen is soft.     Tenderness: There is no abdominal tenderness.  Musculoskeletal:        General: Normal range of motion.     Cervical back: Normal range of motion and neck supple.  Skin:    General: Skin is warm and dry.     Findings: No erythema.  Neurological:     Mental Status: She is alert and oriented to person, place, and time. Mental status is at baseline.     Motor: No abnormal muscle tone.  Psychiatric:        Mood and Affect: Mood and affect normal.     LABORATORY DATA:  I have reviewed the data as listed    Latest Ref Rng & Units 12/06/2024   10:36 AM 08/04/2024   10:40 AM 07/28/2024   10:01 AM  CBC  WBC 4.0 - 10.5 K/uL 4.3  3.9  5.7   Hemoglobin 12.0 - 15.0 g/dL 86.8  87.9  87.8   Hematocrit 36.0 - 46.0 % 38.8  36.6   37.1   Platelets 150 - 400 K/uL 183  175  270       Latest Ref Rng & Units 12/06/2024   10:36 AM 11/09/2024    8:33 AM 07/06/2024    9:02 AM  CMP  Glucose 70 - 99 mg/dL 876  877  881   BUN 6 - 20 mg/dL 15  14  11    Creatinine 0.44 - 1.00 mg/dL 9.30  9.38  9.48   Sodium 135 - 145 mmol/L 139  143  145   Potassium 3.5 - 5.1 mmol/L 3.8  3.8  3.9   Chloride 98 - 111 mmol/L 103  103  107   CO2 22 - 32 mmol/L 23  20  18    Calcium  8.9 - 10.3 mg/dL 9.9  9.7  9.1   Total Protein 6.5 - 8.1 g/dL 7.0  6.4  6.1   Total Bilirubin 0.0 - 1.2 mg/dL 0.6  0.7  0.5   Alkaline Phos 38 - 126 U/L 72  77  130   AST 15 - 41 U/L 21  14  16    ALT 0 - 44 U/L 29  16  15       RADIOGRAPHIC STUDIES: I have personally reviewed the radiological images as listed and agreed with the findings in the report. No results found.   "

## 2024-12-06 NOTE — Progress Notes (Signed)
 Patient told me the name of her dentist is [facility] in [place], so I will send over a dental clearance form, she says that her dentist appointment is for the 30 th. She is doing well.

## 2024-12-06 NOTE — Telephone Encounter (Signed)
 Faxed over patients dental clearance form for her to start on the Zometa. The name of the dentist is [facility] in [place].

## 2024-12-11 ENCOUNTER — Other Ambulatory Visit: Payer: Self-pay | Admitting: Cardiovascular Disease

## 2024-12-11 DIAGNOSIS — I10 Essential (primary) hypertension: Secondary | ICD-10-CM

## 2024-12-14 ENCOUNTER — Telehealth: Payer: Self-pay

## 2024-12-14 NOTE — Telephone Encounter (Signed)
 Dental clearance request for Zometa faxed to [facility] in [place].   Fax: [phone] Ph: [phone]

## 2024-12-28 ENCOUNTER — Encounter: Payer: Self-pay | Admitting: Oncology

## 2025-02-03 ENCOUNTER — Inpatient Hospital Stay: Admitting: *Deleted

## 2025-02-15 ENCOUNTER — Other Ambulatory Visit

## 2025-02-15 ENCOUNTER — Encounter

## 2025-02-24 ENCOUNTER — Ambulatory Visit: Admitting: Radiation Oncology

## 2025-02-24 ENCOUNTER — Ambulatory Visit: Admitting: Cardiovascular Disease

## 2025-03-17 ENCOUNTER — Ambulatory Visit: Admitting: Family

## 2025-03-17 ENCOUNTER — Ambulatory Visit: Admitting: Cardiology

## 2025-06-08 ENCOUNTER — Inpatient Hospital Stay

## 2025-06-08 ENCOUNTER — Inpatient Hospital Stay: Admitting: Oncology

## 2025-08-09 ENCOUNTER — Ambulatory Visit: Admitting: Dermatology
